# Patient Record
Sex: Female | Born: 1990 | Race: White | Hispanic: No | Marital: Single | Smoking: Never smoker
Health system: Southern US, Community
[De-identification: ages and names within clinical notes are randomized; demographics above are authoritative.]

## PROBLEM LIST (undated history)

## (undated) ENCOUNTER — Inpatient Hospital Stay (HOSPITAL_COMMUNITY): Payer: Self-pay

## (undated) DIAGNOSIS — R011 Cardiac murmur, unspecified: Secondary | ICD-10-CM

## (undated) DIAGNOSIS — F419 Anxiety disorder, unspecified: Secondary | ICD-10-CM

## (undated) DIAGNOSIS — B999 Unspecified infectious disease: Secondary | ICD-10-CM

## (undated) DIAGNOSIS — F32A Depression, unspecified: Secondary | ICD-10-CM

## (undated) HISTORY — PX: NO PAST SURGERIES: SHX2092

## (undated) HISTORY — DX: Depression, unspecified: F32.A

## (undated) HISTORY — PX: WISDOM TOOTH EXTRACTION: SHX21

---
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
## 2009-05-06 ENCOUNTER — Emergency Department (HOSPITAL_COMMUNITY): Admission: EM | Admit: 2009-05-06 | Discharge: 2009-05-06 | Payer: Self-pay | Admitting: Emergency Medicine

## 2009-06-18 ENCOUNTER — Emergency Department (HOSPITAL_BASED_OUTPATIENT_CLINIC_OR_DEPARTMENT_OTHER): Admission: EM | Admit: 2009-06-18 | Discharge: 2009-06-18 | Payer: Self-pay | Admitting: Emergency Medicine

## 2009-10-17 ENCOUNTER — Emergency Department (HOSPITAL_BASED_OUTPATIENT_CLINIC_OR_DEPARTMENT_OTHER): Admission: EM | Admit: 2009-10-17 | Discharge: 2009-10-17 | Payer: Self-pay | Admitting: Emergency Medicine

## 2009-10-17 ENCOUNTER — Ambulatory Visit: Payer: Self-pay | Admitting: Diagnostic Radiology

## 2009-12-18 ENCOUNTER — Emergency Department (HOSPITAL_BASED_OUTPATIENT_CLINIC_OR_DEPARTMENT_OTHER): Admission: EM | Admit: 2009-12-18 | Discharge: 2009-12-18 | Payer: Self-pay | Admitting: Emergency Medicine

## 2010-03-05 ENCOUNTER — Emergency Department (HOSPITAL_BASED_OUTPATIENT_CLINIC_OR_DEPARTMENT_OTHER): Admission: EM | Admit: 2010-03-05 | Discharge: 2010-03-05 | Payer: Self-pay | Admitting: Emergency Medicine

## 2010-07-05 ENCOUNTER — Emergency Department (HOSPITAL_BASED_OUTPATIENT_CLINIC_OR_DEPARTMENT_OTHER): Admission: EM | Admit: 2010-07-05 | Discharge: 2010-07-05 | Payer: Self-pay | Admitting: Emergency Medicine

## 2010-07-10 ENCOUNTER — Emergency Department (HOSPITAL_BASED_OUTPATIENT_CLINIC_OR_DEPARTMENT_OTHER): Admission: EM | Admit: 2010-07-10 | Discharge: 2010-07-10 | Payer: Self-pay | Admitting: Emergency Medicine

## 2010-11-05 LAB — URINALYSIS, ROUTINE W REFLEX MICROSCOPIC
Bilirubin Urine: NEGATIVE
Glucose, UA: NEGATIVE mg/dL
Hgb urine dipstick: NEGATIVE
Ketones, ur: NEGATIVE mg/dL
Nitrite: NEGATIVE
Nitrite: NEGATIVE
Protein, ur: NEGATIVE mg/dL
Specific Gravity, Urine: 1.018 (ref 1.005–1.030)
Specific Gravity, Urine: 1.026 (ref 1.005–1.030)
Urobilinogen, UA: 0.2 mg/dL (ref 0.0–1.0)
Urobilinogen, UA: 0.2 mg/dL (ref 0.0–1.0)
pH: 6.5 (ref 5.0–8.0)

## 2010-11-05 LAB — URINE CULTURE

## 2010-11-05 LAB — PREGNANCY, URINE
Preg Test, Ur: NEGATIVE
Preg Test, Ur: NEGATIVE

## 2010-11-05 LAB — GC/CHLAMYDIA PROBE AMP, GENITAL: Chlamydia, DNA Probe: NEGATIVE

## 2010-11-05 LAB — URINE MICROSCOPIC-ADD ON

## 2010-11-12 LAB — URINALYSIS, ROUTINE W REFLEX MICROSCOPIC
Bilirubin Urine: NEGATIVE
Nitrite: NEGATIVE
Specific Gravity, Urine: 1.024 (ref 1.005–1.030)
Urobilinogen, UA: 0.2 mg/dL (ref 0.0–1.0)

## 2010-11-12 LAB — URINE MICROSCOPIC-ADD ON

## 2010-11-12 LAB — PREGNANCY, URINE: Preg Test, Ur: NEGATIVE

## 2010-11-12 LAB — WET PREP, GENITAL: Trich, Wet Prep: NONE SEEN

## 2010-11-12 LAB — GC/CHLAMYDIA PROBE AMP, GENITAL: Chlamydia, DNA Probe: NEGATIVE

## 2010-11-28 LAB — URINALYSIS, ROUTINE W REFLEX MICROSCOPIC
Bilirubin Urine: NEGATIVE
Glucose, UA: NEGATIVE mg/dL
Hgb urine dipstick: NEGATIVE
Ketones, ur: NEGATIVE mg/dL
Leukocytes, UA: NEGATIVE
Protein, ur: 100 mg/dL — AB

## 2010-11-29 LAB — POCT URINALYSIS DIP (DEVICE)
Bilirubin Urine: NEGATIVE
Glucose, UA: NEGATIVE mg/dL
Ketones, ur: NEGATIVE mg/dL
Specific Gravity, Urine: 1.02 (ref 1.005–1.030)

## 2011-05-16 LAB — DIFFERENTIAL
Lymphocytes Relative: 22 — ABNORMAL LOW
Monocytes Absolute: 0.7
Monocytes Relative: 10
Neutro Abs: 4.5
Neutrophils Relative %: 64

## 2011-05-16 LAB — POCT RAPID STREP A: Streptococcus, Group A Screen (Direct): NEGATIVE

## 2011-05-16 LAB — STREP A DNA PROBE: Group A Strep Probe: NEGATIVE

## 2011-05-16 LAB — CBC
Hemoglobin: 14.3
RBC: 4.8
WBC: 7

## 2011-05-16 LAB — POCT INFECTIOUS MONO SCREEN: Mono Screen: NEGATIVE

## 2011-05-19 LAB — POCT URINALYSIS DIP (DEVICE)
Glucose, UA: NEGATIVE
Hgb urine dipstick: NEGATIVE
Nitrite: NEGATIVE
Operator id: 247071
Protein, ur: 30 — AB
Urobilinogen, UA: 2 — ABNORMAL HIGH

## 2011-05-21 LAB — URINE CULTURE
Colony Count: NO GROWTH
Culture: NO GROWTH

## 2011-05-21 LAB — POCT URINALYSIS DIP (DEVICE)
Glucose, UA: NEGATIVE
Nitrite: NEGATIVE
Operator id: 282151
Protein, ur: 30 — AB
Specific Gravity, Urine: >=1.03
Urobilinogen, UA: 0.2
pH: 5.5

## 2011-05-21 LAB — POCT PREGNANCY, URINE: Operator id: 282151

## 2011-05-22 LAB — POCT URINALYSIS DIP (DEVICE)
Glucose, UA: NEGATIVE
Nitrite: NEGATIVE
Operator id: 247071
Specific Gravity, Urine: 1.02
Urobilinogen, UA: 0.2

## 2011-05-22 LAB — HCG, SERUM, QUALITATIVE: Preg, Serum: NEGATIVE

## 2011-05-22 LAB — POCT PREGNANCY, URINE: Preg Test, Ur: NEGATIVE

## 2011-06-03 LAB — POCT URINALYSIS DIP (DEVICE)
Nitrite: POSITIVE — AB
Urobilinogen, UA: 1
pH: 7

## 2011-06-03 LAB — URINE CULTURE

## 2011-06-03 LAB — POCT PREGNANCY, URINE: Preg Test, Ur: NEGATIVE

## 2011-06-04 LAB — URINE CULTURE: Colony Count: 70000

## 2011-06-04 LAB — POCT URINALYSIS DIP (DEVICE)
Glucose, UA: NEGATIVE
Hgb urine dipstick: NEGATIVE
Nitrite: NEGATIVE
Operator id: 247071
Urobilinogen, UA: 0.2

## 2011-06-04 LAB — URINALYSIS, ROUTINE W REFLEX MICROSCOPIC
Hgb urine dipstick: NEGATIVE
Protein, ur: NEGATIVE
Urobilinogen, UA: 0.2

## 2011-06-04 LAB — POCT PREGNANCY, URINE: Preg Test, Ur: NEGATIVE

## 2011-06-04 LAB — URINE MICROSCOPIC-ADD ON

## 2011-06-05 LAB — POCT URINALYSIS DIP (DEVICE)
Bilirubin Urine: NEGATIVE
Nitrite: NEGATIVE
Protein, ur: NEGATIVE
Urobilinogen, UA: 0.2
pH: 7.5

## 2012-06-16 ENCOUNTER — Ambulatory Visit (INDEPENDENT_AMBULATORY_CARE_PROVIDER_SITE_OTHER): Payer: Self-pay | Admitting: *Deleted

## 2012-06-16 DIAGNOSIS — I781 Nevus, non-neoplastic: Secondary | ICD-10-CM

## 2012-06-16 NOTE — Progress Notes (Signed)
Patient came in wanting sclero. She had 3 little red vessels that were tiny. I suggested she wait until they were bigger and she had more. Follow prn.

## 2012-08-21 ENCOUNTER — Emergency Department (HOSPITAL_BASED_OUTPATIENT_CLINIC_OR_DEPARTMENT_OTHER): Payer: Self-pay

## 2012-08-21 ENCOUNTER — Emergency Department (HOSPITAL_BASED_OUTPATIENT_CLINIC_OR_DEPARTMENT_OTHER)
Admission: EM | Admit: 2012-08-21 | Discharge: 2012-08-21 | Disposition: A | Discharge status: Home / Self Care | Payer: Self-pay | Attending: Emergency Medicine | Admitting: Emergency Medicine

## 2012-08-21 ENCOUNTER — Encounter (HOSPITAL_BASED_OUTPATIENT_CLINIC_OR_DEPARTMENT_OTHER): Payer: Self-pay | Admitting: *Deleted

## 2012-08-21 DIAGNOSIS — J40 Bronchitis, not specified as acute or chronic: Secondary | ICD-10-CM | POA: Insufficient documentation

## 2012-08-21 DIAGNOSIS — B349 Viral infection, unspecified: Secondary | ICD-10-CM

## 2012-08-21 DIAGNOSIS — B9789 Other viral agents as the cause of diseases classified elsewhere: Secondary | ICD-10-CM | POA: Insufficient documentation

## 2012-08-21 DIAGNOSIS — Z3202 Encounter for pregnancy test, result negative: Secondary | ICD-10-CM | POA: Insufficient documentation

## 2012-08-21 LAB — PREGNANCY, URINE: Preg Test, Ur: NEGATIVE

## 2012-08-21 LAB — RAPID STREP SCREEN (MED CTR MEBANE ONLY): Streptococcus, Group A Screen (Direct): NEGATIVE

## 2012-08-21 MED ORDER — ALBUTEROL SULFATE HFA 108 (90 BASE) MCG/ACT IN AERS
2.0000 | INHALATION_SPRAY | RESPIRATORY_TRACT | Status: DC | PRN
Start: 2012-08-21 — End: 2012-08-21
  Administered 2012-08-21: 2 via RESPIRATORY_TRACT
  Filled 2012-08-21: qty 6.7

## 2012-08-21 NOTE — ED Notes (Signed)
Pt states she got sick at Christmas and is losing her voice now. Taking Mucinex, but throat feels dry now. Cough, congestion as well.

## 2012-08-21 NOTE — ED Provider Notes (Signed)
History   This chart was scribed for Carrie Chick, MD scribed by Magnus Sinning. The patient was seen in room MH05/MH05 at 15:20    CSN: [id number]  Arrival date & time 08/21/12  1450     Chief Complaint  Patient presents with  . Sore Throat    (Consider location/radiation/quality/duration/timing/severity/associated sxs/prior treatment) HPI Comments: Carrie [name] is a 21 y.o. female who presents to the Emergency Department complaining of gradually worsening moderate ST with associated unproductive cough, watery eyes, and congestion, all onset 3 days ago. Pt provides that she has been sick for the past three days and that she believes her ST has been worsened with continued Mucinex cough syrup use. She says that she was initially having a productive cough, but states it has since become very dry.  Reports previous associated subjective fever that she says has since resolved after tylenol. Also provides that she took some of mother's amoxil rx with no relief of sxs.  Pt denies hx of asthma or smoking.   Patient is a 21 y.o. female presenting with pharyngitis. The history is provided by the patient. No language interpreter was used.  Sore Throat This is a new problem. The current episode started more than 2 days ago. The problem occurs constantly. The problem has been gradually worsening. The symptoms are relieved by medications. She has tried acetaminophen for the symptoms. The treatment provided moderate relief.    History reviewed. No pertinent past medical history.  History reviewed. No pertinent past surgical history.  History reviewed. No pertinent family history.  History  Substance Use Topics  . Smoking status: Never Smoker   . Smokeless tobacco: Not on file  . Alcohol Use: No    Review of Systems  HENT: Positive for congestion.   Respiratory: Positive for cough.   All other systems reviewed and are negative.    Allergies  Codeine  Home Medications  No  current outpatient prescriptions on file.  BP 97/64  Pulse 94  Temp 98.2 F (36.8 C) (Oral)  Resp 16  Ht 5\' 5"  (1.651 m)  Wt 135 lb (61.236 kg)  BMI 22.47 kg/m2  SpO2 97%  LMP 08/11/2012  Physical Exam  Nursing note and vitals reviewed. Constitutional: She is oriented to person, place, and time. She appears well-developed and well-nourished. No distress.       Well-appearing and well-hydrated  HENT:  Head: Normocephalic and atraumatic.  Mouth/Throat: Oropharynx is clear and moist.       Throat is not red  Eyes: Conjunctivae normal and EOM are normal.  Neck: Neck supple. No tracheal deviation present.  Cardiovascular: Normal rate, regular rhythm and normal heart sounds.   Pulmonary/Chest: Effort normal and breath sounds normal. No respiratory distress. She has no wheezes. She has no rales.  Abdominal: Soft.  Musculoskeletal: Normal range of motion. She exhibits no edema.  Neurological: She is alert and oriented to person, place, and time. No sensory deficit.  Skin: Skin is warm and dry.  Psychiatric: She has a normal mood and affect. Her behavior is normal.    ED Course  Procedures (including critical care time) DIAGNOSTIC STUDIES: Oxygen Saturation is 97% on room air, normal by my interpretation.    COORDINATION OF CARE: 15:21: Physical exam performed  Labs Reviewed  PREGNANCY, URINE  RAPID STREP SCREEN   No results found.   1. Viral infection   2. Bronchitis       MDM  Pt presenting with c/o nasal congestion, dry  throat, hoarse voice, nonproductive cough.  She has been taking mucinex  With some relief.  She has negative rapid strep.  She has declined to have CXR performed- and I have a relatively low suspicion for pneumonia, rather suspect viral infection/bronchitis and have recommended continuing symptomatic care.  Given albuterol inhaler.  Discharged with strict return precautions.  Pt agreeable with plan.   I personally performed the services described in  this documentation, which was scribed in my presence. The recorded information has been reviewed and is accurate.        Carrie Chick, MD 08/21/12 [phone]

## 2012-08-26 ENCOUNTER — Encounter (HOSPITAL_BASED_OUTPATIENT_CLINIC_OR_DEPARTMENT_OTHER): Payer: Self-pay | Admitting: *Deleted

## 2012-08-26 ENCOUNTER — Emergency Department (HOSPITAL_BASED_OUTPATIENT_CLINIC_OR_DEPARTMENT_OTHER): Payer: Self-pay

## 2012-08-26 DIAGNOSIS — R05 Cough: Secondary | ICD-10-CM | POA: Insufficient documentation

## 2012-08-26 DIAGNOSIS — R059 Cough, unspecified: Secondary | ICD-10-CM | POA: Insufficient documentation

## 2012-08-26 NOTE — ED Notes (Signed)
Pt c/o cough x1 week. She was seen here previously and given an inhaler.

## 2012-08-27 ENCOUNTER — Emergency Department (HOSPITAL_BASED_OUTPATIENT_CLINIC_OR_DEPARTMENT_OTHER)
Admission: EM | Admit: 2012-08-27 | Discharge: 2012-08-27 | Discharge status: Against Medical Advice | Payer: Self-pay | Attending: Emergency Medicine | Admitting: Emergency Medicine

## 2012-08-27 NOTE — ED Notes (Signed)
Pt called to be taken to a room but did not answer. Registration staff stated that pt had just left. Pt spoken to in parking lot and advised me that she was leaving. Pt ambulatory with quick and steady gait. NAD noted.

## 2012-08-27 NOTE — ED Notes (Signed)
Patient called to request results of her chest xray that was done last night prior to her leaving against medical advice.  Chart reviewed with Dr. Judd Lien, OK to call patient and let her know that her xray was normal.  Call placed to pt, verbalized understanding.

## 2012-10-11 ENCOUNTER — Encounter (HOSPITAL_COMMUNITY): Payer: Self-pay | Admitting: *Deleted

## 2012-10-11 ENCOUNTER — Emergency Department (INDEPENDENT_AMBULATORY_CARE_PROVIDER_SITE_OTHER)
Admission: EM | Admit: 2012-10-11 | Discharge: 2012-10-11 | Disposition: A | Discharge status: Home / Self Care | Payer: Self-pay | Source: Home / Self Care | Attending: Emergency Medicine | Admitting: Emergency Medicine

## 2012-10-11 ENCOUNTER — Emergency Department (INDEPENDENT_AMBULATORY_CARE_PROVIDER_SITE_OTHER): Payer: Self-pay

## 2012-10-11 DIAGNOSIS — R05 Cough: Secondary | ICD-10-CM

## 2012-10-11 DIAGNOSIS — R059 Cough, unspecified: Secondary | ICD-10-CM

## 2012-10-11 MED ORDER — HYDROCODONE-HOMATROPINE 5-1.5 MG/5ML PO SYRP: 5.0000 mL | ORAL_SOLUTION | Freq: Four times a day (QID) | ORAL | Status: DC | PRN

## 2012-10-11 MED ORDER — PREDNISONE 10 MG PO TABS: ORAL_TABLET | ORAL | Status: DC

## 2012-10-11 NOTE — ED Provider Notes (Signed)
History     CSN: [id number]  Arrival date & time 10/11/12  1105   First MD Initiated Contact with Patient 10/11/12 1148      Chief Complaint  Patient presents with  . Cough    (Consider location/radiation/quality/duration/timing/severity/associated sxs/prior treatment) Patient is a 22 y.o. female presenting with cough. The history is provided by the patient. No language interpreter was used.  Cough Cough characteristics:  Non-productive Severity:  Moderate Onset quality:  Gradual Timing:  Constant Progression:  Worsening Chronicity:  Chronic Smoker: no   Relieved by:  Nothing Worsened by:  Nothing tried Ineffective treatments:  Beta-agonist inhaler (amoxicillian, augmentin, otc medications) Associated symptoms: fever and shortness of breath   Associated symptoms: no chest pain     History reviewed. No pertinent past medical history.  History reviewed. No pertinent past surgical history.  History reviewed. No pertinent family history.  History  Substance Use Topics  . Smoking status: Never Smoker   . Smokeless tobacco: Not on file  . Alcohol Use: No    OB History   Grav Para Term Preterm Abortions TAB SAB Ect Mult Living                  Review of Systems  Constitutional: Positive for fever.  Respiratory: Positive for cough and shortness of breath.   Cardiovascular: Negative for chest pain.  All other systems reviewed and are negative.    Allergies  Augmentin and Codeine  Home Medications  No current outpatient prescriptions on file.  BP 105/69  Pulse 68  Temp(Src) 98.9 F (37.2 C) (Oral)  Resp 16  SpO2 98%  LMP 10/10/2012  Physical Exam  Nursing note and vitals reviewed. Constitutional: She is oriented to person, place, and time. She appears well-developed and well-nourished.  HENT:  Head: Normocephalic.  Right Ear: External ear normal.  Left Ear: External ear normal.  Nose: Nose normal.  Mouth/Throat: Oropharynx is clear and moist.   Eyes: Conjunctivae and EOM are normal. Pupils are equal, round, and reactive to light.  Neck: Normal range of motion. Neck supple.  Cardiovascular: Normal rate and normal heart sounds.   Pulmonary/Chest: Effort normal and breath sounds normal.  Abdominal: Soft. Bowel sounds are normal.  Musculoskeletal: Normal range of motion.  Neurological: She is alert and oriented to person, place, and time. She has normal reflexes.  Skin: Skin is warm.  Psychiatric: She has a normal mood and affect.    ED Course  Procedures (including critical care time)  Labs Reviewed - No data to display Dg Chest 2 View  10/11/2012  *RADIOLOGY REPORT*  Clinical Data: Cough.  CHEST - 2 VIEW  Comparison: Chest x-ray 08/26/2012.  Findings: Lung volumes are normal.  No consolidative airspace disease.  No pleural effusions.  No pneumothorax.  No pulmonary nodule or mass noted.  Pulmonary vasculature and the cardiomediastinal silhouette are within normal limits.  IMPRESSION: 1. No radiographic evidence of acute cardiopulmonary disease.   Original Report Authenticated By: Trudie Reed, M.D.      1. Cough       MDM  Chest xray is normal.  I will try pt on prednisone.   (Pt has been taking amoxicillian from a [place] store and augmentin of her Mothers.  Pt probably has a post viral cough.        Lonia Skinner [place], [place] 10/11/12 1409  Lonia Skinner [name], [place] 10/11/12 [phone]

## 2012-10-11 NOTE — ED Provider Notes (Signed)
Medical screening examination/treatment/procedure(s) were performed by non-physician practitioner and as supervising physician I was immediately available for consultation/collaboration.  Buffy Ehler, M.D.  Kelvin Sennett C Sadia Belfiore, MD 10/11/12 2138 

## 2012-10-11 NOTE — ED Notes (Signed)
Pt reports cough for the past two months with no relief from otc meds - productive with yellow sputum - denies any other complaints

## 2013-04-27 ENCOUNTER — Emergency Department (HOSPITAL_BASED_OUTPATIENT_CLINIC_OR_DEPARTMENT_OTHER)
Admission: EM | Admit: 2013-04-27 | Discharge: 2013-04-27 | Disposition: A | Discharge status: Home / Self Care | Payer: Medicaid Other | Attending: Emergency Medicine | Admitting: Emergency Medicine

## 2013-04-27 ENCOUNTER — Encounter (HOSPITAL_BASED_OUTPATIENT_CLINIC_OR_DEPARTMENT_OTHER): Payer: Self-pay | Admitting: Emergency Medicine

## 2013-04-27 DIAGNOSIS — L309 Dermatitis, unspecified: Secondary | ICD-10-CM

## 2013-04-27 DIAGNOSIS — L259 Unspecified contact dermatitis, unspecified cause: Secondary | ICD-10-CM | POA: Insufficient documentation

## 2013-04-27 MED ORDER — SELENIUM SULFIDE 2.5 % EX LOTN: TOPICAL_LOTION | Freq: Every day | CUTANEOUS | Status: DC | PRN

## 2013-04-27 MED ORDER — PREDNISONE 10 MG PO TABS: 20.0000 mg | ORAL_TABLET | Freq: Two times a day (BID) | ORAL | Status: DC

## 2013-04-27 NOTE — ED Notes (Signed)
Diffuse rash on arms and neck x 1 1/2 wks.  Took a Prednisone dosepack of her mothers but it did not help.  Zyrtec x3 days.  Took Benadryl last night. Denies itching or pain.

## 2013-04-27 NOTE — ED Provider Notes (Signed)
CSN: [id number]     Arrival date & time 04/27/13  1245 History   First MD Initiated Contact with Patient 04/27/13 1356     Chief Complaint  Patient presents with  . Rash   (Consider location/radiation/quality/duration/timing/severity/associated sxs/prior Treatment) HPI Comments: Patient presents with complaints of rash to the anterior neck that has been present for the past week and a half. She works as a Clinical research associate but denies having used any new or different creams or lotions. The rash does not itch. Her mother had a leftover prednisone taper which she took and finished 5 days ago but did not make things better.  Patient is a 22 y.o. female presenting with rash. The history is provided by the patient.  Rash Location:  Head/neck Head/neck rash location: Neck. Quality: dryness   Severity:  Moderate Onset quality:  Gradual Duration:  1 week Timing:  Constant Progression:  Worsening Chronicity:  New   History reviewed. No pertinent past medical history. History reviewed. No pertinent past surgical history. No family history on file. History  Substance Use Topics  . Smoking status: Never Smoker   . Smokeless tobacco: Not on file  . Alcohol Use: No   OB History   Grav Para Term Preterm Abortions TAB SAB Ect Mult Living                 Review of Systems  Skin: Positive for rash.  All other systems reviewed and are negative.    Allergies  Augmentin and Codeine  Home Medications   Current Outpatient Rx  Name  Route  Sig  Dispense  Refill  . HYDROcodone-homatropine (HYCODAN) 5-1.5 MG/5ML syrup   Oral   Take 5 mLs by mouth every 6 (six) hours as needed for cough.   120 mL   0   . predniSONE (DELTASONE) 10 MG tablet      6,5,4,3,2,1 taper   21 tablet   0    BP 110/71  Pulse 70  Temp(Src) 98.5 F (36.9 C) (Oral)  Resp 16  Ht 5\' 5"  (1.651 m)  Wt 145 lb (65.772 kg)  BMI 24.13 kg/m2  LMP 04/03/2013 Physical Exam  Nursing note and vitals  reviewed. Constitutional: She is oriented to person, place, and time. She appears well-developed and well-nourished. No distress.  HENT:  Head: Normocephalic and atraumatic.  Neck: Normal range of motion. Neck supple.  Neurological: She is alert and oriented to person, place, and time.  Skin: She is not diaphoretic.  There is a fine, hypopigmented rash to the anterior neck it is not urticarial.    ED Course  Procedures (including critical care time) Labs Review Labs Reviewed - No data to display Imaging Review No results found.  MDM  No diagnosis found. I am uncertain of the exact etiology of this rash however I believe it is possible that this could be tinea versicolor. I will treat with selenium sulfide shampoo and she is requesting a repeat dose of prednisone which I will provide. I advised her if this treatment does not make this better or if she worsens she would be best served to followup with a dermatologist to discuss this further.    Geoffery Lyons, MD 04/27/13 [phone]

## 2014-03-10 ENCOUNTER — Emergency Department (HOSPITAL_BASED_OUTPATIENT_CLINIC_OR_DEPARTMENT_OTHER)
Admission: EM | Admit: 2014-03-10 | Discharge: 2014-03-10 | Disposition: A | Discharge status: Home / Self Care | Payer: Medicaid Other | Attending: Emergency Medicine | Admitting: Emergency Medicine

## 2014-03-10 ENCOUNTER — Encounter (HOSPITAL_BASED_OUTPATIENT_CLINIC_OR_DEPARTMENT_OTHER): Payer: Self-pay | Admitting: Emergency Medicine

## 2014-03-10 DIAGNOSIS — R5381 Other malaise: Secondary | ICD-10-CM | POA: Insufficient documentation

## 2014-03-10 DIAGNOSIS — IMO0002 Reserved for concepts with insufficient information to code with codable children: Secondary | ICD-10-CM | POA: Insufficient documentation

## 2014-03-10 DIAGNOSIS — Z79899 Other long term (current) drug therapy: Secondary | ICD-10-CM | POA: Insufficient documentation

## 2014-03-10 DIAGNOSIS — R5383 Other fatigue: Secondary | ICD-10-CM

## 2014-03-10 DIAGNOSIS — J0101 Acute recurrent maxillary sinusitis: Secondary | ICD-10-CM

## 2014-03-10 DIAGNOSIS — J0100 Acute maxillary sinusitis, unspecified: Secondary | ICD-10-CM | POA: Insufficient documentation

## 2014-03-10 LAB — RAPID STREP SCREEN (MED CTR MEBANE ONLY): STREPTOCOCCUS, GROUP A SCREEN (DIRECT): NEGATIVE

## 2014-03-10 MED ORDER — FLUCONAZOLE 150 MG PO TABS: 150.0000 mg | ORAL_TABLET | Freq: Once | ORAL | Status: DC

## 2014-03-10 MED ORDER — AZITHROMYCIN 250 MG PO TABS: 250.0000 mg | ORAL_TABLET | Freq: Every day | ORAL | Status: DC

## 2014-03-10 MED ORDER — BENZONATATE 100 MG PO CAPS: 100.0000 mg | ORAL_CAPSULE | Freq: Three times a day (TID) | ORAL | Status: DC

## 2014-03-10 MED ORDER — FLUTICASONE PROPIONATE 50 MCG/ACT NA SUSP: 1.0000 | Freq: Every day | NASAL | Status: DC

## 2014-03-10 NOTE — Discharge Instructions (Signed)
Sinusitis Sinusitis is redness, soreness, and swelling (inflammation) of the paranasal sinuses. Paranasal sinuses are air pockets within the bones of your face (beneath the eyes, the middle of the forehead, or above the eyes). In healthy paranasal sinuses, mucus is able to drain out, and air is able to circulate through them by way of your nose. However, when your paranasal sinuses are inflamed, mucus and air can become trapped. This can allow bacteria and other germs to grow and cause infection. Sinusitis can develop quickly and last only a short time (acute) or continue over a long period (chronic). Sinusitis that lasts for more than 12 weeks is considered chronic.  CAUSES  Causes of sinusitis include:  Allergies.  Structural abnormalities, such as displacement of the cartilage that separates your nostrils (deviated septum), which can decrease the air flow through your nose and sinuses and affect sinus drainage.  Functional abnormalities, such as when the small hairs (cilia) that line your sinuses and help remove mucus do not work properly or are not present. SYMPTOMS  Symptoms of acute and chronic sinusitis are the same. The primary symptoms are pain and pressure around the affected sinuses. Other symptoms include:  Upper toothache.  Earache.  Headache.  Bad breath.  Decreased sense of smell and taste.  A cough, which worsens when you are lying flat.  Fatigue.  Fever.  Thick drainage from your nose, which often is green and may contain pus (purulent).  Swelling and warmth over the affected sinuses. DIAGNOSIS  Your caregiver will perform a physical exam. During the exam, your caregiver may:  Look in your nose for signs of abnormal growths in your nostrils (nasal polyps).  Tap over the affected sinus to check for signs of infection.  View the inside of your sinuses (endoscopy) with a special imaging device with a light attached (endoscope), which is inserted into your  sinuses. If your caregiver suspects that you have chronic sinusitis, one or more of the following tests may be recommended:  Allergy tests.  Nasal culture--A sample of mucus is taken from your nose and sent to a lab and screened for bacteria.  Nasal cytology--A sample of mucus is taken from your nose and examined by your caregiver to determine if your sinusitis is related to an allergy. TREATMENT  Most cases of acute sinusitis are related to a viral infection and will resolve on their own within 10 days. Sometimes medicines are prescribed to help relieve symptoms (pain medicine, decongestants, nasal steroid sprays, or saline sprays).  However, for sinusitis related to a bacterial infection, your caregiver will prescribe antibiotic medicines. These are medicines that will help kill the bacteria causing the infection.  Rarely, sinusitis is caused by a fungal infection. In theses cases, your caregiver will prescribe antifungal medicine. For some cases of chronic sinusitis, surgery is needed. Generally, these are cases in which sinusitis recurs more than 3 times per year, despite other treatments. HOME CARE INSTRUCTIONS   Drink plenty of water. Water helps thin the mucus so your sinuses can drain more easily.  Use a humidifier.  Inhale steam 3 to 4 times a day (for example, sit in the bathroom with the shower running).  Apply a warm, moist washcloth to your face 3 to 4 times a day, or as directed by your caregiver.  Use saline nasal sprays to help moisten and clean your sinuses.  Take over-the-counter or prescription medicines for pain, discomfort, or fever only as directed by your caregiver. SEEK IMMEDIATE MEDICAL CARE IF:    You have increasing pain or severe headaches.  You have nausea, vomiting, or drowsiness.  You have swelling around your face.  You have vision problems.  You have a stiff neck.  You have difficulty breathing. MAKE SURE YOU:   Understand these  instructions.  Will watch your condition.  Will get help right away if you are not doing well or get worse. Document Released: 08/11/2005 Document Revised: 11/03/2011 Document Reviewed: 08/26/2011 Exit[employer] Patient Information 2015 [employer]. This information is not intended to replace advice given to you by your health care provider. Make sure you discuss any questions you have with your health care provider.  

## 2014-03-10 NOTE — ED Provider Notes (Signed)
CSN: [id number]     Arrival date & time 03/10/14  2110 History  This chart was scribed for Carrie [name], * by Phillis Haggis, ED Scribe. This patient was seen in room MH12/MH12 and patient care was started at 9:26 PM.    No chief complaint on file.  The history is provided by the patient. No language interpreter was used.   HPI Comments: Carrie [name] is a 23 y.o. female who presents to the Emergency Department complaining of sore throat onset 20 minutes ago. She states that she noticed bumps on the back of her throat. She reports that she has been on medication for a yeast infection off and on for the past 6 months. She reports that she has a cut on her tonsils. She reports associated fatigue, coughing and drainage in her throat for the past 2 months. She reports the cough started when she started taking the medication. She reports that her house water has changed to well water which she was told was contaminated. She reports that she has been having intermittent vaginal discharge for the past 6 months.   No past medical history on file. No past surgical history on file. No family history on file. History  Substance Use Topics  . Smoking status: Never Smoker   . Smokeless tobacco: Not on file  . Alcohol Use: No   OB History   Grav Para Term Preterm Abortions TAB SAB Ect Mult Living                 Review of Systems  Constitutional: Positive for fatigue.  HENT: Positive for sore throat.   Respiratory: Positive for cough.   All other systems reviewed and are negative.  Allergies  Augmentin and Codeine  Home Medications   Prior to Admission medications   Medication Sig Start Date End Date Taking? Authorizing Provider  predniSONE (DELTASONE) 10 MG tablet 6,5,4,3,2,1 taper 10/11/12   Elson Areas, PA-C  predniSONE (DELTASONE) 10 MG tablet Take 2 tablets (20 mg total) by mouth 2 (two) times daily. 04/27/13   Geoffery Lyons, MD  selenium sulfide (SELSUN) 2.5 % shampoo Apply  topically daily as needed for itching. 04/27/13   Geoffery Lyons, MD   BP 115/76  Pulse 73  Temp(Src) 98.1 F (36.7 C) (Oral)  Resp 18  Ht 5\' 5"  (1.651 m)  Wt 160 lb (72.576 kg)  BMI 26.63 kg/m2  SpO2 100% Physical Exam  Constitutional: She is oriented to person, place, and time. She appears well-developed and well-nourished. No distress.  HENT:  Head: Normocephalic and atraumatic.  Right Ear: Hearing normal.  Left Ear: Hearing normal.  Nose: Nose normal.  Mouth/Throat: Oropharynx is clear and moist and mucous membranes are normal.  Eyes: Conjunctivae and EOM are normal. Pupils are equal, round, and reactive to light.  Neck: Normal range of motion. Neck supple.  Cardiovascular: Regular rhythm, S1 normal and S2 normal.  Exam reveals no gallop and no friction rub.   No murmur heard. Pulmonary/Chest: Effort normal and breath sounds normal. No respiratory distress. She exhibits no tenderness.  Abdominal: Soft. Normal appearance and bowel sounds are normal. There is no hepatosplenomegaly. There is no tenderness. There is no rebound, no guarding, no tenderness at McBurney's point and negative Murphy's sign. No hernia.  Musculoskeletal: Normal range of motion.  Neurological: She is alert and oriented to person, place, and time. She has normal strength. No cranial nerve deficit or sensory deficit. Coordination normal. GCS eye subscore is 4.  GCS verbal subscore is 5. GCS motor subscore is 6.  Skin: Skin is warm, dry and intact. No rash noted. No cyanosis.  Psychiatric: She has a normal mood and affect. Her speech is normal and behavior is normal. Thought content normal.   ED Course  Procedures (including critical care time) DIAGNOSTIC STUDIES: Oxygen Saturation is 100% on room air, normal by my interpretation.    COORDINATION OF CARE: 9:32 PM-Discussed treatment plan which includes labs with pt at bedside and pt agreed to plan.   Labs Review Labs Reviewed  RAPID STREP SCREEN   Imaging  Review No results found.   EKG Interpretation None      MDM   Final diagnoses:  None  Sinusitis  She has multiple complaints. Patient complaining of sinus congestion and yellowish brown drainage for 2 or 3 months. She has developed a sore throat and tonight noticed that she had "bumps" on the back of her tongue. Patient reports that she just took a Diflucan and Flagyl, given to her by her mother, for which she thinks might be a yeast infection. Patient reports that for the last 6 months she has been having intermittent vaginal discharge and irritation. She declines pelvic exam.  Oral pharyngeal examination reveals no significant findings. The bumps she has seen our vallecula. There is no redness. There is no tonsillar enlargement. Strep was negative, culture pending.  Patient will be treated for sinusitis, followup with ENT for her multiple head and throat issues.   I personally performed the services described in this documentation, which was scribed in my presence. The recorded information has been reviewed and is accurate.     Carrie Creasehristopher J. Pollina, MD 03/10/14 2159

## 2014-03-10 NOTE — ED Notes (Signed)
States she has bumps in the back of her throat. No pain. Cough.

## 2014-03-12 LAB — CULTURE, GROUP A STREP

## 2014-04-06 ENCOUNTER — Encounter (HOSPITAL_COMMUNITY): Payer: Self-pay | Admitting: Emergency Medicine

## 2014-04-06 ENCOUNTER — Emergency Department (HOSPITAL_COMMUNITY)
Admission: EM | Admit: 2014-04-06 | Discharge: 2014-04-06 | Disposition: A | Discharge status: Home / Self Care | Payer: Medicaid Other | Attending: Emergency Medicine | Admitting: Emergency Medicine

## 2014-04-06 ENCOUNTER — Emergency Department (HOSPITAL_COMMUNITY): Payer: Medicaid Other

## 2014-04-06 DIAGNOSIS — O200 Threatened abortion: Secondary | ICD-10-CM | POA: Insufficient documentation

## 2014-04-06 DIAGNOSIS — O209 Hemorrhage in early pregnancy, unspecified: Secondary | ICD-10-CM | POA: Diagnosis present

## 2014-04-06 LAB — WET PREP, GENITAL
Trich, Wet Prep: NONE SEEN
YEAST WET PREP: NONE SEEN

## 2014-04-06 LAB — TYPE AND SCREEN
ABO/RH(D): O POS
Antibody Screen: NEGATIVE

## 2014-04-06 LAB — CBC
HCT: 41.2 % (ref 36.0–46.0)
Hemoglobin: 14 g/dL (ref 12.0–15.0)
MCH: 30.4 pg (ref 26.0–34.0)
MCHC: 34 g/dL (ref 30.0–36.0)
MCV: 89.6 fL (ref 78.0–100.0)
Platelets: 217 10*3/uL (ref 150–400)
RBC: 4.6 MIL/uL (ref 3.87–5.11)
RDW: 12.7 % (ref 11.5–15.5)
WBC: 6.1 10*3/uL (ref 4.0–10.5)

## 2014-04-06 LAB — POC URINE PREG, ED: Preg Test, Ur: POSITIVE — AB

## 2014-04-06 LAB — HCG, QUANTITATIVE, PREGNANCY: hCG, Beta Chain, Quant, S: 374 m[IU]/mL — ABNORMAL HIGH (ref <5)

## 2014-04-06 LAB — ABO/RH: ABO/RH(D): O POS

## 2014-04-06 MED ORDER — PRENATAL COMPLETE 14-0.4 MG PO TABS: 1.0000 | ORAL_TABLET | Freq: Every day | ORAL | Status: DC

## 2014-04-06 NOTE — Discharge Instructions (Signed)
Your laboratory testing and ultrasound tests have not shown any signs for a concerning or emergent cause of your symptoms at this time. Please followup with an OB/GYN specialist in the next 2 weeks to have a recheck of your hormone levels and possible repeat ultrasound.   Vaginal Bleeding During Pregnancy, First Trimester A small amount of bleeding (spotting) from the vagina is common in early pregnancy. Sometimes the bleeding is normal and is not a problem, and sometimes it is a sign of something serious. Be sure to tell your doctor about any bleeding from your vagina right away. HOME CARE  Watch your condition for any changes.  Follow your doctor's instructions about how active you can be.  If you are on bed rest:  You may need to stay in bed and only get up to use the bathroom.  You may be allowed to do some activities.  If you need help, make plans for someone to help you.  Write down:  The number of pads you use each day.  How often you change pads.  How soaked (saturated) your pads are.  Do not use tampons.  Do not douche.  Do not have sex or orgasms until your doctor says it is okay.  If you pass any tissue from your vagina, save the tissue so you can show it to your doctor.  Only take medicines as told by your doctor.  Do not take aspirin because it can make you bleed.  Keep all follow-up visits as told by your doctor. GET HELP IF:   You bleed from your vagina.  You have cramps.  You have labor pains.  You have a fever that does not go away after you take medicine. GET HELP RIGHT AWAY IF:   You have very bad cramps in your back or belly (abdomen).  You pass large clots or tissue from your vagina.  You bleed more.  You feel light-headed or weak.  You pass out (faint).  You have chills.  You are leaking fluid or have a gush of fluid from your vagina.  You pass out while pooping (having a bowel movement). MAKE SURE YOU:  Understand these  instructions.  Will watch your condition.  Will get help right away if you are not doing well or get worse. Document Released: 12/26/2013 Document Reviewed: 04/18/2013 [facility] Patient Information 2015 [place], [place]. This information is not intended to replace advice given to you by your health care provider. Make sure you discuss any questions you have with your health care provider.     Threatened Miscarriage A threatened miscarriage is when you have vaginal bleeding during your first 20 weeks of pregnancy but the pregnancy has not ended. Your doctor will do tests to make sure you are still pregnant. The cause of the bleeding may not be known. This condition does not mean your pregnancy will end. It does increase the risk of it ending (complete miscarriage). HOME CARE   Make sure you keep all your doctor visits for prenatal care.  Get plenty of rest.  Do not have sex or use tampons if you have vaginal bleeding.  Do not douche.  Do not smoke or use drugs.  Do not drink alcohol.  Avoid caffeine. GET HELP IF:  You have light bleeding from your vagina.  You have belly pain or cramping.  You have a fever. GET HELP RIGHT AWAY IF:   You have heavy bleeding from your vagina.  You have clots of blood coming  from your vagina.  You have bad pain or cramps in your low back or belly.  You have fever, chills, and bad belly pain. MAKE SURE YOU:   Understand these instructions.  Will watch your condition.  Will get help right away if you are not doing well or get worse. Document Released: 07/24/2008 Document Revised: 08/16/2013 Document Reviewed: 06/07/2013 [facility] Patient Information 2015 [place], [place]. This information is not intended to replace advice given to you by your health care provider. Make sure you discuss any questions you have with your health care provider.

## 2014-04-06 NOTE — ED Provider Notes (Signed)
CSN: [id number]     Arrival date & time 04/06/14  1709 History   First MD Initiated Contact with Patient 04/06/14 2015     Chief Complaint  Patient presents with  . Vaginal Bleeding   HPI  History provided by the patient. Patient is a 23 year old female with no significant PMH presenting with irregular vaginal bleeding and possible early pregnancy. Patient states that she had a normal menstrual cycle began July 25 and ended August 1. This was slightly late than expected but otherwise usual. Since that time she was doing well until she began having some irregular vaginal bleeding several days later. This continued daily. The [facility] and found that she had a positive pregnancy test. She denies any other associated symptoms. No significant abdominal pains. No lightheadedness or near syncope. She does not use birth control and is sexually active. She denies any previous history of STDs.    History reviewed. No pertinent past medical history. History reviewed. No pertinent past surgical history. History reviewed. No pertinent family history. History  Substance Use Topics  . Smoking status: Never Smoker   . Smokeless tobacco: Not on file  . Alcohol Use: No   OB History   Grav Para Term Preterm Abortions TAB SAB Ect Mult Living                 Review of Systems  Constitutional: Negative for fever, chills and diaphoresis.  Gastrointestinal: Negative for nausea, vomiting and abdominal pain.  Genitourinary: Positive for vaginal bleeding. Negative for dysuria, frequency and hematuria.  All other systems reviewed and are negative.     Allergies  Augmentin and Codeine  Home Medications   Prior to Admission medications   Medication Sig Start Date End Date Taking? Authorizing Provider  aspirin-acetaminophen-caffeine (EXCEDRIN MIGRAINE) [id number] MG per tablet Take 2 tablets by mouth daily as needed for headache.   Yes Historical Provider, MD   BP 103/62  Pulse  77  Temp(Src) 98.8 F (37.1 C) (Oral)  Resp 18  SpO2 97%  LMP 03/18/2014 Physical Exam  Nursing note and vitals reviewed. Constitutional: She is oriented to person, place, and time. She appears well-developed and well-nourished. No distress.  HENT:  Head: Normocephalic.  Cardiovascular: Normal rate and regular rhythm.   Pulmonary/Chest: Effort normal and breath sounds normal. No respiratory distress.  Abdominal: Soft. There is no tenderness. There is no rebound and no guarding.  Genitourinary:  Chaperone was present. There is some bleeding present. Cervix closed. No products of conception. No tenderness.  Neurological: She is alert and oriented to person, place, and time.  Skin: Skin is warm and dry. No rash noted.  Psychiatric: She has a normal mood and affect. Her behavior is normal.    ED Course  Procedures    COORDINATION OF CARE:  Nursing notes reviewed. Vital signs reviewed. Initial pt interview and examination performed.   Filed Vitals:   04/06/14 1715 04/06/14 1929 04/06/14 1930 04/06/14 2015  BP: 105/71 117/78 117/78 103/62  Pulse: 87 83 83 77  Temp: 98.8 F (37.1 C)     TempSrc: Oral     Resp: 18 18    SpO2: 97% 100% 100% 97%    9:08 PM-patient seen and evaluated. Patient appears well no acute distress. Discussed workup planned for early pregnancy. Patient agrees.  Laboratory testing and ultrasound tests unremarkable. No evidence of ectopic pregnancy at this time. Pregnancies early and there is also no evidence of IUP. Patient instructed to followup  with OB/GYN in the next 2 weeks. Strict return precautions given. Patient agrees with plan.    Results for orders placed during the hospital encounter of 04/06/14  WET PREP, GENITAL      Result Value Ref Range   Yeast Wet Prep HPF POC NONE SEEN  NONE SEEN   Trich, Wet Prep NONE SEEN  NONE SEEN   Clue Cells Wet Prep HPF POC FEW (*) NONE SEEN   WBC, Wet Prep HPF POC FEW (*) NONE SEEN  CBC      Result Value Ref  Range   WBC 6.1  4.0 - 10.5 K/uL   RBC 4.60  3.87 - 5.11 MIL/uL   Hemoglobin 14.0  12.0 - 15.0 g/dL   HCT 40.9  81.1 - 91.4 %   MCV 89.6  78.0 - 100.0 fL   MCH 30.4  26.0 - 34.0 pg   MCHC 34.0  30.0 - 36.0 g/dL   RDW 78.2  95.6 - 21.3 %   Platelets 217  150 - 400 K/uL  HCG, QUANTITATIVE, PREGNANCY      Result Value Ref Range   hCG, Beta Chain, Quant, S 374 (*) <5 mIU/mL  POC URINE PREG, ED      Result Value Ref Range   Preg Test, Ur POSITIVE (*) NEGATIVE  ABO/RH      Result Value Ref Range   ABO/RH(D) O POS    TYPE AND SCREEN      Result Value Ref Range   ABO/RH(D) O POS     Antibody Screen NEG     Sample Expiration 04/09/2014      Imaging Review US Ob Comp Less 14 Wks  04/06/2014   CLINICAL DATA:  Vaginal bleeding for 2-3 weeks.  EXAM: OBSTETRIC <14 WK [place] AND TRANSVAGINAL OB US  TECHNIQUE: Both transabdominal and transvaginal ultrasound examinations were performed for complete evaluation of the gestation as well as the maternal uterus, adnexal regions, and pelvic cul-de-sac. Transvaginal technique was performed to assess early pregnancy.  COMPARISON:  None.  FINDINGS: Intrauterine gestational sac:  None seen.  Yolk sac:  N/A  Embryo:  N/A  Maternal uterus/adnexae: The uterus is unremarkable in appearance.  The ovaries are within normal limits. The right ovary measures 4.3 x 2.9 x 2.7 cm, while the left ovary measures 3.7 x 2.3 x 3.5 cm. No suspicious adnexal masses are seen  Trace free fluid is seen within the pelvic cul-de-sac.  IMPRESSION: No intrauterine gestational sac seen. No evidence for ectopic pregnancy at this time. Given the quantitative beta HCG level of 374, it remains too early to visualize a gestational sac. If the quantitative beta HCG levels continue to trend upward, follow-up pelvic ultrasound could be considered in 2 weeks for further evaluation.   Electronically Signed   By: Roanna Raider M.D.   On: 04/06/2014 22:25   US Ob Transvaginal  04/06/2014   CLINICAL  DATA:  Vaginal bleeding for 2-3 weeks.  EXAM: OBSTETRIC <14 WK [place] AND TRANSVAGINAL OB US  TECHNIQUE: Both transabdominal and transvaginal ultrasound examinations were performed for complete evaluation of the gestation as well as the maternal uterus, adnexal regions, and pelvic cul-de-sac. Transvaginal technique was performed to assess early pregnancy.  COMPARISON:  None.  FINDINGS: Intrauterine gestational sac:  None seen.  Yolk sac:  N/A  Embryo:  N/A  Maternal uterus/adnexae: The uterus is unremarkable in appearance.  The ovaries are within normal limits. The right ovary measures 4.3 x 2.9 x 2.7 cm, while  the left ovary measures 3.7 x 2.3 x 3.5 cm. No suspicious adnexal masses are seen  Trace free fluid is seen within the pelvic cul-de-sac.  IMPRESSION: No intrauterine gestational sac seen. No evidence for ectopic pregnancy at this time. Given the quantitative beta HCG level of 374, it remains too early to visualize a gestational sac. If the quantitative beta HCG levels continue to trend upward, follow-up pelvic ultrasound could be considered in 2 weeks for further evaluation.   Electronically Signed   By: Roanna Raider M.D.   On: 04/06/2014 22:25     EKG Interpretation None      MDM   Final diagnoses:  Bleeding in early pregnancy  Threatened abortion in early pregnancy       Angus Seller, PA-C 04/06/14 2236

## 2014-04-06 NOTE — ED Notes (Signed)
PResents with vaginal bleeding began two-three weeks ago has not stopped-took a pregnancy test with a positive result.  Denies pain. Using toliet paper for the bleeding and changing every 30 minutes.

## 2014-04-07 LAB — HIV ANTIBODY (ROUTINE TESTING W REFLEX): HIV 1&2 Ab, 4th Generation: NONREACTIVE

## 2014-04-07 LAB — GC/CHLAMYDIA PROBE AMP
CT PROBE, AMP APTIMA: NEGATIVE
GC PROBE AMP APTIMA: NEGATIVE

## 2014-04-07 NOTE — ED Provider Notes (Signed)
Medical screening examination/treatment/procedure(s) were performed by non-physician practitioner and as supervising physician I was immediately available for consultation/collaboration.   EKG Interpretation None       Todrick Siedschlag R. Thomos Domine, MD 04/07/14 1423 

## 2014-04-10 ENCOUNTER — Inpatient Hospital Stay (HOSPITAL_COMMUNITY): Payer: Medicaid Other

## 2014-04-10 ENCOUNTER — Inpatient Hospital Stay (HOSPITAL_COMMUNITY)
Admission: AD | Admit: 2014-04-10 | Discharge: 2014-04-10 | Disposition: A | Discharge status: Home / Self Care | Payer: Medicaid Other | Source: Ambulatory Visit | Attending: Obstetrics & Gynecology | Admitting: Obstetrics & Gynecology

## 2014-04-10 ENCOUNTER — Encounter (HOSPITAL_COMMUNITY): Payer: Self-pay

## 2014-04-10 DIAGNOSIS — O0281 Inappropriate change in quantitative human chorionic gonadotropin (hCG) in early pregnancy: Secondary | ICD-10-CM | POA: Insufficient documentation

## 2014-04-10 DIAGNOSIS — R109 Unspecified abdominal pain: Secondary | ICD-10-CM | POA: Diagnosis not present

## 2014-04-10 DIAGNOSIS — O209 Hemorrhage in early pregnancy, unspecified: Secondary | ICD-10-CM

## 2014-04-10 DIAGNOSIS — O26899 Other specified pregnancy related conditions, unspecified trimester: Secondary | ICD-10-CM

## 2014-04-10 DIAGNOSIS — O4690 Antepartum hemorrhage, unspecified, unspecified trimester: Secondary | ICD-10-CM

## 2014-04-10 HISTORY — DX: Unspecified infectious disease: B99.9

## 2014-04-10 HISTORY — DX: Cardiac murmur, unspecified: R01.1

## 2014-04-10 HISTORY — DX: Anxiety disorder, unspecified: F41.9

## 2014-04-10 LAB — URINALYSIS, ROUTINE W REFLEX MICROSCOPIC
Bilirubin Urine: NEGATIVE
Glucose, UA: NEGATIVE mg/dL
KETONES UR: NEGATIVE mg/dL
LEUKOCYTES UA: NEGATIVE
NITRITE: NEGATIVE
PH: 6 (ref 5.0–8.0)
PROTEIN: NEGATIVE mg/dL
Specific Gravity, Urine: <1.005 — ABNORMAL LOW (ref 1.005–1.030)
Urobilinogen, UA: 0.2 mg/dL (ref 0.0–1.0)

## 2014-04-10 LAB — URINE MICROSCOPIC-ADD ON

## 2014-04-10 LAB — HCG, QUANTITATIVE, PREGNANCY: HCG, BETA CHAIN, QUANT, S: 638 m[IU]/mL — AB (ref <5)

## 2014-04-10 NOTE — MAU Provider Note (Signed)
History     CSN: [id number]  Arrival date and time: 04/10/14 1237   None     Chief Complaint  Patient presents with  . Vaginal Bleeding  . Abdominal Pain   HPI  [name] is a 23 y.o. female G1P0 at [redacted]w[redacted]d who presents to MAU with complaints of heavy vaginal bleeding and increased abdominal pain. She was seen at [facility] 4 days ago and evaluated for vaginal bleeding in pregnancy; US showed no IUP, no yolk sac. The patient was instructed to follow up with her OB dr. In 2 weeks. Her significant other brought her to MAU due to increased abdominal pain that comes and goes.  When the pain in her abdomen occurs it is sharp and she rates it 8/10.   OB History   Grav Para Term Preterm Abortions TAB SAB Ect Mult Living   1               No past medical history on file.  No past surgical history on file.  No family history on file.  History  Substance Use Topics  . Smoking status: Never Smoker   . Smokeless tobacco: Not on file  . Alcohol Use: No    Allergies:  Allergies  Allergen Reactions  . Augmentin [Amoxicillin-Pot Clavulanate]     Hot flashes   . Codeine Nausea Only    Prescriptions prior to admission  Medication Sig Dispense Refill  . aspirin-acetaminophen-caffeine (EXCEDRIN MIGRAINE) 250-250-65 MG per tablet Take 2 tablets by mouth daily as needed for headache.       Results for orders placed during the hospital encounter of 04/10/14 (from the past 48 hour(s))  HCG, QUANTITATIVE, PREGNANCY     Status: Abnormal   Collection Time    04/10/14  1:29 PM      Result Value Ref Range   hCG, Beta Chain, Quant, S 638 (*) <5 mIU/mL   Comment:              GEST. AGE      CONC.  (mIU/mL)       <=1 WEEK        5 - 50         2 WEEKS       50 - 500         3 WEEKS       100 - 10,000         4 WEEKS     1,000 - 30,000         5 WEEKS     3,500 - 115,000       6-8 WEEKS     12,000 - 270,000        12 WEEKS     15,000 - 220,000                FEMALE AND  NON-PREGNANT FEMALE:         LESS THAN 5 mIU/mL   US Ob Transvaginal  04/10/2014   CLINICAL DATA:  Pregnancy with inconclusive fetal viability. Severe abdominal pain.  EXAM: TRANSVAGINAL OB ULTRASOUND  TECHNIQUE: Transvaginal ultrasound was performed for complete evaluation of the gestation as well as the maternal uterus, adnexal regions, and pelvic cul-de-sac.  COMPARISON:  04/06/2014  FINDINGS: No intrauterine gestational sac or other fluid collection visualized in endometrial cavity.  Both ovaries are normal in appearance. No adnexal mass identified. Small amount of simple free fluid is noted which is increased since previous study.  IMPRESSION: Small amount of simple free fluid noted, however no adnexal mass or IUP visualized. Differential diagnosis still includes IUP too early to visualize and occult ectopic pregnancy. Recommend close follow up of quantitative B-HCG levels, and follow up US as clinically warranted.   Electronically Signed   By: Myles RosenthalJohn  Stahl M.D.   On: 04/10/2014 16:56    Review of Systems  Constitutional: Negative for fever and chills.  Gastrointestinal: Positive for abdominal pain.  Genitourinary: Negative for dysuria, urgency, frequency and hematuria.       No vaginal discharge. + vaginal bleeding. No dysuria.    Physical Exam   Blood pressure 113/79, pulse 80, temperature 99.5 F (37.5 C), temperature source Oral, resp. rate 16, height 5' 5.5" (1.664 m), weight 69.219 kg (152 lb 9.6 oz), last menstrual period 03/18/2014, SpO2 100.00%.  Physical Exam  Constitutional: She is oriented to person, place, and time. She appears well-developed and well-nourished. No distress.  HENT:  Head: Normocephalic.  Eyes: Pupils are equal, round, and reactive to light.  Neck: Neck supple.  GI: Soft. There is tenderness (Right lower- mid abdominal tenderness ).  Musculoskeletal: Normal range of motion.  Neurological: She is alert and oriented to person, place, and time.  Skin: Skin  is warm. She is not diaphoretic.  Psychiatric: Her behavior is normal.    MAU Course  Procedures None  MDM Beta hcg level on 8/13: 374 8/17: 638 Discussed patient with Dr.Eure; pt to return to MAU in 1 week for follow up beta bcg and possible US if rise is appropriate.  O positive blood type  Assessment and Plan   A:  1. Vaginal bleeding in pregnancy, first trimester; cannot rule out ectopic pregnancy   2. Abdominal pain in pregnancy   3.  Inappropriate rise in beta hcg levels.   P:  Discharge home in stable condition (pt rates her pain 4/10 upon discharge) Ectopic precautions discussed at length Return to MAU with any increase in pain or bleeding Pelvic rest Return to MAU in one week for repeat beta hcg    Carrie HansenJennifer Irene Bishop Vanderwerf, NP  04/12/2014, 8:43 AM

## 2014-04-10 NOTE — Discharge Instructions (Signed)

## 2014-04-10 NOTE — MAU Note (Signed)
Patient states she has been having vaginal bleeding with cramping since [facility]. Had been bleeding a week before she was seen there.

## 2014-04-12 ENCOUNTER — Encounter (HOSPITAL_COMMUNITY): Payer: Self-pay

## 2014-04-12 ENCOUNTER — Inpatient Hospital Stay (HOSPITAL_COMMUNITY)
Admission: AD | Admit: 2014-04-12 | Discharge: 2014-04-12 | Disposition: A | Discharge status: Home / Self Care | Payer: Medicaid Other | Source: Ambulatory Visit | Attending: Obstetrics and Gynecology | Admitting: Obstetrics and Gynecology

## 2014-04-12 DIAGNOSIS — O200 Threatened abortion: Secondary | ICD-10-CM | POA: Diagnosis not present

## 2014-04-12 DIAGNOSIS — R109 Unspecified abdominal pain: Secondary | ICD-10-CM | POA: Insufficient documentation

## 2014-04-12 DIAGNOSIS — O209 Hemorrhage in early pregnancy, unspecified: Secondary | ICD-10-CM | POA: Diagnosis present

## 2014-04-12 DIAGNOSIS — O034 Incomplete spontaneous abortion without complication: Secondary | ICD-10-CM

## 2014-04-12 LAB — HCG, QUANTITATIVE, PREGNANCY: hCG, Beta Chain, Quant, S: 536 m[IU]/mL — ABNORMAL HIGH (ref <5)

## 2014-04-12 MED ORDER — PROMETHAZINE HCL 25 MG PO TABS: 25.0000 mg | ORAL_TABLET | Freq: Four times a day (QID) | ORAL | Status: DC | PRN

## 2014-04-12 MED ORDER — MISOPROSTOL 200 MCG PO TABS: ORAL_TABLET | ORAL | Status: DC

## 2014-04-12 MED ORDER — OXYCODONE-ACETAMINOPHEN 5-325 MG PO TABS
1.0000 | ORAL_TABLET | Freq: Four times a day (QID) | ORAL | Status: DC | PRN
Start: 2014-04-12 — End: 2014-05-16

## 2014-04-12 NOTE — Discharge Instructions (Signed)
Incomplete Miscarriage A miscarriage is the sudden loss of an unborn baby (fetus) before the 20th week of pregnancy. In an incomplete miscarriage, parts of the fetus or placenta (afterbirth) remain in the body.  Having a miscarriage can be an emotional experience. Talk with your health care provider about any questions you may have about miscarrying, the grieving process, and your future pregnancy plans. CAUSES   Problems with the fetal chromosomes that make it impossible for the baby to develop normally. Problems with the baby's genes or chromosomes are most often the result of errors that occur by chance as the embryo divides and grows. The problems are not inherited from the parents.  Infection of the cervix or uterus.  Hormone problems.  Problems with the cervix, such as having an incompetent cervix. This is when the tissue in the cervix is not strong enough to hold the pregnancy.  Problems with the uterus, such as an abnormally shaped uterus, uterine fibroids, or congenital abnormalities.  Certain medical conditions.  Smoking, drinking alcohol, or taking illegal drugs.  Trauma. SYMPTOMS   Vaginal bleeding or spotting, with or without cramps or pain.  Pain or cramping in the abdomen or lower back.  Passing fluid, tissue, or blood clots from the vagina. DIAGNOSIS  Your health care provider will perform a physical exam. You may also have an ultrasound to confirm the miscarriage. Blood or urine tests may also be ordered. TREATMENT   Usually, a dilation and curettage (D&C) procedure is performed. During a D&C procedure, the cervix is widened (dilated) and any remaining fetal or placental tissue is gently removed from the uterus.  Antibiotic medicines are prescribed if there is an infection. Other medicines may be given to reduce the size of the uterus (contract) if there is a lot of bleeding.  If you have Rh negative blood and your baby was Rh positive, you will need a Rho (D)  immune globulin shot. This shot will protect any future baby from having Rh blood problems in future pregnancies.  You may be confined to bed rest. This means you should stay in bed and only get up to use the bathroom. HOME CARE INSTRUCTIONS   Rest as directed by your health care provider.  Restrict activity as directed by your health care provider. You may be allowed to continue light activity if curettage was not done but you require further treatment.  Keep track of the number of pads you use each day. Keep track of how soaked (saturated) they are. Record this information.  Do not  use tampons.  Do not douche or have sexual intercourse until approved by your health care provider.  Keep all follow-up appointments for reevaluation and continuing management.  Only take over-the-counter or prescription medicines for pain, fever, or discomfort as directed by your health care provider.  Take antibiotic medicine as directed by your health care provider. Make sure you finish it even if you start to feel better. SEEK IMMEDIATE MEDICAL CARE IF:   You experience severe cramps in your stomach, back, or abdomen.  You have an unexplained temperature (make sure to record these temperatures).  You pass large clots or tissue (save these for your health care provider to inspect).  Your bleeding increases.  You become light-headed, weak, or have fainting episodes. MAKE SURE YOU:   Understand these instructions.  Will watch your condition.  Will get help right away if you are not doing well or get worse. Document Released: 08/11/2005 Document Revised: 12/26/2013 Document Reviewed:   03/10/2013 [employer] Patient Information 2015 [employer]. This information is not intended to replace advice given to you by your health care provider. Make sure you discuss any questions you have with your health care provider.  

## 2014-04-12 NOTE — MAU Note (Signed)
Pt presents to MAU with complaints of having vaginal bleeding that is bright red, she was evaluated in MAU on the 17th and was told to follow up in a week for repeat lab work. Reports lower abdominal cramping

## 2014-04-12 NOTE — MAU Provider Note (Signed)
History     CSN: [id number]  Arrival date and time: 04/12/14 1748   None     Chief Complaint  Patient presents with  . Vaginal Bleeding   HPI This is a 23 y.o. female at [redacted]w[redacted]d who presents with c/o heavy vaginal bleeding today. Has been evaluated 2 days ago for the same. Also has had some cramping. Not sure if she wants to be pregnant. Thinks this is a miscarriage.    RN Note: Pt presents to MAU with complaints of having vaginal bleeding that is bright red, she was evaluated in MAU on the 17th and was told to follow up in a week for repeat lab work. Reports lower abdominal cramping       OB History   Grav Para Term Preterm Abortions TAB SAB Ect Mult Living   2 0 0 0 1 1 0         Past Medical History  Diagnosis Date  . Heart murmur     at birth  . Infection     UTI  . Anxiety     hx of meds    Past Surgical History  Procedure Laterality Date  . No past surgeries      Family History  Problem Relation Age of Onset  . Diabetes Mother   . Hypertension Father   . Hyperlipidemia Father   . Diabetes Maternal Grandfather   . Cancer Paternal Grandmother   . Cancer Paternal Grandfather   . Stroke Paternal Grandfather   . Heart disease Paternal Grandfather   . Hearing loss Neg Hx     History  Substance Use Topics  . Smoking status: Never Smoker   . Smokeless tobacco: Former Neurosurgeon  . Alcohol Use: No    Allergies:  Allergies  Allergen Reactions  . Augmentin [Amoxicillin-Pot Clavulanate]     Hot flashes   . Codeine Nausea Only    No prescriptions prior to admission    Review of Systems  Constitutional: Negative for fever and chills.  Gastrointestinal: Positive for abdominal pain. Negative for nausea, vomiting, diarrhea and constipation.  Genitourinary:       Vaginal bleeding   Neurological: Negative for headaches.   Physical Exam   Blood pressure 111/63, pulse 74, temperature 99.3 F (37.4 C), temperature source Oral, resp. rate 16, last  menstrual period 03/18/2014.  Physical Exam  Constitutional: She is oriented to person, place, and time. She appears well-developed and well-nourished. No distress.  HENT:  Head: Normocephalic.  Cardiovascular: Normal rate.   Respiratory: Effort normal.  GI: Soft. She exhibits no distension and no mass. There is tenderness (slightly tender over uterus). There is no rebound and no guarding.  Genitourinary: Vagina normal and uterus normal. Vaginal discharge: moderate blood.  Musculoskeletal: Normal range of motion.  Neurological: She is alert and oriented to person, place, and time.  Skin: Skin is warm and dry.  Psychiatric: She has a normal mood and affect.    MAU Course  Procedures  MDM  Results for orders placed during the hospital encounter of 04/12/14 (from the past 24 hour(s))  HCG, QUANTITATIVE, PREGNANCY     Status: Abnormal   Collection Time    04/12/14  8:05 PM      Result Value Ref Range   hCG, Beta Chain, Quant, S 536 (*) <5 mIU/mL   Results for AMOREENA, NEUBERT (MRN [id number]) as of 04/12/2014 20:40  Ref. Range 04/10/2014 13:29  hCG, Beta Chain, Quant, S Latest Range: <5 mIU/mL 638 (  H)    Assessment and Plan  A:  Pregnancy at 2658w6d       Bleeding and cramping      Threatened Abortion, probably incomplete SAB  P:  Discussed results and options       Plans expectant management for now but wants Rx for Cytotec       RTC one week for quant in clinic  [facility] LLCWILLIAMS,Oanh Devivo 04/12/2014, 8:29 PM

## 2014-04-14 NOTE — MAU Provider Note (Signed)
Attestation of Attending Supervision of Advanced Practitioner: Evaluation and management procedures were performed by the PA/NP/CNM/OB Fellow under my supervision/collaboration. Chart reviewed and agree with management and plan.  Kristyana Notte V 04/14/2014 8:11 AM   

## 2014-04-19 ENCOUNTER — Other Ambulatory Visit: Payer: Medicaid Other

## 2014-04-19 ENCOUNTER — Telehealth: Payer: Self-pay | Admitting: General Practice

## 2014-04-19 DIAGNOSIS — O200 Threatened abortion: Secondary | ICD-10-CM

## 2014-04-19 LAB — HCG, QUANTITATIVE, PREGNANCY: hCG, Beta Chain, Quant, S: 1080 m[IU]/mL

## 2014-04-19 NOTE — Telephone Encounter (Signed)
Called patient asking about her lab appt this morning. Patient states that she is planning to come this afternoon and that she has someone at her house right now fixing her bathroom. Patient asked if her ultrasound was otherwise normal because she wants to make sure nothing else is wrong like having an ovarian cyst or something. Told patient that her ultrasound came back otherwise normal. Patient verbalized understanding and asked if she should still be bleeding. Told patient women can bleed for a while after a miscarriage but that the important thing was following up on her pregnancy hormone levels. Patient verbalized understanding and had no other questions

## 2014-04-20 ENCOUNTER — Inpatient Hospital Stay (HOSPITAL_COMMUNITY): Payer: Medicaid Other

## 2014-04-20 ENCOUNTER — Inpatient Hospital Stay (HOSPITAL_COMMUNITY)
Admission: AD | Admit: 2014-04-20 | Discharge: 2014-04-20 | Disposition: A | Discharge status: Home / Self Care | Payer: Medicaid Other | Source: Ambulatory Visit | Attending: Obstetrics & Gynecology | Admitting: Obstetrics & Gynecology

## 2014-04-20 ENCOUNTER — Encounter (HOSPITAL_COMMUNITY): Payer: Self-pay | Admitting: *Deleted

## 2014-04-20 ENCOUNTER — Telehealth: Payer: Self-pay | Admitting: *Deleted

## 2014-04-20 DIAGNOSIS — O00109 Unspecified tubal pregnancy without intrauterine pregnancy: Secondary | ICD-10-CM | POA: Diagnosis not present

## 2014-04-20 DIAGNOSIS — R109 Unspecified abdominal pain: Secondary | ICD-10-CM | POA: Insufficient documentation

## 2014-04-20 LAB — CBC WITH DIFFERENTIAL/PLATELET
Basophils Absolute: 0 10*3/uL (ref 0.0–0.1)
Basophils Relative: 0 % (ref 0–1)
EOS ABS: 0 10*3/uL (ref 0.0–0.7)
Eosinophils Relative: 1 % (ref 0–5)
HCT: 40.2 % (ref 36.0–46.0)
Hemoglobin: 13.8 g/dL (ref 12.0–15.0)
LYMPHS ABS: 1.6 10*3/uL (ref 0.7–4.0)
Lymphocytes Relative: 24 % (ref 12–46)
MCH: 31.1 pg (ref 26.0–34.0)
MCHC: 34.3 g/dL (ref 30.0–36.0)
MCV: 90.5 fL (ref 78.0–100.0)
MONOS PCT: 7 % (ref 3–12)
Monocytes Absolute: 0.5 10*3/uL (ref 0.1–1.0)
NEUTROS PCT: 68 % (ref 43–77)
Neutro Abs: 4.5 10*3/uL (ref 1.7–7.7)
Platelets: 228 10*3/uL (ref 150–400)
RBC: 4.44 MIL/uL (ref 3.87–5.11)
RDW: 13.1 % (ref 11.5–15.5)
WBC: 6.6 10*3/uL (ref 4.0–10.5)

## 2014-04-20 LAB — TYPE AND SCREEN
ABO/RH(D): O POS
Antibody Screen: NEGATIVE

## 2014-04-20 LAB — CREATININE, SERUM
CREATININE: 0.81 mg/dL (ref 0.50–1.10)
GFR calc Af Amer: >90 mL/min (ref >90)

## 2014-04-20 LAB — ABO/RH: ABO/RH(D): O POS

## 2014-04-20 LAB — BUN: BUN: 11 mg/dL (ref 6–23)

## 2014-04-20 LAB — AST: AST: 24 U/L (ref 0–37)

## 2014-04-20 MED ORDER — METHOTREXATE INJECTION FOR WOMEN'S HOSPITAL
50.0000 mg/m2 | Freq: Once | INTRAMUSCULAR | Status: AC
  Administered 2014-04-20: 90 mg via INTRAMUSCULAR
  Filled 2014-04-20: qty 1.8

## 2014-04-20 NOTE — Telephone Encounter (Signed)
Dr Debroah Loop reviewed patient's labs, instructed for patient to go to MAU for further evaluation to rule out ectopic pregnancy. Called patient and informed her of lab results and need to go to MAU for further evaluation. Patient verbalized understanding and states she is on her way there now. Patient had no other questions

## 2014-04-20 NOTE — MAU Provider Note (Signed)
Attestation of Attending Supervision of Advanced Practitioner (PA/CNM/NP): Evaluation and management procedures were performed by the Advanced Practitioner under my supervision and collaboration.  I have reviewed the Advanced Practitioner's note and chart, and I agree with the management and plan.  Neev Mcmains, MD, FACOG Attending Obstetrician & Gynecologist Faculty Practice, [facility]   

## 2014-04-20 NOTE — MAU Note (Signed)
Patient states she was seen in the clinic today and her BHCG went up. Had gone down on the last draw one week ago. States she was told to come to MAU for an ultrasound. States she has bleeding and pain on and off.

## 2014-04-20 NOTE — MAU Provider Note (Signed)
History   None      Chief Complaint:  Follow-up   Carrie [name] is  23 y.o. G2P0010 Patient's last menstrual period was 03/18/2014.Marland Kitchen Patient is here for follow up of quantitative HCG and ongoing surveillance of pregnancy status.   She is [redacted]w[redacted]d weeks gestation  by LMP.    Since her last visit, the patient is with new complaint.   The patient reports bleeding as  bright red, lighter than period and and generalized lower abdominal pain  General ROS:  negative for nausea/vomiting, HA, vision changes, dizziness  Her previous Quantitative HCG values are:  Lab Results  Component Value Date   HCGBETAQNT 1080 04/19/2014   HCGBETAQNT 536* 04/12/2014   HCGBETAQNT 638* 04/10/2014    Physical Exam   Blood pressure 106/73, pulse 81, temperature 99.2 F (37.3 C), temperature source Oral, resp. rate 18, height 5' 5.5" (1.664 m), weight 70.398 kg (155 lb 3.2 oz), last menstrual period 03/18/2014, SpO2 100.00%.  Focused Gynecological Exam: examination not indicated  Labs: Results for orders placed during the hospital encounter of 04/20/14 (from the past 24 hour(s))  CBC WITH DIFFERENTIAL   Collection Time    04/20/14  6:10 PM      Result Value Ref Range   WBC 6.6  4.0 - 10.5 K/uL   RBC 4.44  3.87 - 5.11 MIL/uL   Hemoglobin 13.8  12.0 - 15.0 g/dL   HCT 16.1  09.6 - 04.5 %   MCV 90.5  78.0 - 100.0 fL   MCH 31.1  26.0 - 34.0 pg   MCHC 34.3  30.0 - 36.0 g/dL   RDW 40.9  81.1 - 91.4 %   Platelets 228  150 - 400 K/uL   Neutrophils Relative % 68  43 - 77 %   Neutro Abs 4.5  1.7 - 7.7 K/uL   Lymphocytes Relative 24  12 - 46 %   Lymphs Abs 1.6  0.7 - 4.0 K/uL   Monocytes Relative 7  3 - 12 %   Monocytes Absolute 0.5  0.1 - 1.0 K/uL   Eosinophils Relative 1  0 - 5 %   Eosinophils Absolute 0.0  0.0 - 0.7 K/uL   Basophils Relative 0  0 - 1 %   Basophils Absolute 0.0  0.0 - 0.1 K/uL  AST   Collection Time    04/20/14  6:10 PM      Result Value Ref Range   AST 24  0 - 37 U/L  BUN   Collection Time    04/20/14  6:10 PM      Result Value Ref Range   BUN 11  6 - 23 mg/dL  CREATININE, SERUM   Collection Time    04/20/14  6:10 PM      Result Value Ref Range   Creatinine, Ser 0.81  0.50 - 1.10 mg/dL   GFR calc non Af Amer >90  >90 mL/min   GFR calc Af Amer >90  >90 mL/min  TYPE AND SCREEN   Collection Time    04/20/14  6:10 PM      Result Value Ref Range   ABO/RH(D) O POS     Antibody Screen PENDING     Sample Expiration 04/23/2014      Ultrasound Studies:   US Ob Comp Less 14 Wks  04/20/2014   CLINICAL DATA:  Concern for possible ectopic pregnancy.  EXAM: OBSTETRIC <14 WK [place] AND TRANSVAGINAL OB US  TECHNIQUE: Both transabdominal and transvaginal ultrasound  examinations were performed for complete evaluation of the gestation as well as the maternal uterus, adnexal regions, and pelvic cul-de-sac. Transvaginal technique was performed to assess early pregnancy.  COMPARISON:  Pelvic ultrasound 04/10/2014; 04/06/2014.  FINDINGS: Intrauterine gestational sac: Not visualized  Yolk sac:  Not present  Embryo:  Not present  Maternal uterus/adnexae: Within the right ovary there is a 4.3 x 4.7 x 4.5 cm cyst with suggestion of internal retracting clot, suggestive of a large hemorrhagic cyst. Complicated fluid within the right adnexa.  Adjacent to the left ovary is a 2.9 x 1.8 x 2.5 cm rounded mass. No definite flow is demonstrated within this mass with color Doppler. There is adjacent free fluid.  IMPRESSION: No intrauterine pregnancy identified.  There is a rounded mass within the left adnexa adjacent to the left ovary suggestive of an ectopic pregnancy. Moderate amount of free fluid in the pelvis.  Probable large hemorrhagic cyst within the right ovary with adjacent complicated fluid.  Critical Value/emergent results were called by telephone at the time of interpretation on 04/20/2014 at 5:52 pm to Dr. Wynelle Bourgeois , who verbally acknowledged these results.   Electronically Signed   By:  Annia Belt M.D.   On: 04/20/2014 17:55    Assessment: Left ectopic pregnancy  [redacted]w[redacted]d weeks gestation   Plan: Methotrexate today.  F/U 4 days for HCG levels  CRESENZO-DISHMAN,Rajean Desantiago 04/20/2014, 7:49 PM

## 2014-04-20 NOTE — Discharge Instructions (Signed)
Methotrexate Treatment for an Ectopic Pregnancy Methotrexate is a medicine that treats ectopic pregnancy by stopping the growth of the fertilized egg. It also helps your body absorb tissue from the egg. This takes between 2 weeks and 6 weeks. Most ectopic pregnancies can be successfully treated with methotrexate if they are detected early enough. LET [facility] CARE PROVIDER KNOW ABOUT:  Any allergies you have.  All medicines you are taking, including vitamins, herbs, eye drops, creams, and over-the-counter medicines.  Medical conditions you have. RISKS AND COMPLICATIONS Generally, this is a safe treatment. However, as with any treatment, problems can occur. Possible problems or side effects include:  Nausea.  Vomiting. Diarrhea.Ectopic Pregnancy An ectopic pregnancy is when the fertilized egg attaches (implants) outside the uterus. Most ectopic pregnancies occur in the fallopian tube. Rarely do ectopic pregnancies occur on the ovary, intestine, pelvis, or cervix. In an ectopic pregnancy, the fertilized egg does not have the ability to develop into a normal, healthy baby.  A ruptured ectopic pregnancy is one in which the fallopian tube gets torn or bursts and results in internal bleeding. Often there is intense abdominal pain, and sometimes, vaginal bleeding. Having an ectopic pregnancy can be life threatening. If left untreated, this dangerous condition can lead to a blood transfusion, abdominal surgery, or even death. CAUSES  Damage to the fallopian tubes is the suspected cause in most ectopic pregnancies.  RISK FACTORS Depending on your circumstances, the risk of having an ectopic pregnancy will vary. The level of risk can be divided into three categories. High Risk  You have gone through infertility treatment.  You have had a previous ectopic pregnancy.  You have had previous tubal surgery.  You have had previous surgery to have the fallopian tubes tied (tubal ligation).  You  have tubal problems or diseases.  You have been exposed to DES. DES is a medicine that was used until 1971 and had effects on babies whose mothers took the medicine.  You become pregnant while using an intrauterine device (IUD) for birth control. Moderate Risk  You have a history of infertility.  You have a history of a sexually transmitted infection (STI).  You have a history of pelvic inflammatory disease (PID).  You have scarring from endometriosis.  You have multiple sexual partners.  You smoke. Low Risk  You have had previous pelvic surgery.  You use vaginal douching.  You became sexually active before 24 years of age. SIGNS AND SYMPTOMS  An ectopic pregnancy should be suspected in anyone who has missed a period and has abdominal pain or bleeding.  You may experience normal pregnancy symptoms, such as:  Nausea.  Tiredness.  Breast tenderness.  Other symptoms may include:  Pain with intercourse.  Irregular vaginal bleeding or spotting.  Cramping or pain on one side or in the lower abdomen.  Fast heartbeat.  Passing out while having a bowel movement.  Symptoms of a ruptured ectopic pregnancy and internal bleeding may include:  Sudden, severe pain in the abdomen and pelvis.  Dizziness or fainting.  Pain in the shoulder area. DIAGNOSIS  Tests that may be performed include:  A pregnancy test.  An ultrasound test.  Testing the specific level of pregnancy hormone in the bloodstream.  Taking a sample of uterus tissue (dilation and curettage, D&C).  Surgery to perform a visual exam of the inside of the abdomen using a thin, lighted tube with a tiny camera on the end (laparoscope). TREATMENT  An injection of a medicine called methotrexate  may be given. This medicine causes the pregnancy tissue to be absorbed. It is given if:  The diagnosis is made early.  The fallopian tube has not ruptured.  You are considered to be a good candidate for the  medicine. Usually, pregnancy hormone blood levels are checked after methotrexate treatment. This is to be sure the medicine is effective. It may take 4-6 weeks for the pregnancy to be absorbed (though most pregnancies will be absorbed by 3 weeks). Surgical treatment may be needed. A laparoscope may be used to remove the pregnancy tissue. If severe internal bleeding occurs, a cut (incision) may be made in the lower abdomen (laparotomy), and the ectopic pregnancy is removed. This stops the bleeding. Part of the fallopian tube, or the whole tube, may be removed as well (salpingectomy). After surgery, pregnancy hormone tests may be done to be sure there is no pregnancy tissue left. You may receive a Rho (D) immune globulin shot if you are Rh negative and the father is Rh positive, or if you do not know the Rh type of the father. This is to prevent problems with any future pregnancy. SEEK IMMEDIATE MEDICAL CARE IF:  You have any symptoms of an ectopic pregnancy. This is a medical emergency. MAKE SURE YOU:  Understand these instructions.  Will watch your condition.  Will get help right away if you are not doing well or get worse. Document Released: 09/18/2004 Document Revised: 12/26/2013 Document Reviewed: 03/10/2013 [facility] Patient Information 2015 [place], [place]. This information is not intended to replace advice given to you by your health care provider. Make sure you discuss any questions you have with your health care provider.    Abdominal cramping.  Mouth sores.  Increased vaginal bleeding or spotting.   Swelling or irritation of the lining of your lungs (pneumonitis).  Failed treatment and continuation of the pregnancy.   Liver damage.  Hair loss. There is still a risk of the ectopic pregnancy rupturing while using the methotrexate. BEFORE THE PROCEDURE Before you take the medicine:   Liver tests, kidney tests, and a complete blood test are performed.  Blood tests are  performed to measure the pregnancy hormone levels and to determine your blood type.  If you are Rh-negative and the father is Rh-positive or his Rh type is not known, you will be given a Rho (D) immune globulin shot. PROCEDURE  There are two methods that your health care provider may use to prescribe methotrexate. One method involves a single dose or injection of the medicine. Another method involves a series of doses given through several injections.  AFTER THE PROCEDURE  You may have some abdominal cramping, vaginal bleeding, and fatigue in the first few days after taking methotrexate.  Blood tests will be taken for several weeks to check the pregnancy hormone levels. The blood tests are performed until there is no more pregnancy hormone detected in the blood. Document Released: 08/05/2001 Document Revised: 12/26/2013 Document Reviewed: 05/30/2013 [facility] Patient Information 2015 [place], [place]. This information is not intended to replace advice given to you by your health care provider. Make sure you discuss any questions you have with your health care provider.

## 2014-04-20 NOTE — Progress Notes (Signed)
Pt instructed to f/u on Sunday April 23, 2014 in MAU, pt verbalizes understanding.

## 2014-04-20 NOTE — Telephone Encounter (Signed)
Pt left message requesting blood test results from yesterday and also has some questions.

## 2014-04-23 ENCOUNTER — Inpatient Hospital Stay (HOSPITAL_COMMUNITY)
Admission: AD | Admit: 2014-04-23 | Discharge: 2014-04-23 | Disposition: A | Discharge status: Home / Self Care | Payer: Medicaid Other | Source: Ambulatory Visit | Attending: Obstetrics & Gynecology | Admitting: Obstetrics & Gynecology

## 2014-04-23 DIAGNOSIS — O00109 Unspecified tubal pregnancy without intrauterine pregnancy: Secondary | ICD-10-CM | POA: Diagnosis present

## 2014-04-23 DIAGNOSIS — O009 Unspecified ectopic pregnancy without intrauterine pregnancy: Secondary | ICD-10-CM

## 2014-04-23 LAB — HCG, QUANTITATIVE, PREGNANCY: hCG, Beta Chain, Quant, S: 621 m[IU]/mL — ABNORMAL HIGH (ref <5)

## 2014-04-23 NOTE — Discharge Instructions (Signed)
Methotrexate Treatment for an Ectopic Pregnancy, Care After °Refer to this sheet in the next few weeks. These instructions provide you with information on caring for yourself after your procedure. Your health care provider may also give you more specific instructions. Your treatment has been planned according to current medical practices, but problems sometimes occur. Call your health care provider if you have any problems or questions after your procedure. °WHAT TO EXPECT AFTER THE PROCEDURE °You may have some abdominal cramping, vaginal bleeding, and fatigue in the first few days after taking methotrexate. Some other possible side effects of methotrexate include: °· Nausea. °· Vomiting. °· Diarrhea. °· Mouth sores. °· Swelling or irritation of the lining of your lungs (pneumonitis). °· Liver damage. °· Hair loss. °HOME CARE INSTRUCTIONS  °After you have received the methotrexate medicine, you need to be careful of your activities and watch your condition for several weeks. It may take 1 week before your hormone levels return to normal. °· Keep all follow-up appointments as directed by your health care provider. °· Avoid traveling too far away from your health care provider. °· Do not have sexual intercourse until your health care provider says it is safe to do so. °· You may resume your usual diet. °· Limit strenuous activity. °· Do not take folic acid, prenatal vitamins, or other vitamins that contain folic acid. °· Do not take aspirin, ibuprofen, or naproxen (nonsteroidal anti-inflammatory drugs [NSAIDs]). °· Do not drink alcohol. °SEEK MEDICAL CARE IF:  °· You cannot control your nausea and vomiting. °· You cannot control your diarrhea. °· You have sores in your mouth and want treatment. °· You need pain medicine for your abdominal pain. °· You have a rash. °· You are having a reaction to the medicine. °SEEK IMMEDIATE MEDICAL CARE IF:  °· You have increasing abdominal or pelvic pain. °· You notice increased  bleeding. °· You feel light-headed, or you faint. °· You have shortness of breath. °· Your heart rate increases. °· You have a cough. °· You have chills. °· You have a fever. °Document Released: 07/31/2011 Document Revised: 08/16/2013 Document Reviewed: 05/30/2013 °ExitCare® Patient Information ©2015 [employer]. This information is not intended to replace advice given to you by your health care provider. Make sure you discuss any questions you have with your health care provider. ° °

## 2014-04-23 NOTE — MAU Note (Signed)
Pt presents to MAU for repeat BHCG for ectopic pregnancy. Pt states pain in her lower abdomen but much better since methatrexate.

## 2014-04-23 NOTE — MAU Provider Note (Signed)
  History     CSN: [id number]  Arrival date and time: 04/23/14 1104   None     Chief Complaint  Patient presents with  . Labs Only   HPI Comments: Carrie [name] 23 y.o. G2P0010 [redacted]w[redacted]d presents to MAU on day 4 of MTX. She is doing very well and having much less pain. She has been in bed for the last 2 days and feeling better today.      Past Medical History  Diagnosis Date  . Heart murmur     at birth  . Infection     UTI  . Anxiety     hx of meds    Past Surgical History  Procedure Laterality Date  . No past surgeries      Family History  Problem Relation Age of Onset  . Diabetes Mother   . Hypertension Father   . Hyperlipidemia Father   . Diabetes Maternal Grandfather   . Cancer Paternal Grandmother   . Cancer Paternal Grandfather   . Stroke Paternal Grandfather   . Heart disease Paternal Grandfather   . Hearing loss Neg Hx     History  Substance Use Topics  . Smoking status: Never Smoker   . Smokeless tobacco: Former Neurosurgeon  . Alcohol Use: No    Allergies:  Allergies  Allergen Reactions  . Augmentin [Amoxicillin-Pot Clavulanate] Nausea And Vomiting    Hot flashes   . Codeine Nausea Only    No prescriptions prior to admission    Review of Systems  Constitutional: Negative.   HENT: Negative.   Eyes: Negative.   Respiratory: Negative.   Cardiovascular: Negative.   Gastrointestinal: Positive for abdominal pain.       Very mild  Genitourinary: Negative.   Musculoskeletal: Negative.   Skin: Negative.   Neurological: Negative.   Endo/Heme/Allergies: Negative.   Psychiatric/Behavioral: Negative.    Physical Exam   Blood pressure 120/88, pulse 112, temperature 98.4 F (36.9 C), temperature source Oral, resp. rate 16, last menstrual period 03/18/2014.  Physical Exam  Constitutional: She is oriented to person, place, and time. She appears well-developed and well-nourished. No distress.  HENT:  Head: Normocephalic and atraumatic.  Eyes:  Conjunctivae are normal. Pupils are equal, round, and reactive to light.  Musculoskeletal: Normal range of motion.  Neurological: She is alert and oriented to person, place, and time.  Skin: Skin is warm and dry.  Psychiatric: She has a normal mood and affect. Her behavior is normal. Judgment and thought content normal.  Very anxious   Results for orders placed during the hospital encounter of 04/23/14 (from the past 24 hour(s))  HCG, QUANTITATIVE, PREGNANCY     Status: Abnormal   Collection Time    04/23/14 11:10 AM      Result Value Ref Range   hCG, Beta Chain, Quant, S 621 (*) <5 mIU/mL   Lab Results  Component Value Date   HCGBETAQNT 621* 04/23/2014   HCGBETAQNT 1080 04/19/2014   HCGBETAQNT 536* 04/12/2014      MAU Course  Procedures  MDM Reviewed with Dr Debroah Loop  Assessment and Plan   A: Ectopic Pregnancy with Methotrexate  P: Follow up for day 7 will be September 2nd Return for any pain/ bleeding as needed Advised to revisit [facility] for contraception management Advised no avoid pregnancy for 3-6 cycles    [name] 04/23/2014, 2:26 PM

## 2014-04-26 ENCOUNTER — Inpatient Hospital Stay (HOSPITAL_COMMUNITY)
Admission: AD | Admit: 2014-04-26 | Discharge: 2014-04-26 | Disposition: A | Discharge status: Home / Self Care | Payer: Medicaid Other | Source: Ambulatory Visit | Attending: Family Medicine | Admitting: Family Medicine

## 2014-04-26 DIAGNOSIS — O00109 Unspecified tubal pregnancy without intrauterine pregnancy: Secondary | ICD-10-CM | POA: Insufficient documentation

## 2014-04-26 DIAGNOSIS — O009 Unspecified ectopic pregnancy without intrauterine pregnancy: Secondary | ICD-10-CM

## 2014-04-26 LAB — AST: AST: 21 U/L (ref 0–37)

## 2014-04-26 LAB — CBC WITH DIFFERENTIAL/PLATELET
Basophils Absolute: 0 10*3/uL (ref 0.0–0.1)
Basophils Relative: 0 % (ref 0–1)
EOS ABS: 0.1 10*3/uL (ref 0.0–0.7)
EOS PCT: 1 % (ref 0–5)
HCT: 37.4 % (ref 36.0–46.0)
HEMOGLOBIN: 12.5 g/dL (ref 12.0–15.0)
LYMPHS ABS: 1.4 10*3/uL (ref 0.7–4.0)
Lymphocytes Relative: 25 % (ref 12–46)
MCH: 30.6 pg (ref 26.0–34.0)
MCHC: 33.4 g/dL (ref 30.0–36.0)
MCV: 91.7 fL (ref 78.0–100.0)
MONOS PCT: 7 % (ref 3–12)
Monocytes Absolute: 0.4 10*3/uL (ref 0.1–1.0)
Neutro Abs: 3.7 10*3/uL (ref 1.7–7.7)
Neutrophils Relative %: 67 % (ref 43–77)
Platelets: 246 10*3/uL (ref 150–400)
RBC: 4.08 MIL/uL (ref 3.87–5.11)
RDW: 13.3 % (ref 11.5–15.5)
WBC: 5.5 10*3/uL (ref 4.0–10.5)

## 2014-04-26 LAB — BUN: BUN: 15 mg/dL (ref 6–23)

## 2014-04-26 LAB — HCG, QUANTITATIVE, PREGNANCY: hCG, Beta Chain, Quant, S: 611 m[IU]/mL — ABNORMAL HIGH (ref <5)

## 2014-04-26 LAB — CREATININE, SERUM
Creatinine, Ser: 0.81 mg/dL (ref 0.50–1.10)
GFR calc non Af Amer: >90 mL/min (ref >90)

## 2014-04-26 MED ORDER — METHOTREXATE INJECTION FOR WOMEN'S HOSPITAL
50.0000 mg/m2 | Freq: Once | INTRAMUSCULAR | Status: AC
  Administered 2014-04-26: 90 mg via INTRAMUSCULAR
  Filled 2014-04-26: qty 1.8

## 2014-04-26 NOTE — MAU Provider Note (Signed)
Attestation of Attending Supervision of Advanced Practitioner (PA/CNM/NP): Evaluation and management procedures were performed by the Advanced Practitioner under my supervision and collaboration.  I have reviewed the Advanced Practitioner's note and chart, and I agree with the management and plan.  Jacob Stinson, DO Attending Physician Faculty Practice, [facility]  

## 2014-04-26 NOTE — MAU Note (Signed)
Pt reports she has been having some pelvic pressure that has been getting less and less. And some spotting not any worse that the other day more light pink in nature

## 2014-04-26 NOTE — Discharge Instructions (Signed)
Ectopic Pregnancy °An ectopic pregnancy happens when a fertilized egg grows outside the uterus. A pregnancy cannot live outside of the uterus. This problem often happens in the fallopian tube. It is often caused by damage to the fallopian tube. °If this problem is found early, you may be treated with medicine. If your tube tears or bursts open (ruptures), you will bleed inside. This is an emergency. You will need surgery. Get help right away.  °SYMPTOMS °You may have normal pregnancy symptoms at first. These include: °· Missing your period. °· Feeling sick to your stomach (nauseous). °· Being tired. °· Having tender breasts. °Then, you may start to have symptoms that are not normal. These include: °· Pain with sex (intercourse). °· Bleeding from the vagina. This includes light bleeding (spotting). °· Belly (abdomen) or lower belly cramping or pain. This may be felt on one side. °· A fast heartbeat (pulse). °· Passing out (fainting) after going poop (bowel movement). °If your tube tears, you may have symptoms such as: °· Really bad pain in the belly or lower belly. This happens suddenly. °· Dizziness. °· Passing out. °· Shoulder pain. °GET HELP RIGHT AWAY IF:  °You have any of these symptoms. This is an emergency. °MAKE SURE YOU: °· Understand these instructions. °· Will watch your condition. °· Will get help right away if you are not doing well or get worse. °Document Released: 11/07/2008 Document Revised: 08/16/2013 Document Reviewed: 03/23/2013 °Exit[employer]® Patient Information ©2015 [employer]. This information is not intended to replace advice given to you by your health care provider. Make sure you discuss any questions you have with your health care provider. ° °

## 2014-04-26 NOTE — MAU Provider Note (Signed)
Subjective:  [name] is a 23 y.o. female G2P0010 at [redacted]w[redacted]d who presents for day #7 beta hcg. Pt received methatrexate for left ectopic pregnancy. Currently the patient has scant pink discharge and mild vaginal pressure. Denies abdominal pain.   RN note:  Pt reports she has been having some pelvic pressure that has been getting less and less. And some spotting not any worse that the other day more light pink in nature   Objective:  GENERAL: Well-developed, well-nourished female in no acute distress.  HEENT: Normocephalic, atraumatic.   LUNGS: Effort normal HEART: Regular rate  SKIN: Warm, dry and without erythema PSYCH: Normal mood and affect  Filed Vitals:   04/26/14 0952 04/26/14 1154  BP: 117/74   Pulse: 102   Resp: 18   Height:  5' 5.5" (1.664 m)  Weight:  70.444 kg (155 lb 4.8 oz)    CLINICAL DATA: Concern for possible ectopic pregnancy.  EXAM:  OBSTETRIC <14 WK [place] AND TRANSVAGINAL OB US  TECHNIQUE:  Both transabdominal and transvaginal ultrasound examinations were  performed for complete evaluation of the gestation as well as the  maternal uterus, adnexal regions, and pelvic cul-de-sac.  Transvaginal technique was performed to assess early pregnancy.  COMPARISON: Pelvic ultrasound 04/10/2014; 04/06/2014.  FINDINGS:  Intrauterine gestational sac: Not visualized  Yolk sac: Not present  Embryo: Not present  Maternal uterus/adnexae: Within the right ovary there is a 4.3 x 4.7  x 4.5 cm cyst with suggestion of internal retracting clot,  suggestive of a large hemorrhagic cyst. Complicated fluid within the  right adnexa.  Adjacent to the left ovary is a 2.9 x 1.8 x 2.5 cm rounded mass. No  definite flow is demonstrated within this mass with color Doppler.  There is adjacent free fluid.  IMPRESSION:  No intrauterine pregnancy identified.  There is a rounded mass within the left adnexa adjacent to the left  ovary suggestive of an ectopic pregnancy. Moderate  amount of free  fluid in the pelvis.  Probable large hemorrhagic cyst within the right ovary with adjacent  complicated fluid.  Critical Value/emergent results were called by telephone at the time  of interpretation on 04/20/2014 at 5:52 pm to Dr. Wynelle Bourgeois ,  who verbally acknowledged these results.  Electronically Signed  By: Annia Belt M.D.  On: 04/20/2014 17:55  MDM:  Beta hcg day #1: 1080     Beta hcg day # 4: 621 Beta hcg day # 7: 611 Discussed lab findings with Dr. Adrian Blackwater; pt to receive second dose of methotrexate today.  2nd dose of betamethasone given on 04/26/2014  A:  Left ectopic pregnancy; 2nd dose of methotrexate given  P:  Discharge home in stable condition Pelvic rest Return to MAU with any increased pain/bleeding Follow up in MAU on Saturday Sept 5 for day #4 Return to MAU sooner if needed  Iona Hansen Kayman Snuffer, NP 04/26/2014 1:28 PM

## 2014-04-29 ENCOUNTER — Inpatient Hospital Stay (HOSPITAL_COMMUNITY)
Admission: AD | Admit: 2014-04-29 | Discharge: 2014-04-29 | Disposition: A | Discharge status: Home / Self Care | Payer: Medicaid Other | Source: Ambulatory Visit | Attending: Family Medicine | Admitting: Family Medicine

## 2014-04-29 DIAGNOSIS — O00109 Unspecified tubal pregnancy without intrauterine pregnancy: Secondary | ICD-10-CM | POA: Insufficient documentation

## 2014-04-29 DIAGNOSIS — O009 Unspecified ectopic pregnancy without intrauterine pregnancy: Secondary | ICD-10-CM

## 2014-04-29 LAB — HCG, QUANTITATIVE, PREGNANCY: HCG, BETA CHAIN, QUANT, S: 347 m[IU]/mL — AB (ref <5)

## 2014-04-29 NOTE — MAU Provider Note (Signed)
[name] is a 23 y.o. G2P0010 at [redacted]w[redacted]d who presents to MAU today for day #4 follow-up quant hCG after second dose of MTX. Patient states light bleeding today that is heavier than previously. She states cramping last night, but none now.   BP 117/75  Pulse 112  Temp(Src) 99 F (37.2 C) (Oral)  Resp 18  Ht  (1.651 m)  Wt 153 lb 4 oz (69.514 kg)  BMI 25.50 kg/m2  LMP 03/18/2014 GENERAL: Well-developed, well-nourished female in no acute distress.  HEENT: Normocephalic, atraumatic.   LUNGS: Effort normal HEART: Regular rate  SKIN: Warm, dry and without erythema PSYCH: Normal mood and affect  Results for ZOELLE, MARKUS (MRN [id number]) as of 04/29/2014 09:36  Ref. Range 04/26/2014 10:00 04/29/2014 08:52  hCG, Beta Chain, Quant, S Latest Range: <5 mIU/mL 611 (H) 347 (H)    A: Ectopic pregnancy s/p MTX x 2  P: Discharge home Ectopic precautions discussed Patient to follow-up in MAU on Tuesday 05/02/14 for day #7 labs or sooner if symptoms worsen  Marny Lowenstein, PA-C 04/29/2014 9:37 AM

## 2014-04-29 NOTE — Discharge Instructions (Signed)
Methotrexate Treatment for an Ectopic Pregnancy, Care After °Refer to this sheet in the next few weeks. These instructions provide you with information on caring for yourself after your procedure. Your health care provider may also give you more specific instructions. Your treatment has been planned according to current medical practices, but problems sometimes occur. Call your health care provider if you have any problems or questions after your procedure. °WHAT TO EXPECT AFTER THE PROCEDURE °You may have some abdominal cramping, vaginal bleeding, and fatigue in the first few days after taking methotrexate. Some other possible side effects of methotrexate include: °· Nausea. °· Vomiting. °· Diarrhea. °· Mouth sores. °· Swelling or irritation of the lining of your lungs (pneumonitis). °· Liver damage. °· Hair loss. °HOME CARE INSTRUCTIONS  °After you have received the methotrexate medicine, you need to be careful of your activities and watch your condition for several weeks. It may take 1 week before your hormone levels return to normal. °· Keep all follow-up appointments as directed by your health care provider. °· Avoid traveling too far away from your health care provider. °· Do not have sexual intercourse until your health care provider says it is safe to do so. °· You may resume your usual diet. °· Limit strenuous activity. °· Do not take folic acid, prenatal vitamins, or other vitamins that contain folic acid. °· Do not take aspirin, ibuprofen, or naproxen (nonsteroidal anti-inflammatory drugs [NSAIDs]). °· Do not drink alcohol. °SEEK MEDICAL CARE IF:  °· You cannot control your nausea and vomiting. °· You cannot control your diarrhea. °· You have sores in your mouth and want treatment. °· You need pain medicine for your abdominal pain. °· You have a rash. °· You are having a reaction to the medicine. °SEEK IMMEDIATE MEDICAL CARE IF:  °· You have increasing abdominal or pelvic pain. °· You notice increased  bleeding. °· You feel light-headed, or you faint. °· You have shortness of breath. °· Your heart rate increases. °· You have a cough. °· You have chills. °· You have a fever. °Document Released: 07/31/2011 Document Revised: 08/16/2013 Document Reviewed: 05/30/2013 °ExitCare® Patient Information ©2015 [employer]. This information is not intended to replace advice given to you by your health care provider. Make sure you discuss any questions you have with your health care provider. ° °

## 2014-04-29 NOTE — MAU Provider Note (Signed)
Attestation of Attending Supervision of Advanced Practitioner (PA/CNM/NP): Evaluation and management procedures were performed by the Advanced Practitioner under my supervision and collaboration.  I have reviewed the Advanced Practitioner's note and chart, and I agree with the management and plan.  Reva Bores, MD [facility] Attending 04/29/2014 10:13 AM

## 2014-04-29 NOTE — MAU Note (Addendum)
Pt states still having pain and bleeding. Bleeding slightly heavier now. Changing pad every time she goes to restroom for sanitary reasons.

## 2014-05-02 ENCOUNTER — Inpatient Hospital Stay (HOSPITAL_COMMUNITY)
Admission: AD | Admit: 2014-05-02 | Discharge: 2014-05-02 | Disposition: A | Discharge status: Home / Self Care | Payer: Medicaid Other | Source: Ambulatory Visit | Attending: Family Medicine | Admitting: Family Medicine

## 2014-05-02 DIAGNOSIS — O00109 Unspecified tubal pregnancy without intrauterine pregnancy: Secondary | ICD-10-CM | POA: Insufficient documentation

## 2014-05-02 DIAGNOSIS — O009 Unspecified ectopic pregnancy without intrauterine pregnancy: Secondary | ICD-10-CM

## 2014-05-02 LAB — HCG, QUANTITATIVE, PREGNANCY: hCG, Beta Chain, Quant, S: 110 m[IU]/mL — ABNORMAL HIGH (ref <5)

## 2014-05-02 NOTE — MAU Provider Note (Signed)
Attestation of Attending Supervision of Obstetric Fellow: Evaluation and management procedures were performed by the Obstetric Fellow under my supervision and collaboration.  I have reviewed the Obstetric Fellow's note and chart, and I agree with the management and plan.  Jacob Stinson, DO Attending Physician Faculty Practice, [facility]  

## 2014-05-02 NOTE — MAU Note (Signed)
Feeling better each day.  Cramping less, bleeding is getting less

## 2014-05-02 NOTE — MAU Note (Signed)
Day 7, post 2nd MTX

## 2014-05-02 NOTE — MAU Note (Signed)
Some pressure with urination.  

## 2014-05-02 NOTE — MAU Provider Note (Signed)
  History     CSN: [id number]  Arrival date and time: 05/02/14 4098   First Provider Initiated Contact with Patient 05/02/14 1004      Chief Complaint  Patient presents with  . Follow-up   HPI  Here for follow up of labs, s/p MTX x 2.  Bleeding improving, cramping also improving.  Past Medical History  Diagnosis Date  . Heart murmur     at birth  . Infection     UTI  . Anxiety     hx of meds    Past Surgical History  Procedure Laterality Date  . No past surgeries      Family History  Problem Relation Age of Onset  . Diabetes Mother   . Hypertension Father   . Hyperlipidemia Father   . Diabetes Maternal Grandfather   . Cancer Paternal Grandmother   . Cancer Paternal Grandfather   . Stroke Paternal Grandfather   . Heart disease Paternal Grandfather   . Hearing loss Neg Hx     History  Substance Use Topics  . Smoking status: Never Smoker   . Smokeless tobacco: Former Neurosurgeon  . Alcohol Use: No    Allergies:  Allergies  Allergen Reactions  . Augmentin [Amoxicillin-Pot Clavulanate] Nausea And Vomiting    Hot flashes   . Codeine Nausea Only    Prescriptions prior to admission  Medication Sig Dispense Refill  . acetaminophen (TYLENOL) 325 MG tablet Take 650 mg by mouth every 6 (six) hours as needed for headache.      Marland Kitchen aspirin-acetaminophen-caffeine (EXCEDRIN MIGRAINE) 250-250-65 MG per tablet Take 2 tablets by mouth every 6 (six) hours as needed for headache or migraine.      Marland Kitchen oxyCODONE-acetaminophen (PERCOCET/ROXICET) 5-325 MG per tablet Take 1-2 tablets by mouth every 6 (six) hours as needed.  15 tablet  0  . promethazine (PHENERGAN) 25 MG tablet Take 1 tablet (25 mg total) by mouth every 6 (six) hours as needed for nausea or vomiting.  30 tablet  0    Review of Systems  Constitutional: Negative for chills and weight loss.  Gastrointestinal: Negative for nausea, vomiting and abdominal pain.   Physical Exam   Blood pressure 128/65, pulse 76,  temperature 98.9 F (37.2 C), temperature source Oral, resp. rate 18, last menstrual period 03/18/2014.  Physical Exam  Constitutional: She is oriented to person, place, and time. She appears well-developed and well-nourished.  HENT:  Head: Normocephalic and atraumatic.  Eyes: Conjunctivae and EOM are normal.  Neck: Normal range of motion.  Cardiovascular: Normal rate.   Respiratory: Effort normal. No respiratory distress.  GI: Soft. She exhibits no distension. There is no tenderness.  Neurological: She is alert and oriented to person, place, and time.    MAU Course  Procedures  MDM Quant 347 ==> 110  Assessment and Plan  Declines to follow up in clinic and would prefer to follow up here in MAU in 1 week   Marye Eagen ROCIO 05/02/2014, 10:14 AM

## 2014-05-02 NOTE — Discharge Instructions (Signed)
Ectopic Pregnancy °An ectopic pregnancy happens when a fertilized egg grows outside the uterus. A pregnancy cannot live outside of the uterus. This problem often happens in the fallopian tube. It is often caused by damage to the fallopian tube. °If this problem is found early, you may be treated with medicine. If your tube tears or bursts open (ruptures), you will bleed inside. This is an emergency. You will need surgery. Get help right away.  °SYMPTOMS °You may have normal pregnancy symptoms at first. These include: °· Missing your period. °· Feeling sick to your stomach (nauseous). °· Being tired. °· Having tender breasts. °Then, you may start to have symptoms that are not normal. These include: °· Pain with sex (intercourse). °· Bleeding from the vagina. This includes light bleeding (spotting). °· Belly (abdomen) or lower belly cramping or pain. This may be felt on one side. °· A fast heartbeat (pulse). °· Passing out (fainting) after going poop (bowel movement). °If your tube tears, you may have symptoms such as: °· Really bad pain in the belly or lower belly. This happens suddenly. °· Dizziness. °· Passing out. °· Shoulder pain. °GET HELP RIGHT AWAY IF:  °You have any of these symptoms. This is an emergency. °MAKE SURE YOU: °· Understand these instructions. °· Will watch your condition. °· Will get help right away if you are not doing well or get worse. °Document Released: 11/07/2008 Document Revised: 08/16/2013 Document Reviewed: 03/23/2013 °Exit[employer]® Patient Information ©2015 [employer]. This information is not intended to replace advice given to you by your health care provider. Make sure you discuss any questions you have with your health care provider. ° °

## 2014-05-09 ENCOUNTER — Inpatient Hospital Stay (HOSPITAL_COMMUNITY)
Admission: AD | Admit: 2014-05-09 | Discharge: 2014-05-09 | Disposition: A | Discharge status: Home / Self Care | Payer: Medicaid Other | Source: Ambulatory Visit | Attending: Family Medicine | Admitting: Family Medicine

## 2014-05-09 ENCOUNTER — Encounter (HOSPITAL_COMMUNITY): Payer: Self-pay

## 2014-05-09 DIAGNOSIS — O00109 Unspecified tubal pregnancy without intrauterine pregnancy: Secondary | ICD-10-CM | POA: Diagnosis not present

## 2014-05-09 DIAGNOSIS — Z8742 Personal history of other diseases of the female genital tract: Secondary | ICD-10-CM

## 2014-05-09 DIAGNOSIS — Z8759 Personal history of other complications of pregnancy, childbirth and the puerperium: Secondary | ICD-10-CM

## 2014-05-09 LAB — HCG, QUANTITATIVE, PREGNANCY: HCG, BETA CHAIN, QUANT, S: 5 m[IU]/mL — AB (ref <5)

## 2014-05-09 NOTE — MAU Provider Note (Signed)
  History     CSN: [id number]  Arrival date and time: 05/09/14 9147   None     Chief Complaint  Patient presents with  . Follow-up   HPI  Pt is 23 yo G2P0010 here for follow-up weekly BHCG after 2 methotrexate injections.  Last BHCG was 110 on 05/02/14. Pt denies abdominal pain or cramping.     Past Medical History  Diagnosis Date  . Heart murmur     at birth  . Infection     UTI  . Anxiety     hx of meds    Past Surgical History  Procedure Laterality Date  . No past surgeries      Family History  Problem Relation Age of Onset  . Diabetes Mother   . Hypertension Father   . Hyperlipidemia Father   . Diabetes Maternal Grandfather   . Cancer Paternal Grandmother   . Cancer Paternal Grandfather   . Stroke Paternal Grandfather   . Heart disease Paternal Grandfather   . Hearing loss Neg Hx     History  Substance Use Topics  . Smoking status: Never Smoker   . Smokeless tobacco: Former Neurosurgeon  . Alcohol Use: No    Allergies:  Allergies  Allergen Reactions  . Augmentin [Amoxicillin-Pot Clavulanate] Nausea And Vomiting    Hot flashes   . Codeine Nausea Only    Prescriptions prior to admission  Medication Sig Dispense Refill  . acetaminophen (TYLENOL) 325 MG tablet Take 650 mg by mouth every 6 (six) hours as needed for headache.      Marland Kitchen aspirin-acetaminophen-caffeine (EXCEDRIN MIGRAINE) 250-250-65 MG per tablet Take 2 tablets by mouth every 6 (six) hours as needed for headache or migraine.      Marland Kitchen oxyCODONE-acetaminophen (PERCOCET/ROXICET) 5-325 MG per tablet Take 1-2 tablets by mouth every 6 (six) hours as needed.  15 tablet  0  . promethazine (PHENERGAN) 25 MG tablet Take 1 tablet (25 mg total) by mouth every 6 (six) hours as needed for nausea or vomiting.  30 tablet  0    ROS Pertinent information in HPI  Physical Exam   Blood pressure 113/71, pulse 68, temperature 98.7 F (37.1 C), temperature source Oral, resp. rate 16, last menstrual period 03/18/2014,  SpO2 100.00%, unknown if currently breastfeeding.  Physical Exam  Constitutional: She is oriented to person, place, and time. She appears well-developed and well-nourished. No distress.  HENT:  Head: Normocephalic.  Neck: Normal range of motion. Neck supple.  Neurological: She is alert and oriented to person, place, and time. She has normal reflexes.  Skin: Skin is warm and dry.    MAU Course  Procedures  Results for orders placed during the hospital encounter of 05/09/14 (from the past 24 hour(s))  HCG, QUANTITATIVE, PREGNANCY     Status: Abnormal   Collection Time    05/09/14  9:09 AM      Result Value Ref Range   hCG, Beta Chain, Quant, S 5 (*) <5 mIU/mL   Consulted with Dr. Adrian Blackwater > have patient return for one more weekly BHCG  Assessment and Plan  Suspected Left Ectopic Pregnancy - S/P MTX x 2  Plan: Discharge to home Repeat lab in one week (pt prefers to come to MAU versus clinic) Ectopic precautions  KARIM, Sheela Mcculley N 05/09/2014, 10:00 AM

## 2014-05-09 NOTE — MAU Note (Signed)
Patient to MAU for weekly BHCG s/p MTX. Denies pain or bleeding.  

## 2014-05-10 NOTE — MAU Provider Note (Signed)
Attestation of Attending Supervision of Advanced Practitioner (PA/CNM/NP): Evaluation and management procedures were performed by the Advanced Practitioner under my supervision and collaboration.  I have reviewed the Advanced Practitioner's note and chart, and I agree with the management and plan.  Quamere Mussell, DO Attending Physician Faculty Practice, [facility]  

## 2014-05-15 ENCOUNTER — Telehealth: Payer: Self-pay | Admitting: Advanced Practice Midwife

## 2014-05-15 NOTE — Telephone Encounter (Signed)
Pt called to report symptoms of BV returned, questions about safety of Flagyl while following quant hcg for ectopic.  She has Rx that she did not take from [facility]. Discussed safety of Flagyl with pt, plan to take full course x7 days.  Call clinic or come to MAU if symptoms persist.

## 2014-05-16 ENCOUNTER — Inpatient Hospital Stay (HOSPITAL_COMMUNITY)
Admission: AD | Admit: 2014-05-16 | Discharge: 2014-05-16 | Disposition: A | Discharge status: Home / Self Care | Payer: Medicaid Other | Source: Ambulatory Visit | Attending: Obstetrics & Gynecology | Admitting: Obstetrics & Gynecology

## 2014-05-16 DIAGNOSIS — O009 Unspecified ectopic pregnancy without intrauterine pregnancy: Secondary | ICD-10-CM

## 2014-05-16 DIAGNOSIS — O00109 Unspecified tubal pregnancy without intrauterine pregnancy: Secondary | ICD-10-CM | POA: Insufficient documentation

## 2014-05-16 LAB — HCG, QUANTITATIVE, PREGNANCY: hCG, Beta Chain, Quant, S: 1 m[IU]/mL (ref <5)

## 2014-05-16 NOTE — MAU Provider Note (Signed)
S: Methotraxate was used end of August x 2.  She is here for follow up blood work.    O: HCG level of 1  A: HCG level returned to normal.  No further concern  P: Discharge to home Follow up in clinic for contraception/vaginal health concerns Return to MAU for emergency

## 2014-05-16 NOTE — MAU Note (Signed)
Patient to MAU for weekly BHCG. Denies pain or bleeding. Does have a vaginal discharge and thinks it is BV.

## 2014-05-16 NOTE — Discharge Instructions (Signed)
Ectopic Pregnancy °An ectopic pregnancy happens when a fertilized egg grows outside the uterus. A pregnancy cannot live outside of the uterus. This problem often happens in the fallopian tube. It is often caused by damage to the fallopian tube. °If this problem is found early, you may be treated with medicine. If your tube tears or bursts open (ruptures), you will bleed inside. This is an emergency. You will need surgery. Get help right away.  °SYMPTOMS °You may have normal pregnancy symptoms at first. These include: °· Missing your period. °· Feeling sick to your stomach (nauseous). °· Being tired. °· Having tender breasts. °Then, you may start to have symptoms that are not normal. These include: °· Pain with sex (intercourse). °· Bleeding from the vagina. This includes light bleeding (spotting). °· Belly (abdomen) or lower belly cramping or pain. This may be felt on one side. °· A fast heartbeat (pulse). °· Passing out (fainting) after going poop (bowel movement). °If your tube tears, you may have symptoms such as: °· Really bad pain in the belly or lower belly. This happens suddenly. °· Dizziness. °· Passing out. °· Shoulder pain. °GET HELP RIGHT AWAY IF:  °You have any of these symptoms. This is an emergency. °MAKE SURE YOU: °· Understand these instructions. °· Will watch your condition. °· Will get help right away if you are not doing well or get worse. °Document Released: 11/07/2008 Document Revised: 08/16/2013 Document Reviewed: 03/23/2013 °Exit[employer]® Patient Information ©2015 [employer]. This information is not intended to replace advice given to you by your health care provider. Make sure you discuss any questions you have with your health care provider. ° °

## 2014-06-15 ENCOUNTER — Telehealth (HOSPITAL_COMMUNITY): Payer: Self-pay

## 2014-06-18 ENCOUNTER — Telehealth (HOSPITAL_BASED_OUTPATIENT_CLINIC_OR_DEPARTMENT_OTHER): Payer: Self-pay | Admitting: Emergency Medicine

## 2014-06-26 ENCOUNTER — Encounter (HOSPITAL_COMMUNITY): Payer: Self-pay

## 2014-07-07 ENCOUNTER — Inpatient Hospital Stay (HOSPITAL_COMMUNITY)
Admission: AD | Admit: 2014-07-07 | Discharge: 2014-07-07 | Disposition: A | Discharge status: Home / Self Care | Payer: Medicaid Other | Source: Ambulatory Visit | Attending: Obstetrics and Gynecology | Admitting: Obstetrics and Gynecology

## 2014-07-07 ENCOUNTER — Encounter (HOSPITAL_COMMUNITY): Payer: Self-pay | Admitting: *Deleted

## 2014-07-07 DIAGNOSIS — R1032 Left lower quadrant pain: Secondary | ICD-10-CM | POA: Insufficient documentation

## 2014-07-07 LAB — URINALYSIS, ROUTINE W REFLEX MICROSCOPIC
BILIRUBIN URINE: NEGATIVE
Glucose, UA: NEGATIVE mg/dL
HGB URINE DIPSTICK: NEGATIVE
KETONES UR: NEGATIVE mg/dL
Leukocytes, UA: NEGATIVE
NITRITE: NEGATIVE
PROTEIN: NEGATIVE mg/dL
SPECIFIC GRAVITY, URINE: 1.015 (ref 1.005–1.030)
Urobilinogen, UA: 0.2 mg/dL (ref 0.0–1.0)
pH: 6.5 (ref 5.0–8.0)

## 2014-07-07 LAB — POCT PREGNANCY, URINE: Preg Test, Ur: NEGATIVE

## 2014-07-07 LAB — WET PREP, GENITAL
Clue Cells Wet Prep HPF POC: NONE SEEN
Trich, Wet Prep: NONE SEEN
WBC WET PREP: NONE SEEN
Yeast Wet Prep HPF POC: NONE SEEN

## 2014-07-07 NOTE — MAU Provider Note (Signed)
History     CSN: [id number]  Arrival date and time: 07/07/14 1600   First Provider Initiated Contact with Patient 07/07/14 1731      Chief Complaint  Patient presents with  . Dysuria  . Vaginal Discharge   HPI Carrie [name] 23 y.o.G2P0020 nonpregnant female presents to MAU complaining of BV and UTI.  She started Cipro 3 days ago (1 QD) but only had 5 pills of Cipro.    She also started Flagyl twice daily but has no more pills left.   She reports having yellowish mucus inside her vagina but reports it does not come out on its own.  She has mild lower abdominal pain in the LLQ, increased urinary frequency, tiredness and a chilled feeling.  LMP was 06/25/14.  She denies vaginal bleeding, dysuria, headache, weakness, nausea, vomiting. OB History    Gravida Para Term Preterm AB TAB SAB Ectopic Multiple Living   2 0 0 0 1 1 0         Past Medical History  Diagnosis Date  . Heart murmur     at birth  . Infection     UTI  . Anxiety     hx of meds    Past Surgical History  Procedure Laterality Date  . No past surgeries      Family History  Problem Relation Age of Onset  . Diabetes Mother   . Hypertension Father   . Hyperlipidemia Father   . Diabetes Maternal Grandfather   . Cancer Paternal Grandmother   . Cancer Paternal Grandfather   . Stroke Paternal Grandfather   . Heart disease Paternal Grandfather   . Hearing loss Neg Hx     History  Substance Use Topics  . Smoking status: Never Smoker   . Smokeless tobacco: Former NeurosurgeonUser  . Alcohol Use: No    Allergies:  Allergies  Allergen Reactions  . Augmentin [Amoxicillin-Pot Clavulanate] Nausea And Vomiting    Hot flashes   . Codeine Nausea Only    Prescriptions prior to admission  Medication Sig Dispense Refill Last Dose  . acetaminophen (TYLENOL) 325 MG tablet Take 650 mg by mouth every 6 (six) hours as needed for headache.   Past Week at Unknown time  . aspirin-acetaminophen-caffeine (EXCEDRIN MIGRAINE)  250-250-65 MG per tablet Take 2 tablets by mouth every 6 (six) hours as needed for headache or migraine.   Past Month at Unknown time    ROS Pertinent ROS in HPI Physical Exam   Blood pressure 123/78, pulse 73, temperature 98.3 F (36.8 C), temperature source Oral, resp. rate 16, last menstrual period 06/25/2014, unknown if currently breastfeeding.  Physical Exam  Constitutional: She is oriented to person, place, and time. She appears well-developed and well-nourished. No distress.  HENT:  Head: Normocephalic and atraumatic.  Eyes: EOM are normal.  Neck: Normal range of motion.  Cardiovascular: Normal rate, regular rhythm and normal heart sounds.   Respiratory: Effort normal and breath sounds normal. No respiratory distress.  GI: Soft. Bowel sounds are normal. She exhibits no distension. There is tenderness. There is no rebound and no guarding.  Mild TTP in LLQ  Genitourinary:  Scant white discharge in vagina.   Small amt of clear mucus visible in cervical os.   No CMT/adnexal pain or tenderness.  Musculoskeletal: Normal range of motion.  Neurological: She is alert and oriented to person, place, and time.  Skin: Skin is warm and dry.  Psychiatric: She has a normal mood and affect.  MAU Course  Procedures  MDM No evidence of UTI on U/A at this time.   No evidence of vaginal infection on exam or with laboratory findings Based on timing relative to LMP, LLQ pain is likely mittelschmerz  Assessment and Plan  A:  1. Abdominal pain, left lower quadrant     P: Discharge to home Increase po fluids OTC Ibuprofen prn Patient advised if planning to seek medical counsel for discomforts, do so prior to initiating antibiotics.   Patient may return to MAU as needed or if her condition were to change or worsen   Bertram Denvereague Clark, Karen E 07/07/2014, 5:35 PM

## 2014-07-07 NOTE — Discharge Instructions (Signed)
Abdominal Pain, Women °Abdominal (stomach, pelvic, or belly) pain can be caused by many things. It is important to tell your doctor: °· The location of the pain. °· Does it come and go or is it present all the time? °· Are there things that start the pain (eating certain foods, exercise)? °· Are there other symptoms associated with the pain (fever, nausea, vomiting, diarrhea)? °All of this is helpful to know when trying to find the cause of the pain. °CAUSES  °· Stomach: virus or bacteria infection, or ulcer. °· Intestine: appendicitis (inflamed appendix), regional ileitis (Crohn's disease), ulcerative colitis (inflamed colon), irritable bowel syndrome, diverticulitis (inflamed diverticulum of the colon), or cancer of the stomach or intestine. °· Gallbladder disease or stones in the gallbladder. °· Kidney disease, kidney stones, or infection. °· Pancreas infection or cancer. °· Fibromyalgia (pain disorder). °· Diseases of the female organs: °¨ Uterus: fibroid (non-cancerous) tumors or infection. °¨ Fallopian tubes: infection or tubal pregnancy. °¨ Ovary: cysts or tumors. °¨ Pelvic adhesions (scar tissue). °¨ Endometriosis (uterus lining tissue growing in the pelvis and on the pelvic organs). °¨ Pelvic congestion syndrome (female organs filling up with blood just before the menstrual period). °¨ Pain with the menstrual period. °¨ Pain with ovulation (producing an egg). °¨ Pain with an IUD (intrauterine device, birth control) in the uterus. °¨ Cancer of the female organs. °· Functional pain (pain not caused by a disease, may improve without treatment). °· Psychological pain. °· Depression. °DIAGNOSIS  °Your doctor will decide the seriousness of your pain by doing an examination. °· Blood tests. °· X-rays. °· Ultrasound. °· CT scan (computed tomography, special type of X-ray). °· MRI (magnetic resonance imaging). °· Cultures, for infection. °· Barium enema (dye inserted in the large intestine, to better view it with  X-rays). °· Colonoscopy (looking in intestine with a lighted tube). °· Laparoscopy (minor surgery, looking in abdomen with a lighted tube). °· Major abdominal exploratory surgery (looking in abdomen with a large incision). °TREATMENT  °The treatment will depend on the cause of the pain.  °· Many cases can be observed and treated at home. °· Over-the-counter medicines recommended by your caregiver. °· Prescription medicine. °· Antibiotics, for infection. °· Birth control pills, for painful periods or for ovulation pain. °· Hormone treatment, for endometriosis. °· Nerve blocking injections. °· Physical therapy. °· Antidepressants. °· Counseling with a psychologist or psychiatrist. °· Minor or major surgery. °HOME CARE INSTRUCTIONS  °· Do not take laxatives, unless directed by your caregiver. °· Take over-the-counter pain medicine only if ordered by your caregiver. Do not take aspirin because it can cause an upset stomach or bleeding. °· Try a clear liquid diet (broth or water) as ordered by your caregiver. Slowly move to a bland diet, as tolerated, if the pain is related to the stomach or intestine. °· Have a thermometer and take your temperature several times a day, and record it. °· Bed rest and sleep, if it helps the pain. °· Avoid sexual intercourse, if it causes pain. °· Avoid stressful situations. °· Keep your follow-up appointments and tests, as your caregiver orders. °· If the pain does not go away with medicine or surgery, you may try: °¨ Acupuncture. °¨ Relaxation exercises (yoga, meditation). °¨ Group therapy. °¨ Counseling. °SEEK MEDICAL CARE IF:  °· You notice certain foods cause stomach pain. °· Your home care treatment is not helping your pain. °· You need stronger pain medicine. °· You want your IUD removed. °· You feel faint or   lightheaded. °· You develop nausea and vomiting. °· You develop a rash. °· You are having side effects or an allergy to your medicine. °SEEK IMMEDIATE MEDICAL CARE IF:  °· Your  pain does not go away or gets worse. °· You have a fever. °· Your pain is felt only in portions of the abdomen. The right side could possibly be appendicitis. The left lower portion of the abdomen could be colitis or diverticulitis. °· You are passing blood in your stools (bright red or black tarry stools, with or without vomiting). °· You have blood in your urine. °· You develop chills, with or without a fever. °· You pass out. °MAKE SURE YOU:  °· Understand these instructions. °· Will watch your condition. °· Will get help right away if you are not doing well or get worse. °Document Released: 06/08/2007 Document Revised: 12/26/2013 Document Reviewed: 06/28/2009 °[employer]® Patient Information ©2015 [employer]. This information is not intended to replace advice given to you by your health care provider. Make sure you discuss any questions you have with your health care provider. ° °

## 2014-07-07 NOTE — MAU Note (Signed)
Recurrent UTI and BV.  Pressure in back.  Has been taking AZO.  Trying to find a GYN

## 2014-07-08 LAB — GC/CHLAMYDIA PROBE AMP
CT PROBE, AMP APTIMA: NEGATIVE
GC Probe RNA: NEGATIVE

## 2015-01-24 ENCOUNTER — Inpatient Hospital Stay (HOSPITAL_COMMUNITY)
Admission: AD | Admit: 2015-01-24 | Discharge: 2015-01-24 | Disposition: A | Discharge status: Home / Self Care | Payer: Medicaid Other | Source: Ambulatory Visit | Attending: Family Medicine | Admitting: Family Medicine

## 2015-01-24 ENCOUNTER — Encounter (HOSPITAL_COMMUNITY): Payer: Self-pay | Admitting: *Deleted

## 2015-01-24 DIAGNOSIS — R102 Pelvic and perineal pain: Secondary | ICD-10-CM | POA: Diagnosis not present

## 2015-01-24 DIAGNOSIS — S39011A Strain of muscle, fascia and tendon of abdomen, initial encounter: Secondary | ICD-10-CM

## 2015-01-24 DIAGNOSIS — N940 Mittelschmerz: Secondary | ICD-10-CM | POA: Diagnosis not present

## 2015-01-24 DIAGNOSIS — Z886 Allergy status to analgesic agent status: Secondary | ICD-10-CM | POA: Insufficient documentation

## 2015-01-24 DIAGNOSIS — R109 Unspecified abdominal pain: Secondary | ICD-10-CM | POA: Diagnosis present

## 2015-01-24 LAB — URINALYSIS, ROUTINE W REFLEX MICROSCOPIC
BILIRUBIN URINE: NEGATIVE
GLUCOSE, UA: NEGATIVE mg/dL
HGB URINE DIPSTICK: NEGATIVE
Ketones, ur: 15 mg/dL — AB
LEUKOCYTES UA: NEGATIVE
Nitrite: NEGATIVE
PH: 6 (ref 5.0–8.0)
PROTEIN: NEGATIVE mg/dL
SPECIFIC GRAVITY, URINE: 1.02 (ref 1.005–1.030)
Urobilinogen, UA: 0.2 mg/dL (ref 0.0–1.0)

## 2015-01-24 LAB — WET PREP, GENITAL
CLUE CELLS WET PREP: NONE SEEN
Trich, Wet Prep: NONE SEEN
WBC, Wet Prep HPF POC: NONE SEEN
Yeast Wet Prep HPF POC: NONE SEEN

## 2015-01-24 LAB — POCT PREGNANCY, URINE: PREG TEST UR: NEGATIVE

## 2015-01-24 NOTE — MAU Note (Signed)
Pt presents complaining of pain in her lower abdomen that is mostly on the right side. States it is worse when she walks. Pt also reports nausea. Denies vaginal bleeding or abnormal discharge.

## 2015-01-24 NOTE — MAU Provider Note (Signed)
History     CSN: [id number]  Arrival date and time: 01/24/15 1502   None     Chief Complaint  Patient presents with  . Abdominal Pain   HPI   [name] is a 24 y.o. female G2P0020 who presents with abdominal pain that is located in her right lower to right upper quadrant. The pain radiates to the other side of her abdomen. The pain started 4 days ago and worsens when she walks. She had this pain the last time she had an ectopic pregnancy and is very nervous about this happening again.  She has taken tylenol for the pain and it did not help. A bath helped the pain.  She currently rates her pain 8/10 when she walks.   LMP was 5/14; she has regular 28-30 days cycles   She has been doing ab workouts everyday recently and wonders if she pulled a muscle.   OB History    Gravida Para Term Preterm AB TAB SAB Ectopic Multiple Living   2 0 0 0 2 1 0 1        Past Medical History  Diagnosis Date  . Heart murmur     at birth  . Infection     UTI  . Anxiety     hx of meds    Past Surgical History  Procedure Laterality Date  . No past surgeries      Family History  Problem Relation Age of Onset  . Diabetes Mother   . Hypertension Father   . Hyperlipidemia Father   . Diabetes Maternal Grandfather   . Cancer Paternal Grandmother   . Cancer Paternal Grandfather   . Stroke Paternal Grandfather   . Heart disease Paternal Grandfather   . Hearing loss Neg Hx     History  Substance Use Topics  . Smoking status: Never Smoker   . Smokeless tobacco: Former NeurosurgeonUser  . Alcohol Use: No    Allergies:  Allergies  Allergen Reactions  . Augmentin [Amoxicillin-Pot Clavulanate] Nausea And Vomiting    Hot flashes   . Codeine Nausea Only    Prescriptions prior to admission  Medication Sig Dispense Refill Last Dose  . acetaminophen (TYLENOL) 325 MG tablet Take 650 mg by mouth every 6 (six) hours as needed for headache.   Past Week at Unknown time  .  aspirin-acetaminophen-caffeine (EXCEDRIN MIGRAINE) 250-250-65 MG per tablet Take 2 tablets by mouth every 6 (six) hours as needed for headache or migraine.   Past Month at Unknown time   Results for orders placed or performed during the hospital encounter of 01/24/15 (from the past 48 hour(s))  Urinalysis, Routine w reflex microscopic ([facility])     Status: Abnormal   Collection Time: 01/24/15  3:10 PM  Result Value Ref Range   Color, Urine YELLOW YELLOW   APPearance CLEAR CLEAR   Specific Gravity, Urine 1.020 1.005 - 1.030   pH 6.0 5.0 - 8.0   Glucose, UA NEGATIVE NEGATIVE mg/dL   Hgb urine dipstick NEGATIVE NEGATIVE   Bilirubin Urine NEGATIVE NEGATIVE   Ketones, ur 15 (A) NEGATIVE mg/dL   Protein, ur NEGATIVE NEGATIVE mg/dL   Urobilinogen, UA 0.2 0.0 - 1.0 mg/dL   Nitrite NEGATIVE NEGATIVE   Leukocytes, UA NEGATIVE NEGATIVE    Comment: MICROSCOPIC NOT DONE ON URINES WITH NEGATIVE PROTEIN, BLOOD, LEUKOCYTES, NITRITE, OR GLUCOSE <1000 mg/dL.  Pregnancy, urine POC     Status: None   Collection Time: 01/24/15  3:35 PM  Result Value Ref Range   Preg Test, Ur NEGATIVE NEGATIVE    Comment:        THE SENSITIVITY OF THIS METHODOLOGY IS >24 mIU/mL   Wet prep, genital     Status: None   Collection Time: 01/24/15  4:10 PM  Result Value Ref Range   Yeast Wet Prep HPF POC NONE SEEN NONE SEEN   Trich, Wet Prep NONE SEEN NONE SEEN   Clue Cells Wet Prep HPF POC NONE SEEN NONE SEEN   WBC, Wet Prep HPF POC NONE SEEN NONE SEEN    Comment: MODERATE BACTERIA SEEN    Review of Systems  Constitutional: Negative for fever.  Gastrointestinal: Positive for nausea and abdominal pain. Negative for vomiting.  Genitourinary: Negative for dysuria, urgency and frequency.       + Vaginal discharge  Denies vaginal bleeding    Physical Exam   Blood pressure 121/81, pulse 85, temperature 98.8 F (37.1 C), temperature source Oral, resp. rate 18, height  (1.651 m), weight 71.215 kg (157 lb), last  menstrual period 01/06/2015, unknown if currently breastfeeding.  Physical Exam  Constitutional: She is oriented to person, place, and time. She appears well-developed and well-nourished. No distress.  HENT:  Head: Normocephalic.  Eyes: Pupils are equal, round, and reactive to light.  Neck: Neck supple.  GI: Soft. She exhibits no distension and no mass. There is no tenderness. There is no rebound and no guarding.  Genitourinary: Vagina normal. No vaginal discharge found.  Speculum exam: Vagina - Small amount of thick, white discharge, no odor Cervix - No contact bleeding Bimanual exam: Cervix closed, no CMT  Uterus non tender, normal size Adnexa non tender, no masses bilaterally GC/Chlam, wet prep done Chaperone present for exam.  Musculoskeletal: Normal range of motion.  Neurological: She is alert and oriented to person, place, and time.  Skin: Skin is warm. She is not diaphoretic.  Psychiatric: Her behavior is normal.    MAU Course  Procedures  MDM I offered the patient ibuprofen or flexeril and the patient declined.   Assessment and Plan    A:  1. Pelvic pain in female   2. Abdominal muscle strain, initial encounter vs. [place]     P:  Discharge home in stable condition.  Ok to take ibuprofen as directed on the bottle Discussed the importance of GYN care.  Go to [facility] or [facility] if symptoms worsen   Duane Lope, NP 01/24/2015 7:19 PM

## 2015-01-24 NOTE — Discharge Instructions (Signed)
Abdominal Pain, Women °Abdominal (stomach, pelvic, or belly) pain can be caused by many things. It is important to tell your doctor: °· The location of the pain. °· Does it come and go or is it present all the time? °· Are there things that start the pain (eating certain foods, exercise)? °· Are there other symptoms associated with the pain (fever, nausea, vomiting, diarrhea)? °All of this is helpful to know when trying to find the cause of the pain. °CAUSES  °· Stomach: virus or bacteria infection, or ulcer. °· Intestine: appendicitis (inflamed appendix), regional ileitis (Crohn's disease), ulcerative colitis (inflamed colon), irritable bowel syndrome, diverticulitis (inflamed diverticulum of the colon), or cancer of the stomach or intestine. °· Gallbladder disease or stones in the gallbladder. °· Kidney disease, kidney stones, or infection. °· Pancreas infection or cancer. °· Fibromyalgia (pain disorder). °· Diseases of the female organs: °¨ Uterus: fibroid (non-cancerous) tumors or infection. °¨ Fallopian tubes: infection or tubal pregnancy. °¨ Ovary: cysts or tumors. °¨ Pelvic adhesions (scar tissue). °¨ Endometriosis (uterus lining tissue growing in the pelvis and on the pelvic organs). °¨ Pelvic congestion syndrome (female organs filling up with blood just before the menstrual period). °¨ Pain with the menstrual period. °¨ Pain with ovulation (producing an egg). °¨ Pain with an IUD (intrauterine device, birth control) in the uterus. °¨ Cancer of the female organs. °· Functional pain (pain not caused by a disease, may improve without treatment). °· Psychological pain. °· Depression. °DIAGNOSIS  °Your doctor will decide the seriousness of your pain by doing an examination. °· Blood tests. °· X-rays. °· Ultrasound. °· CT scan (computed tomography, special type of X-ray). °· MRI (magnetic resonance imaging). °· Cultures, for infection. °· Barium enema (dye inserted in the large intestine, to better view it with  X-rays). °· Colonoscopy (looking in intestine with a lighted tube). °· Laparoscopy (minor surgery, looking in abdomen with a lighted tube). °· Major abdominal exploratory surgery (looking in abdomen with a large incision). °TREATMENT  °The treatment will depend on the cause of the pain.  °· Many cases can be observed and treated at home. °· Over-the-counter medicines recommended by your caregiver. °· Prescription medicine. °· Antibiotics, for infection. °· Birth control pills, for painful periods or for ovulation pain. °· Hormone treatment, for endometriosis. °· Nerve blocking injections. °· Physical therapy. °· Antidepressants. °· Counseling with a psychologist or psychiatrist. °· Minor or major surgery. °HOME CARE INSTRUCTIONS  °· Do not take laxatives, unless directed by your caregiver. °· Take over-the-counter pain medicine only if ordered by your caregiver. Do not take aspirin because it can cause an upset stomach or bleeding. °· Try a clear liquid diet (broth or water) as ordered by your caregiver. Slowly move to a bland diet, as tolerated, if the pain is related to the stomach or intestine. °· Have a thermometer and take your temperature several times a day, and record it. °· Bed rest and sleep, if it helps the pain. °· Avoid sexual intercourse, if it causes pain. °· Avoid stressful situations. °· Keep your follow-up appointments and tests, as your caregiver orders. °· If the pain does not go away with medicine or surgery, you may try: °¨ Acupuncture. °¨ Relaxation exercises (yoga, meditation). °¨ Group therapy. °¨ Counseling. °SEEK MEDICAL CARE IF:  °· You notice certain foods cause stomach pain. °· Your home care treatment is not helping your pain. °· You need stronger pain medicine. °· You want your IUD removed. °· You feel faint or   lightheaded. °· You develop nausea and vomiting. °· You develop a rash. °· You are having side effects or an allergy to your medicine. °SEEK IMMEDIATE MEDICAL CARE IF:  °· Your  pain does not go away or gets worse. °· You have a fever. °· Your pain is felt only in portions of the abdomen. The right side could possibly be appendicitis. The left lower portion of the abdomen could be colitis or diverticulitis. °· You are passing blood in your stools (bright red or black tarry stools, with or without vomiting). °· You have blood in your urine. °· You develop chills, with or without a fever. °· You pass out. °MAKE SURE YOU:  °· Understand these instructions. °· Will watch your condition. °· Will get help right away if you are not doing well or get worse. °Document Released: 06/08/2007 Document Revised: 12/26/2013 Document Reviewed: 06/28/2009 °[employer]® Patient Information ©2015 [employer]. This information is not intended to replace advice given to you by your health care provider. Make sure you discuss any questions you have with your health care provider. ° °

## 2015-01-25 LAB — GC/CHLAMYDIA PROBE AMP (~~LOC~~) NOT AT ARMC
Chlamydia: NEGATIVE
Neisseria Gonorrhea: NEGATIVE

## 2015-07-25 ENCOUNTER — Inpatient Hospital Stay (HOSPITAL_COMMUNITY)
Admission: AD | Admit: 2015-07-25 | Discharge: 2015-07-25 | Disposition: A | Discharge status: Home / Self Care | Payer: Self-pay | Source: Ambulatory Visit | Attending: Obstetrics & Gynecology | Admitting: Obstetrics & Gynecology

## 2015-07-25 ENCOUNTER — Inpatient Hospital Stay (HOSPITAL_COMMUNITY): Payer: Medicaid Other

## 2015-07-25 ENCOUNTER — Encounter (HOSPITAL_COMMUNITY): Payer: Self-pay | Admitting: *Deleted

## 2015-07-25 DIAGNOSIS — O26891 Other specified pregnancy related conditions, first trimester: Secondary | ICD-10-CM | POA: Insufficient documentation

## 2015-07-25 DIAGNOSIS — Z880 Allergy status to penicillin: Secondary | ICD-10-CM | POA: Insufficient documentation

## 2015-07-25 DIAGNOSIS — Z3A01 Less than 8 weeks gestation of pregnancy: Secondary | ICD-10-CM | POA: Insufficient documentation

## 2015-07-25 DIAGNOSIS — O3680X0 Pregnancy with inconclusive fetal viability, not applicable or unspecified: Secondary | ICD-10-CM

## 2015-07-25 DIAGNOSIS — R102 Pelvic and perineal pain: Secondary | ICD-10-CM | POA: Insufficient documentation

## 2015-07-25 DIAGNOSIS — O0911 Supervision of pregnancy with history of ectopic or molar pregnancy, first trimester: Secondary | ICD-10-CM | POA: Insufficient documentation

## 2015-07-25 LAB — WET PREP, GENITAL
CLUE CELLS WET PREP: NONE SEEN
Sperm: NONE SEEN
TRICH WET PREP: NONE SEEN
Yeast Wet Prep HPF POC: NONE SEEN

## 2015-07-25 LAB — URINE MICROSCOPIC-ADD ON

## 2015-07-25 LAB — URINALYSIS, ROUTINE W REFLEX MICROSCOPIC
BILIRUBIN URINE: NEGATIVE
Glucose, UA: NEGATIVE mg/dL
KETONES UR: NEGATIVE mg/dL
LEUKOCYTES UA: NEGATIVE
NITRITE: NEGATIVE
PH: 6 (ref 5.0–8.0)
Protein, ur: NEGATIVE mg/dL
SPECIFIC GRAVITY, URINE: 1.01 (ref 1.005–1.030)

## 2015-07-25 LAB — CBC
HEMATOCRIT: 39.3 % (ref 36.0–46.0)
Hemoglobin: 13.4 g/dL (ref 12.0–15.0)
MCH: 30.7 pg (ref 26.0–34.0)
MCHC: 34.1 g/dL (ref 30.0–36.0)
MCV: 89.9 fL (ref 78.0–100.0)
PLATELETS: 201 10*3/uL (ref 150–400)
RBC: 4.37 MIL/uL (ref 3.87–5.11)
RDW: 13.2 % (ref 11.5–15.5)
WBC: 4.4 10*3/uL (ref 4.0–10.5)

## 2015-07-25 LAB — HCG, QUANTITATIVE, PREGNANCY: hCG, Beta Chain, Quant, S: 268 m[IU]/mL — ABNORMAL HIGH (ref <5)

## 2015-07-25 LAB — POCT PREGNANCY, URINE: PREG TEST UR: POSITIVE — AB

## 2015-07-25 MED ORDER — PRENATAL VITAMINS 28-0.8 MG PO TABS: 1.0000 | ORAL_TABLET | Freq: Every day | ORAL | Status: DC

## 2015-07-25 NOTE — MAU Note (Signed)
Hx of ectopic last year, MTX x2.  Recently found out she is pregnant, has had some cramping, wants to make sure everything is ok.

## 2015-07-25 NOTE — MAU Provider Note (Signed)
Chief Complaint: Possible Pregnancy and Abdominal Cramping   None     SUBJECTIVE HPI: Carrie [name] is a 24 y.o. G3P0020 at [redacted]w[redacted]d by LMP who presents to maternity admissions reporting cramping in early pregnancy, with hx of ectopic last year.  She was treated for her ectopic with MTX x 2 doses and she knows reports she knows we have to diagnose an ectopic early to treat it with medicine instead of surgery.  She reports the cramping is in the middle of her lower abdomen, and it is mild and intermittent. She has not tried anything for her pain.   She denies vaginal bleeding, vaginal itching/burning, urinary symptoms, h/a, dizziness, n/v, or fever/chills.     HPI  Past Medical History  Diagnosis Date  . Heart murmur     at birth  . Infection     UTI  . Anxiety     hx of meds   Past Surgical History  Procedure Laterality Date  . No past surgeries     Social History   Social History  . Marital Status: Single    Spouse Name: N/A  . Number of Children: N/A  . Years of Education: N/A   Occupational History  . Not on file.   Social History Main Topics  . Smoking status: Never Smoker   . Smokeless tobacco: Former Neurosurgeon  . Alcohol Use: No  . Drug Use: No  . Sexual Activity: Yes    Birth Control/ Protection: None   Other Topics Concern  . Not on file   Social History Narrative   No current facility-administered medications on file prior to encounter.   No current outpatient prescriptions on file prior to encounter.   Allergies  Allergen Reactions  . Augmentin [Amoxicillin-Pot Clavulanate] Nausea And Vomiting    Hot flashes   . Codeine Nausea Only    ROS:  Review of Systems  Constitutional: Negative for fever, chills and fatigue.  HENT: Negative for sinus pressure.   Eyes: Negative for photophobia.  Respiratory: Negative for shortness of breath.   Cardiovascular: Negative for chest pain.  Gastrointestinal: Negative for nausea, vomiting, diarrhea and constipation.   Genitourinary: Negative for dysuria, frequency, flank pain, vaginal bleeding, vaginal discharge, difficulty urinating, vaginal pain and pelvic pain.  Musculoskeletal: Negative for neck pain.  Neurological: Negative for dizziness, weakness and headaches.  Psychiatric/Behavioral: Negative.      I have reviewed patient's Past Medical Hx, Surgical Hx, Family Hx, Social Hx, medications and allergies.   Physical Exam   Patient Vitals for the past 24 hrs:  BP Temp Temp src Pulse Resp Height Weight  07/25/15 1005 106/69 mmHg 98.6 F (37 C) Oral 63 18 5' 4.5" (1.638 m) 147 lb 9.6 oz (66.951 kg)   Constitutional: Well-developed, well-nourished female in no acute distress.  Cardiovascular: normal rate Respiratory: normal effort GI: Abd soft, non-tender. Pos BS x 4 MS: Extremities nontender, no edema, normal ROM Neurologic: Alert and oriented x 4.  GU: Neg CVAT.  PELVIC EXAM: Cervix pink, visually closed, without lesion, scant white creamy discharge, vaginal walls and external genitalia normal Bimanual exam: Cervix 0/long/high, firm, anterior, neg CMT, uterus nontender, nonenlarged, adnexa without tenderness, enlargement, or mass   LAB RESULTS Results for orders placed or performed during the hospital encounter of 07/25/15 (from the past 24 hour(s))  Urinalysis, Routine w reflex microscopic (not at [facility])     Status: Abnormal   Collection Time: 07/25/15 10:10 AM  Result Value Ref Range   Color,  Urine YELLOW YELLOW   APPearance CLEAR CLEAR   Specific Gravity, Urine 1.010 1.005 - 1.030   pH 6.0 5.0 - 8.0   Glucose, UA NEGATIVE NEGATIVE mg/dL   Hgb urine dipstick TRACE (A) NEGATIVE   Bilirubin Urine NEGATIVE NEGATIVE   Ketones, ur NEGATIVE NEGATIVE mg/dL   Protein, ur NEGATIVE NEGATIVE mg/dL   Nitrite NEGATIVE NEGATIVE   Leukocytes, UA NEGATIVE NEGATIVE  Urine microscopic-add on     Status: Abnormal   Collection Time: 07/25/15 10:10 AM  Result Value Ref Range   Squamous Epithelial /  LPF 0-5 (A) NONE SEEN   WBC, UA 0-5 0 - 5 WBC/hpf   RBC / HPF 0-5 0 - 5 RBC/hpf   Bacteria, UA FEW (A) NONE SEEN  Pregnancy, urine POC     Status: Abnormal   Collection Time: 07/25/15 10:14 AM  Result Value Ref Range   Preg Test, Ur POSITIVE (A) NEGATIVE  hCG, quantitative, pregnancy     Status: Abnormal   Collection Time: 07/25/15 10:26 AM  Result Value Ref Range   hCG, Beta Chain, Quant, S 268 (H) <5 mIU/mL  CBC     Status: None   Collection Time: 07/25/15 10:27 AM  Result Value Ref Range   WBC 4.4 4.0 - 10.5 K/uL   RBC 4.37 3.87 - 5.11 MIL/uL   Hemoglobin 13.4 12.0 - 15.0 g/dL   HCT 01.0 93.2 - 35.5 %   MCV 89.9 78.0 - 100.0 fL   MCH 30.7 26.0 - 34.0 pg   MCHC 34.1 30.0 - 36.0 g/dL   RDW 73.2 20.2 - 54.2 %   Platelets 201 150 - 400 K/uL  Wet prep, genital     Status: Abnormal   Collection Time: 07/25/15 10:41 AM  Result Value Ref Range   Yeast Wet Prep HPF POC NONE SEEN NONE SEEN   Trich, Wet Prep NONE SEEN NONE SEEN   Clue Cells Wet Prep HPF POC NONE SEEN NONE SEEN   WBC, Wet Prep HPF POC FEW (A) NONE SEEN   Sperm NONE SEEN        IMAGING US Ob Comp Less 14 Wks  07/25/2015  CLINICAL DATA:  24 year old pregnant female presenting with pelvic cramping. Reported history of ectopic pregnancy. Quantitative beta HCG is pending. LMP 06/27/2015, EDC by LMP: 04/02/2016, projecting to an expected gestational age of [redacted] weeks 0 days. EXAM: OBSTETRIC <14 WK [place] AND TRANSVAGINAL OB US TECHNIQUE: Both transabdominal and transvaginal ultrasound examinations were performed for complete evaluation of the gestation as well as the maternal uterus, adnexal regions, and pelvic cul-de-sac. Transvaginal technique was performed to assess early pregnancy. COMPARISON:  No prior scans from this gestation. FINDINGS: The anteverted anteflexed uterus measures 7.7 x 3.9 x 4.4 cm in size. No intrauterine gestational sac is detected. The bilayer endometrial thickness is 14 mm. No focal endometrial lesion or  endometrial cavity fluid. No uterine fibroids or other myometrial abnormality. Cervix appears grossly normal. The left ovary measures 5.4 x 4.3 x 4.9 cm. There is a hemorrhagic cyst within the left ovary measuring 3.0 x 1.9 x 2.1 cm (demonstrating heterogeneous internal echoes and no internal vascularity). There are additional subjacent simple follicular cysts in the left ovary measuring 3.4 x 2.1 x 3.1 cm and 3.3 x 2.8 x 2.4 cm. The right ovary measures 3.8 x 2.6 x 2.2 cm. There are no suspicious ovarian or adnexal masses. No abnormal free fluid is seen in the pelvis. IMPRESSION: 1. Non localization of the pregnancy  at sonography. The sonographic differential diagnosis remains an intrauterine gestation too small to visualize, a spontaneous abortion or an occult ectopic gestation. Recommend correlation with serial serum beta HCG levels. Recommend a follow-up obstetric scan in 10-14 days or earlier as clinically warranted. 2. No suspicious ovarian or adnexal masses. Left ovary is enlarged by a 3.0 cm hemorrhagic cyst and separate subjacent 3.3 cm and 3.4 cm simple follicular cysts. 3. No abnormal free fluid in the pelvis. Electronically Signed   By: Delbert PhenixJason A Poff M.D.   On: 07/25/2015 11:18   [place] Ob Transvaginal  07/25/2015  CLINICAL DATA:  24 year old pregnant female presenting with pelvic cramping. Reported history of ectopic pregnancy. Quantitative beta HCG is pending. LMP 06/27/2015, EDC by LMP: 04/02/2016, projecting to an expected gestational age of [redacted] weeks 0 days. EXAM: OBSTETRIC <14 WK US AND TRANSVAGINAL OB US TECHNIQUE: Both transabdominal and transvaginal ultrasound examinations were performed for complete evaluation of the gestation as well as the maternal uterus, adnexal regions, and pelvic cul-de-sac. Transvaginal technique was performed to assess early pregnancy. COMPARISON:  No prior scans from this gestation. FINDINGS: The anteverted anteflexed uterus measures 7.7 x 3.9 x 4.4 cm in size. No  intrauterine gestational sac is detected. The bilayer endometrial thickness is 14 mm. No focal endometrial lesion or endometrial cavity fluid. No uterine fibroids or other myometrial abnormality. Cervix appears grossly normal. The left ovary measures 5.4 x 4.3 x 4.9 cm. There is a hemorrhagic cyst within the left ovary measuring 3.0 x 1.9 x 2.1 cm (demonstrating heterogeneous internal echoes and no internal vascularity). There are additional subjacent simple follicular cysts in the left ovary measuring 3.4 x 2.1 x 3.1 cm and 3.3 x 2.8 x 2.4 cm. The right ovary measures 3.8 x 2.6 x 2.2 cm. There are no suspicious ovarian or adnexal masses. No abnormal free fluid is seen in the pelvis. IMPRESSION: 1. Non localization of the pregnancy at sonography. The sonographic differential diagnosis remains an intrauterine gestation too small to visualize, a spontaneous abortion or an occult ectopic gestation. Recommend correlation with serial serum beta HCG levels. Recommend a follow-up obstetric scan in 10-14 days or earlier as clinically warranted. 2. No suspicious ovarian or adnexal masses. Left ovary is enlarged by a 3.0 cm hemorrhagic cyst and separate subjacent 3.3 cm and 3.4 cm simple follicular cysts. 3. No abnormal free fluid in the pelvis. Electronically Signed   By: Delbert PhenixJason A Poff M.D.   On: 07/25/2015 11:18    MAU Management/MDM: Ordered labs and reviewed results.  Findings today could represent a normal early pregnancy, spontaneous abortion or ectopic pregnancy which can be life-threatening.  Ectopic precautions were given to the patient with plan to return in 48 hours for repeat quant hcg to evaluate pregnancy development.  Pt stable at time of discharge.  ASSESSMENT 1. Pregnancy of unknown anatomic location   2. Pelvic pain affecting pregnancy in first trimester, antepartum     PLAN Discharge home with ectopic precautions   Follow-up Information    [facility].   Why:  In 48 hours for repeat labs or sooner as needed   Contact information:   [address] [id number] [place] [id number] [phone]      Sharen CounterLisa Leftwich-Kirby Certified Nurse-Midwife 07/25/2015  11:34 AM

## 2015-07-25 NOTE — Discharge Instructions (Signed)

## 2015-07-26 LAB — GC/CHLAMYDIA PROBE AMP (~~LOC~~) NOT AT ARMC
CHLAMYDIA, DNA PROBE: NEGATIVE
NEISSERIA GONORRHEA: NEGATIVE

## 2015-07-26 LAB — HIV ANTIBODY (ROUTINE TESTING W REFLEX): HIV SCREEN 4TH GENERATION: NONREACTIVE

## 2015-07-27 ENCOUNTER — Inpatient Hospital Stay (HOSPITAL_COMMUNITY)
Admission: AD | Admit: 2015-07-27 | Discharge: 2015-07-27 | Disposition: A | Discharge status: Home / Self Care | Payer: Managed Care, Other (non HMO) | Source: Ambulatory Visit | Attending: Obstetrics & Gynecology | Admitting: Obstetrics & Gynecology

## 2015-07-27 DIAGNOSIS — Z3A01 Less than 8 weeks gestation of pregnancy: Secondary | ICD-10-CM | POA: Diagnosis not present

## 2015-07-27 DIAGNOSIS — O9989 Other specified diseases and conditions complicating pregnancy, childbirth and the puerperium: Secondary | ICD-10-CM

## 2015-07-27 DIAGNOSIS — R109 Unspecified abdominal pain: Secondary | ICD-10-CM

## 2015-07-27 DIAGNOSIS — O26891 Other specified pregnancy related conditions, first trimester: Secondary | ICD-10-CM | POA: Insufficient documentation

## 2015-07-27 DIAGNOSIS — O26899 Other specified pregnancy related conditions, unspecified trimester: Secondary | ICD-10-CM

## 2015-07-27 LAB — HCG, QUANTITATIVE, PREGNANCY: hCG, Beta Chain, Quant, S: 601 m[IU]/mL — ABNORMAL HIGH (ref <5)

## 2015-07-27 NOTE — MAU Note (Signed)
Doing ok.  No pain or bleeding

## 2015-07-27 NOTE — Discharge Instructions (Signed)
First Trimester of Pregnancy  The first trimester of pregnancy is from week 1 until the end of week 12 (months 1 through 3). A week after a sperm fertilizes an egg, the egg will implant on the wall of the uterus. This embryo will begin to develop into a baby. Genes from you and your partner are forming the baby. The female genes determine whether the baby is a boy or a girl. At 6-8 weeks, the eyes and face are formed, and the heartbeat can be seen on ultrasound. At the end of 12 weeks, all the baby's organs are formed.   Now that you are pregnant, you will want to do everything you can to have a healthy baby. Two of the most important things are to get good prenatal care and to follow your health care provider's instructions. Prenatal care is all the medical care you receive before the baby's birth. This care will help prevent, find, and treat any problems during the pregnancy and childbirth.  BODY CHANGES  Your body goes through many changes during pregnancy. The changes vary from woman to woman.   · You may gain or lose a couple of pounds at first.  · You may feel sick to your stomach (nauseous) and throw up (vomit). If the vomiting is uncontrollable, call your health care provider.  · You may tire easily.  · You may develop headaches that can be relieved by medicines approved by your health care provider.  · You may urinate more often. Painful urination may mean you have a bladder infection.  · You may develop heartburn as a result of your pregnancy.  · You may develop constipation because certain hormones are causing the muscles that push waste through your intestines to slow down.  · You may develop hemorrhoids or swollen, bulging veins (varicose veins).  · Your breasts may begin to grow larger and become tender. Your nipples may stick out more, and the tissue that surrounds them (areola) may become darker.  · Your gums may bleed and may be sensitive to brushing and flossing.   · Dark spots or blotches (chloasma, mask of pregnancy) may develop on your face. This will likely fade after the baby is born.  · Your menstrual periods will stop.  · You may have a loss of appetite.  · You may develop cravings for certain kinds of food.  · You may have changes in your emotions from day to day, such as being excited to be pregnant or being concerned that something may go wrong with the pregnancy and baby.  · You may have more vivid and strange dreams.  · You may have changes in your hair. These can include thickening of your hair, rapid growth, and changes in texture. Some women also have hair loss during or after pregnancy, or hair that feels dry or thin. Your hair will most likely return to normal after your baby is born.  WHAT TO EXPECT AT YOUR PRENATAL VISITS  During a routine prenatal visit:  · You will be weighed to make sure you and the baby are growing normally.  · Your blood pressure will be taken.  · Your abdomen will be measured to track your baby's growth.  · The fetal heartbeat will be listened to starting around week 10 or 12 of your pregnancy.  · Test results from any previous visits will be discussed.  Your health care provider may ask you:  · How you are feeling.  · If you   including cigarettes, chewing tobacco, and electronic cigarettes. °· If you have any questions. °Other tests that may be performed during your first trimester include: °· Blood tests to find your blood type and to check for the presence of any previous infections. They will also be used to check for low iron levels (anemia) and Rh antibodies. Later in the pregnancy, blood tests for diabetes will be done along with other tests if problems develop. °· Urine tests to check for infections, diabetes, or protein in the urine. °· An ultrasound to confirm the proper growth  and development of the baby. °· An amniocentesis to check for possible genetic problems. °· Fetal screens for spina bifida and Down syndrome. °· You may need other tests to make sure you and the baby are doing well. °· HIV (human immunodeficiency virus) testing. Routine prenatal testing includes screening for HIV, unless you choose not to have this test. °HOME CARE INSTRUCTIONS  °Medicines °· Follow your health care provider's instructions regarding medicine use. Specific medicines may be either safe or unsafe to take during pregnancy. °· Take your prenatal vitamins as directed. °· If you develop constipation, try taking a stool softener if your health care provider approves. °Diet °· Eat regular, well-balanced meals. Choose a variety of foods, such as meat or vegetable-based protein, fish, milk and low-fat dairy products, vegetables, fruits, and whole grain breads and cereals. Your health care provider will help you determine the amount of weight gain that is right for you. °· Avoid raw meat and uncooked cheese. These carry germs that can cause birth defects in the baby. °· Eating four or five small meals rather than three large meals a day may help relieve nausea and vomiting. If you start to feel nauseous, eating a few soda crackers can be helpful. Drinking liquids between meals instead of during meals also seems to help nausea and vomiting. °· If you develop constipation, eat more high-fiber foods, such as fresh vegetables or fruit and whole grains. Drink enough fluids to keep your urine clear or pale yellow. °Activity and Exercise °· Exercise only as directed by your health care provider. Exercising will help you: °¨ Control your weight. °¨ Stay in shape. °¨ Be prepared for labor and delivery. °· Experiencing pain or cramping in the lower abdomen or low back is a good sign that you should stop exercising. Check with your health care provider before continuing normal exercises. °· Try to avoid standing for long  periods of time. Move your legs often if you must stand in one place for a long time. °· Avoid heavy lifting. °· Wear low-heeled shoes, and practice good posture. °· You may continue to have sex unless your health care provider directs you otherwise. °Relief of Pain or Discomfort °· Wear a good support bra for breast tenderness.   °· Take warm sitz baths to soothe any pain or discomfort caused by hemorrhoids. Use hemorrhoid cream if your health care provider approves.   °· Rest with your legs elevated if you have leg cramps or low back pain. °· If you develop varicose veins in your legs, wear support hose. Elevate your feet for 15 minutes, 3-4 times a day. Limit salt in your diet. °Prenatal Care °· Schedule your prenatal visits by the twelfth week of pregnancy. They are usually scheduled monthly at first, then more often in the last 2 months before delivery. °· Write down your questions. Take them to your prenatal visits. °· Keep all your prenatal visits as directed by your   health care provider. °Safety °· Wear your seat belt at all times when driving. °· Make a list of emergency phone numbers, including numbers for family, friends, the hospital, and police and fire departments. °General Tips °· Ask your health care provider for a referral to a local prenatal education class. Begin classes no later than at the beginning of month 6 of your pregnancy. °· Ask for help if you have counseling or nutritional needs during pregnancy. Your health care provider can offer advice or refer you to specialists for help with various needs. °· Do not use hot tubs, steam rooms, or saunas. °· Do not douche or use tampons or scented sanitary pads. °· Do not cross your legs for long periods of time. °· Avoid cat litter boxes and soil used by cats. These carry germs that can cause birth defects in the baby and possibly loss of the fetus by miscarriage or stillbirth. °· Avoid all smoking, herbs, alcohol, and medicines not prescribed by  your health care provider. Chemicals in these affect the formation and growth of the baby. °· Do not use any tobacco products, including cigarettes, chewing tobacco, and electronic cigarettes. If you need help quitting, ask your health care provider. You may receive counseling support and other resources to help you quit. °· Schedule a dentist appointment. At home, brush your teeth with a soft toothbrush and be gentle when you floss. °SEEK MEDICAL CARE IF:  °· You have dizziness. °· You have mild pelvic cramps, pelvic pressure, or nagging pain in the abdominal area. °· You have persistent nausea, vomiting, or diarrhea. °· You have a bad smelling vaginal discharge. °· You have pain with urination. °· You notice increased swelling in your face, hands, legs, or ankles. °SEEK IMMEDIATE MEDICAL CARE IF:  °· You have a fever. °· You are leaking fluid from your vagina. °· You have spotting or bleeding from your vagina. °· You have severe abdominal cramping or pain. °· You have rapid weight gain or loss. °· You vomit blood or material that looks like coffee grounds. °· You are exposed to German measles and have never had them. °· You are exposed to fifth disease or chickenpox. °· You develop a severe headache. °· You have shortness of breath. °· You have any kind of trauma, such as from a fall or a car accident. °  °This information is not intended to replace advice given to you by your health care provider. Make sure you discuss any questions you have with your health care provider. °  °Document Released: 08/05/2001 Document Revised: 09/01/2014 Document Reviewed: 06/21/2013 °Elsevier Interactive Patient Education ©2016 [employer] ° ° ° °Safe Medications in Pregnancy  ° °Acne: °Benzoyl Peroxide °Salicylic Acid ° °Backache/Headache: °Tylenol: 2 regular strength every 4 hours OR °             2 Extra strength every 6 hours ° °Colds/Coughs/Allergies: °Benadryl (alcohol free) 25 mg every 6 hours as needed °Breath right  strips °Claritin °Cepacol throat lozenges °Chloraseptic throat spray °Cold-Eeze- up to three times per day °Cough drops, alcohol free °Flonase (by prescription only) °Guaifenesin °Mucinex °Robitussin DM (plain only, alcohol free) °Saline nasal spray/drops °Sudafed (pseudoephedrine) & Actifed ** use only after [redacted] weeks gestation and if you do not have high blood pressure °Tylenol °Vicks Vaporub °Zinc lozenges °Zyrtec  ° °Constipation: °Colace °Ducolax suppositories °Fleet enema °Glycerin suppositories °Metamucil °Milk of magnesia °Miralax °Senokot °Smooth move tea ° °Diarrhea: °Kaopectate °Imodium A-D ° °*NO pepto Bismol ° °Hemorrhoids: °  Anusol °Anusol HC °Preparation H °Tucks ° °Indigestion: °Tums °Maalox °Mylanta °Zantac  °Pepcid ° °Insomnia: °Benadryl (alcohol free) 25mg every 6 hours as needed °Tylenol PM °Unisom, no Gelcaps ° °Leg Cramps: °Tums °MagGel ° °Nausea/Vomiting:  °Bonine °Dramamine °Emetrol °Ginger extract °Sea bands °Meclizine  °Nausea medication to take during pregnancy:  °Unisom (doxylamine succinate 25 mg tablets) Take one tablet daily at bedtime. If symptoms are not adequately controlled, the dose can be increased to a maximum recommended dose of two tablets daily (1/2 tablet in the morning, 1/2 tablet mid-afternoon and one at bedtime). °Vitamin B6 100mg tablets. Take one tablet twice a day (up to 200 mg per day). ° °Skin Rashes: °Aveeno products °Benadryl cream or 25mg every 6 hours as needed °Calamine Lotion °1% cortisone cream ° °Yeast infection: °Gyne-lotrimin 7 °Monistat 7 ° ° °**If taking multiple medications, please check labels to avoid duplicating the same active ingredients °**take medication as directed on the label °** Do not exceed 4000 mg of tylenol in 24 hours °**Do not take medications that contain aspirin or ibuprofen ° ° ° ° ° °[facility]  ° °Bernard Marshall      Phone: [phone] ° °[facility]     Phone: [phone] ° °[facility]  Phone: [phone] ° °[facility] [place]  Phone: [phone] ° °[facility]    Phone: [phone]  ° °[facility] ([place])    Phone: [phone] ° °[facility]    Phone: [phone] ° °[facility]    Phone: [phone] ° °[facility]    Phone: [phone] ° °[facility] Phone: [phone]  ° °[facility]  Phone: [phone] ° °[facility]    Phone: [phone] ° °[facility]   Phone: [phone] ° °[facility]      Phone: [phone] ° °[facility]    Phone: [phone] ° °[facility]     Phone: [phone] ° °

## 2015-07-27 NOTE — [facility] Provider Note (Signed)
History   [id number]   Chief Complaint  Patient presents with  . Follow-up    HPI Carrie [name] is a 24 y.o. female G3P0020 here for follow-up BHCG.  Upon review of the records patient was first seen on 11/30 for abdominal pain. BHCG on that day was 268.  Ultrasound showed no IUP & 3 cm left hemorrhagic cyst. GC/CT and wet prep were collected.  Results were negative. Pt discharged home. Pt here today with no report of abdominal pain or vaginal bleeding.   All other systems negative.     Patient's last menstrual period was 06/27/2015.  OB History  Gravida Para Term Preterm AB SAB TAB Ectopic Multiple Living  3 0 0 0 2 0 1 1      # Outcome Date GA Lbr Len/2nd Weight Sex Delivery Anes PTL Lv  3 Current           2 Ectopic              Comments: System Generated. Please review and update pregnancy details.  1 TAB              Comments: "underage, took abortion pill, no complications"      Past Medical History  Diagnosis Date  . Heart murmur     at birth  . Infection     UTI  . Anxiety     hx of meds    Family History  Problem Relation Age of Onset  . Diabetes Mother   . Hypertension Father   . Hyperlipidemia Father   . Diabetes Maternal Grandfather   . Cancer Paternal Grandmother   . Cancer Paternal Grandfather   . Stroke Paternal Grandfather   . Heart disease Paternal Grandfather   . Hearing loss Neg Hx     Social History   Social History  . Marital Status: Single    Spouse Name: N/A  . Number of Children: N/A  . Years of Education: N/A   Social History Main Topics  . Smoking status: Never Smoker   . Smokeless tobacco: Former NeurosurgeonUser  . Alcohol Use: No  . Drug Use: No  . Sexual Activity: Yes    Birth Control/ Protection: None   Other Topics Concern  . Not on file   Social History Narrative    Allergies  Allergen Reactions  . Augmentin [Amoxicillin-Pot Clavulanate] Nausea And Vomiting    Hot flashes   . Codeine Nausea Only    No current  facility-administered medications on file prior to encounter.   Current Outpatient Prescriptions on File Prior to Encounter  Medication Sig Dispense Refill  . Prenatal Vit-Fe Fumarate-FA (PRENATAL VITAMINS) 28-0.8 MG TABS Take 1 tablet by mouth daily. 30 tablet 5     Physical Exam   Filed Vitals:   07/27/15 1053  BP: 108/64  Pulse: 80  Temp: 98.9 F (37.2 C)  TempSrc: Oral  Resp: 16    Physical Exam  Constitutional: She appears well-developed and well-nourished. No distress.  HENT:  Head: Normocephalic and atraumatic.  Cardiovascular: Normal rate.   Respiratory: Effort normal. No respiratory distress.  Skin: She is not diaphoretic.  Psychiatric: She has a normal mood and affect. Her behavior is normal. Judgment and thought content normal.    MAU Course  Procedures Component     Latest Ref Rng 07/25/2015 07/27/2015  HCG, Beta Chain, Quant, S     <5 mIU/mL 268 (H) 601 (H)   MDM Appropriate rise in Bhcg Patient denies  abdominal pain or vaginal bleeding.  Will schedule f/u outpatient ultrasound  Assessment and Plan  24 y.o. G3P0020 at [redacted]w[redacted]d wks Pregnancy Follow-up BHCG 1. Abdominal pain affecting pregnancy     P: Discharge home Outpatient ultrasound ordered - pt to f/u for results in cliniic Discussed reasons to return to [facility] Pregnancy verification letter & list of providers given   Carrie Horn, NP 07/27/2015 10:06 AM

## 2015-08-06 ENCOUNTER — Ambulatory Visit (INDEPENDENT_AMBULATORY_CARE_PROVIDER_SITE_OTHER): Payer: Managed Care, Other (non HMO) | Admitting: Family Medicine

## 2015-08-06 ENCOUNTER — Ambulatory Visit (HOSPITAL_COMMUNITY)
Admission: RE | Admit: 2015-08-06 | Discharge: 2015-08-06 | Disposition: A | Discharge status: Home / Self Care | Payer: Managed Care, Other (non HMO) | Source: Ambulatory Visit | Attending: Student | Admitting: Student

## 2015-08-06 ENCOUNTER — Encounter: Payer: Self-pay | Admitting: Family Medicine

## 2015-08-06 ENCOUNTER — Ambulatory Visit (HOSPITAL_COMMUNITY): Payer: Managed Care, Other (non HMO)

## 2015-08-06 DIAGNOSIS — Z36 Encounter for antenatal screening of mother: Secondary | ICD-10-CM | POA: Insufficient documentation

## 2015-08-06 DIAGNOSIS — Z3A01 Less than 8 weeks gestation of pregnancy: Secondary | ICD-10-CM | POA: Diagnosis not present

## 2015-08-06 DIAGNOSIS — R109 Unspecified abdominal pain: Secondary | ICD-10-CM

## 2015-08-06 DIAGNOSIS — O26899 Other specified pregnancy related conditions, unspecified trimester: Secondary | ICD-10-CM

## 2015-08-06 DIAGNOSIS — Z3481 Encounter for supervision of other normal pregnancy, first trimester: Secondary | ICD-10-CM

## 2015-08-06 DIAGNOSIS — Z3490 Encounter for supervision of normal pregnancy, unspecified, unspecified trimester: Secondary | ICD-10-CM | POA: Insufficient documentation

## 2015-08-06 NOTE — Patient Instructions (Addendum)
Return to care   If you have heavier bleeding that soaks through more that 2 pads per hour for an hour of more  If you bleed so much that you feel like your might pass out  If you have significant abdominal pain that is not improved with Tylenol   If you develop a fever > 100.5  First Trimester of Pregnancy The first trimester of pregnancy is from week 1 until the end of week 12 (months 1 through 3). A week after a sperm fertilizes an egg, the egg will implant on the wall of the uterus. This embryo will begin to develop into a baby. Genes from you and your partner are forming the baby. The female genes determine whether the baby is a boy or a girl. At 6-8 weeks, the eyes and face are formed, and the heartbeat can be seen on ultrasound. At the end of 12 weeks, all the baby's organs are formed.  Now that you are pregnant, you will want to do everything you can to have a healthy baby. Two of the most important things are to get good prenatal care and to follow your health care provider's instructions. Prenatal care is all the medical care you receive before the baby's birth. This care will help prevent, find, and treat any problems during the pregnancy and childbirth. BODY CHANGES Your body goes through many changes during pregnancy. The changes vary from woman to woman.   You may gain or lose a couple of pounds at first.  You may feel sick to your stomach (nauseous) and throw up (vomit). If the vomiting is uncontrollable, call your health care provider.  You may tire easily.  You may develop headaches that can be relieved by medicines approved by your health care provider.  You may urinate more often. Painful urination may mean you have a bladder infection.  You may develop heartburn as a result of your pregnancy.  You may develop constipation because certain hormones are causing the muscles that push waste through your intestines to slow down.  You may develop hemorrhoids or swollen,  bulging veins (varicose veins).  Your breasts may begin to grow larger and become tender. Your nipples may stick out more, and the tissue that surrounds them (areola) may become darker.  Your gums may bleed and may be sensitive to brushing and flossing.  Dark spots or blotches (chloasma, mask of pregnancy) may develop on your face. This will likely fade after the baby is born.  Your menstrual periods will stop.  You may have a loss of appetite.  You may develop cravings for certain kinds of food.  You may have changes in your emotions from day to day, such as being excited to be pregnant or being concerned that something may go wrong with the pregnancy and baby.  You may have more vivid and strange dreams.  You may have changes in your hair. These can include thickening of your hair, rapid growth, and changes in texture. Some women also have hair loss during or after pregnancy, or hair that feels dry or thin. Your hair will most likely return to normal after your baby is born. WHAT TO EXPECT AT YOUR PRENATAL VISITS During a routine prenatal visit:  You will be weighed to make sure you and the baby are growing normally.  Your blood pressure will be taken.  Your abdomen will be measured to track your baby's growth.  The fetal heartbeat will be listened to starting around week 10  or 12 of your pregnancy.  Test results from any previous visits will be discussed. Your health care provider may ask you:  How you are feeling.  If you are feeling the baby move.  If you have had any abnormal symptoms, such as leaking fluid, bleeding, severe headaches, or abdominal cramping.  If you are using any tobacco products, including cigarettes, chewing tobacco, and electronic cigarettes.  If you have any questions. Other tests that may be performed during your first trimester include:  Blood tests to find your blood type and to check for the presence of any previous infections. They will also  be used to check for low iron levels (anemia) and Rh antibodies. Later in the pregnancy, blood tests for diabetes will be done along with other tests if problems develop.  Urine tests to check for infections, diabetes, or protein in the urine.  An ultrasound to confirm the proper growth and development of the baby.  An amniocentesis to check for possible genetic problems.  Fetal screens for spina bifida and Down syndrome.  You may need other tests to make sure you and the baby are doing well.  HIV (human immunodeficiency virus) testing. Routine prenatal testing includes screening for HIV, unless you choose not to have this test. HOME CARE INSTRUCTIONS  Medicines  Follow your health care provider's instructions regarding medicine use. Specific medicines may be either safe or unsafe to take during pregnancy.  Take your prenatal vitamins as directed.  If you develop constipation, try taking a stool softener if your health care provider approves. Diet  Eat regular, well-balanced meals. Choose a variety of foods, such as meat or vegetable-based protein, fish, milk and low-fat dairy products, vegetables, fruits, and whole grain breads and cereals. Your health care provider will help you determine the amount of weight gain that is right for you.  Avoid raw meat and uncooked cheese. These carry germs that can cause birth defects in the baby.  Eating four or five small meals rather than three large meals a day may help relieve nausea and vomiting. If you start to feel nauseous, eating a few soda crackers can be helpful. Drinking liquids between meals instead of during meals also seems to help nausea and vomiting.  If you develop constipation, eat more high-fiber foods, such as fresh vegetables or fruit and whole grains. Drink enough fluids to keep your urine clear or pale yellow. Activity and Exercise  Exercise only as directed by your health care provider. Exercising will help you:  Control  your weight.  Stay in shape.  Be prepared for labor and delivery.  Experiencing pain or cramping in the lower abdomen or low back is a good sign that you should stop exercising. Check with your health care provider before continuing normal exercises.  Try to avoid standing for long periods of time. Move your legs often if you must stand in one place for a long time.  Avoid heavy lifting.  Wear low-heeled shoes, and practice good posture.  You may continue to have sex unless your health care provider directs you otherwise. Relief of Pain or Discomfort  Wear a good support bra for breast tenderness.   Take warm sitz baths to soothe any pain or discomfort caused by hemorrhoids. Use hemorrhoid cream if your health care provider approves.   Rest with your legs elevated if you have leg cramps or low back pain.  If you develop varicose veins in your legs, wear support hose. Elevate your feet for 15  minutes, 3-4 times a day. Limit salt in your diet. Prenatal Care  Schedule your prenatal visits by the twelfth week of pregnancy. They are usually scheduled monthly at first, then more often in the last 2 months before delivery.  Write down your questions. Take them to your prenatal visits.  Keep all your prenatal visits as directed by your health care provider. Safety  Wear your seat belt at all times when driving.  Make a list of emergency phone numbers, including numbers for family, friends, the hospital, and police and fire departments. General Tips  Ask your health care provider for a referral to a local prenatal education class. Begin classes no later than at the beginning of month 6 of your pregnancy.  Ask for help if you have counseling or nutritional needs during pregnancy. Your health care provider can offer advice or refer you to specialists for help with various needs.  Do not use hot tubs, steam rooms, or saunas.  Do not douche or use tampons or scented sanitary  pads.  Do not cross your legs for long periods of time.  Avoid cat litter boxes and soil used by cats. These carry germs that can cause birth defects in the baby and possibly loss of the fetus by miscarriage or stillbirth.  Avoid all smoking, herbs, alcohol, and medicines not prescribed by your health care provider. Chemicals in these affect the formation and growth of the baby.  Do not use any tobacco products, including cigarettes, chewing tobacco, and electronic cigarettes. If you need help quitting, ask your health care provider. You may receive counseling support and other resources to help you quit.  Schedule a dentist appointment. At home, brush your teeth with a soft toothbrush and be gentle when you floss. SEEK MEDICAL CARE IF:   You have dizziness.  You have mild pelvic cramps, pelvic pressure, or nagging pain in the abdominal area.  You have persistent nausea, vomiting, or diarrhea.  You have a bad smelling vaginal discharge.  You have pain with urination.  You notice increased swelling in your face, hands, legs, or ankles. SEEK IMMEDIATE MEDICAL CARE IF:   You have a fever.  You are leaking fluid from your vagina.  You have spotting or bleeding from your vagina.  You have severe abdominal cramping or pain.  You have rapid weight gain or loss.  You vomit blood or material that looks like coffee grounds.  You are exposed to [place] measles and have never had them.  You are exposed to fifth disease or chickenpox.  You develop a severe headache.  You have shortness of breath.  You have any kind of trauma, such as from a fall or a car accident.   This information is not intended to replace advice given to you by your health care provider. Make sure you discuss any questions you have with your health care provider.   Document Released: 08/05/2001 Document Revised: 09/01/2014 Document Reviewed: 06/21/2013 Elsevier Interactive Patient Education 2016 [employer]  Subchorionic Hematoma A subchorionic hematoma is a gathering of blood between the outer wall of the placenta and the inner wall of the womb (uterus). The placenta is the organ that connects the fetus to the wall of the uterus. The placenta performs the feeding, breathing (oxygen to the fetus), and waste removal (excretory work) of the fetus.  Subchorionic hematoma is the most common abnormality found on a result from ultrasonography done during the first trimester or early second trimester of pregnancy. If there  has been little or no vaginal bleeding, early small hematomas usually shrink on their own and do not affect your baby or pregnancy. The blood is gradually absorbed over 1-2 weeks. When bleeding starts later in pregnancy or the hematoma is larger or occurs in an older pregnant woman, the outcome may not be as good. Larger hematomas may get bigger, which increases the chances for miscarriage. Subchorionic hematoma also increases the risk of premature detachment of the placenta from the uterus, preterm (premature) labor, and stillbirth. HOME CARE INSTRUCTIONS  Stay on bed rest if your health care provider recommends this. Although bed rest will not prevent more bleeding or prevent a miscarriage, your health care provider may recommend bed rest until you are advised otherwise.  Avoid heavy lifting (more than 10 lb [4.5 kg]), exercise, sexual intercourse, or douching as directed by your health care provider.  Keep track of the number of pads you use each day and how soaked (saturated) they are. Write down this information.  Do not use tampons.  Keep all follow-up appointments as directed by your health care provider. Your health care provider may ask you to have follow-up blood tests or ultrasound tests or both. SEEK IMMEDIATE MEDICAL CARE IF:  You have severe cramps in your stomach, back, abdomen, or pelvis.  You have a fever.  You pass large clots or tissue. Save any tissue for your  health care provider to look at.  Your bleeding increases or you become lightheaded, feel weak, or have fainting episodes.   This information is not intended to replace advice given to you by your health care provider. Make sure you discuss any questions you have with your health care provider.   Document Released: 11/26/2006 Document Revised: 09/01/2014 Document Reviewed: 03/10/2013 Elsevier Interactive Patient Education [employer]2016 Else[employer] Inc.

## 2015-08-06 NOTE — Progress Notes (Signed)
Patient ID: Carrie ShortsKayla M Cart, female   DOB: [birth year], 24 y.o.   MRN: [id number] Subjective:  Carrie [name] is a 24 y.o. G3P0020 at 2349w5d being seen today for ED follow up.  She is currently monitored for the following issues for this low-risk pregnancy and has Early stage of pregnancy on her problem list.  Since been seen reports no abdominal pain.   The following portions of the patient's history were reviewed and updated as appropriate: allergies, current medications, past family history, past medical history, past social history, past surgical history and problem list. Problem list updated.  Objective:   General:  Alert, oriented and cooperative. Patient is in no acute distress.  Skin: Skin is warm and dry. No rash noted.   Cardiovascular: Normal heart rate noted  Respiratory: Normal respiratory effort, no problems with respiration noted  Abdomen: Soft, gravid, appropriate for gestational age.       Extremities: Normal range of motion.     Mental Status: Normal mood and affect. Normal behavior. Normal judgment and thought content.   TVUS: 08/06/2015 IMPRESSION: Early intrauterine pregnancy, 6 weeks 2 days by mean sac diameter. Fetal heart rate 89 beats per minute. Small subchorionic hemorrhage.  Assessment and Plan:  Pregnancy: G3P0020 at 3849w5d  1. Early stage of pregnancy - Recommended PNV - Patient will need to establish pregnancy care - Discussed miscarriage precautions   2. Subchorionic hematoma  Miscarriage precautions and  general obstetric precautions including but not limited to vaginal bleeding, contractions, leaking of fluid and fetal movement were reviewed in detail with the patient. Please refer to After Visit Summary for other counseling recommendations.  Return in about 4 weeks (around 09/03/2015) for Establish prenatal care.   Trivia Heffelfinger S

## 2015-08-26 NOTE — L&D Delivery Note (Signed)
Delivery Note At 12:18 AM a viable female was delivered via Vaginal, Spontaneous Delivery (Presentation: ROP;).  APGAR: 9, 9; weight Pending   Placenta status: intact, shultz, .3V cord:     Anesthesia:  Epidural Episiotomy: None Lacerations: 2nd degree;Vaginal Suture Repair: 3.0 vicryl Est. Blood Loss (mL):  150  Mom to postpartum.  Baby to Couplet care / Skin to Skin.  [name] 04/10/2016, 12:46 AM  Patient is a G3P0020 at 1554w1d who was admitted for post term IOL, uncomplicated prenatal course.  She progressed with augmentation via foley, cytotec, and AROM.  I was gloved and present for delivery in its entirety.  Second stage of labor progressed to SVD.  Mild decels during second stage noted.  Complications: none  Lacerations: 2nd deg vag  EBL: 150cc  Cam HaiSHAW, KIMBERLY, CNM 3:53 AM  04/10/2016

## 2015-09-04 ENCOUNTER — Encounter: Payer: Self-pay | Admitting: Advanced Practice Midwife

## 2015-09-04 ENCOUNTER — Encounter: Payer: Self-pay | Admitting: Student

## 2015-09-04 ENCOUNTER — Ambulatory Visit (INDEPENDENT_AMBULATORY_CARE_PROVIDER_SITE_OTHER): Payer: Managed Care, Other (non HMO) | Admitting: Student

## 2015-09-04 VITALS — BP 118/70 | HR 77 | Temp 98.3°F | Wt 146.3 lb

## 2015-09-04 DIAGNOSIS — Z3490 Encounter for supervision of normal pregnancy, unspecified, unspecified trimester: Secondary | ICD-10-CM

## 2015-09-04 DIAGNOSIS — Z3400 Encounter for supervision of normal first pregnancy, unspecified trimester: Secondary | ICD-10-CM | POA: Insufficient documentation

## 2015-09-04 DIAGNOSIS — Z3481 Encounter for supervision of other normal pregnancy, first trimester: Secondary | ICD-10-CM

## 2015-09-04 LAB — POCT URINALYSIS DIP (DEVICE)
BILIRUBIN URINE: NEGATIVE
Glucose, UA: NEGATIVE mg/dL
HGB URINE DIPSTICK: NEGATIVE
Ketones, ur: NEGATIVE mg/dL
LEUKOCYTES UA: NEGATIVE
Nitrite: NEGATIVE
Protein, ur: NEGATIVE mg/dL
SPECIFIC GRAVITY, URINE: 1.015 (ref 1.005–1.030)
UROBILINOGEN UA: 0.2 mg/dL (ref 0.0–1.0)
pH: 7.5 (ref 5.0–8.0)

## 2015-09-04 NOTE — Patient Instructions (Addendum)
First Trimester of Pregnancy The first trimester of pregnancy is from week 1 until the end of week 12 (months 1 through 3). A week after a sperm fertilizes an egg, the egg will implant on the wall of the uterus. This embryo will begin to develop into a baby. Genes from you and your partner are forming the baby. The female genes determine whether the baby is a boy or a girl. At 6-8 weeks, the eyes and face are formed, and the heartbeat can be seen on ultrasound. At the end of 12 weeks, all the baby's organs are formed.  Now that you are pregnant, you will want to do everything you can to have a healthy baby. Two of the most important things are to get good prenatal care and to follow your health care provider's instructions. Prenatal care is all the medical care you receive before the baby's birth. This care will help prevent, find, and treat any problems during the pregnancy and childbirth. BODY CHANGES Your body goes through many changes during pregnancy. The changes vary from woman to woman.   You may gain or lose a couple of pounds at first.  You may feel sick to your stomach (nauseous) and throw up (vomit). If the vomiting is uncontrollable, call your health care provider.  You may tire easily.  You may develop headaches that can be relieved by medicines approved by your health care provider.  You may urinate more often. Painful urination may mean you have a bladder infection.  You may develop heartburn as a result of your pregnancy.  You may develop constipation because certain hormones are causing the muscles that push waste through your intestines to slow down.  You may develop hemorrhoids or swollen, bulging veins (varicose veins).  Your breasts may begin to grow larger and become tender. Your nipples may stick out more, and the tissue that surrounds them (areola) may become darker.  Your gums may bleed and may be sensitive to brushing and flossing.  Dark spots or blotches (chloasma,  mask of pregnancy) may develop on your face. This will likely fade after the baby is born.  Your menstrual periods will stop.  You may have a loss of appetite.  You may develop cravings for certain kinds of food.  You may have changes in your emotions from day to day, such as being excited to be pregnant or being concerned that something may go wrong with the pregnancy and baby.  You may have more vivid and strange dreams.  You may have changes in your hair. These can include thickening of your hair, rapid growth, and changes in texture. Some women also have hair loss during or after pregnancy, or hair that feels dry or thin. Your hair will most likely return to normal after your baby is born. WHAT TO EXPECT AT YOUR PRENATAL VISITS During a routine prenatal visit:  You will be weighed to make sure you and the baby are growing normally.  Your blood pressure will be taken.  Your abdomen will be measured to track your baby's growth.  The fetal heartbeat will be listened to starting around week 10 or 12 of your pregnancy.  Test results from any previous visits will be discussed. Your health care provider may ask you:  How you are feeling.  If you are feeling the baby move.  If you have had any abnormal symptoms, such as leaking fluid, bleeding, severe headaches, or abdominal cramping.  If you are using any tobacco products,   including cigarettes, chewing tobacco, and electronic cigarettes.  If you have any questions. Other tests that may be performed during your first trimester include:  Blood tests to find your blood type and to check for the presence of any previous infections. They will also be used to check for low iron levels (anemia) and Rh antibodies. Later in the pregnancy, blood tests for diabetes will be done along with other tests if problems develop.  Urine tests to check for infections, diabetes, or protein in the urine.  An ultrasound to confirm the proper growth  and development of the baby.  An amniocentesis to check for possible genetic problems.  Fetal screens for spina bifida and Down syndrome.  You may need other tests to make sure you and the baby are doing well.  HIV (human immunodeficiency virus) testing. Routine prenatal testing includes screening for HIV, unless you choose not to have this test. HOME CARE INSTRUCTIONS  Medicines  Follow your health care provider's instructions regarding medicine use. Specific medicines may be either safe or unsafe to take during pregnancy.  Take your prenatal vitamins as directed.  If you develop constipation, try taking a stool softener if your health care provider approves. Diet  Eat regular, well-balanced meals. Choose a variety of foods, such as meat or vegetable-based protein, fish, milk and low-fat dairy products, vegetables, fruits, and whole grain breads and cereals. Your health care provider will help you determine the amount of weight gain that is right for you.  Avoid raw meat and uncooked cheese. These carry germs that can cause birth defects in the baby.  Eating four or five small meals rather than three large meals a day may help relieve nausea and vomiting. If you start to feel nauseous, eating a few soda crackers can be helpful. Drinking liquids between meals instead of during meals also seems to help nausea and vomiting.  If you develop constipation, eat more high-fiber foods, such as fresh vegetables or fruit and whole grains. Drink enough fluids to keep your urine clear or pale yellow. Activity and Exercise  Exercise only as directed by your health care provider. Exercising will help you:  Control your weight.  Stay in shape.  Be prepared for labor and delivery.  Experiencing pain or cramping in the lower abdomen or low back is a good sign that you should stop exercising. Check with your health care provider before continuing normal exercises.  Try to avoid standing for long  periods of time. Move your legs often if you must stand in one place for a long time.  Avoid heavy lifting.  Wear low-heeled shoes, and practice good posture.  You may continue to have sex unless your health care provider directs you otherwise. Relief of Pain or Discomfort  Wear a good support bra for breast tenderness.   Take warm sitz baths to soothe any pain or discomfort caused by hemorrhoids. Use hemorrhoid cream if your health care provider approves.   Rest with your legs elevated if you have leg cramps or low back pain.  If you develop varicose veins in your legs, wear support hose. Elevate your feet for 15 minutes, 3-4 times a day. Limit salt in your diet. Prenatal Care  Schedule your prenatal visits by the twelfth week of pregnancy. They are usually scheduled monthly at first, then more often in the last 2 months before delivery.  Write down your questions. Take them to your prenatal visits.  Keep all your prenatal visits as directed by your   health care provider. Safety  Wear your seat belt at all times when driving.  Make a list of emergency phone numbers, including numbers for family, friends, the hospital, and police and fire departments. General Tips  Ask your health care provider for a referral to a local prenatal education class. Begin classes no later than at the beginning of month 6 of your pregnancy.  Ask for help if you have counseling or nutritional needs during pregnancy. Your health care provider can offer advice or refer you to specialists for help with various needs.  Do not use hot tubs, steam rooms, or saunas.  Do not douche or use tampons or scented sanitary pads.  Do not cross your legs for long periods of time.  Avoid cat litter boxes and soil used by cats. These carry germs that can cause birth defects in the baby and possibly loss of the fetus by miscarriage or stillbirth.  Avoid all smoking, herbs, alcohol, and medicines not prescribed by  your health care provider. Chemicals in these affect the formation and growth of the baby.  Do not use any tobacco products, including cigarettes, chewing tobacco, and electronic cigarettes. If you need help quitting, ask your health care provider. You may receive counseling support and other resources to help you quit.  Schedule a dentist appointment. At home, brush your teeth with a soft toothbrush and be gentle when you floss. SEEK MEDICAL CARE IF:   You have dizziness.  You have mild pelvic cramps, pelvic pressure, or nagging pain in the abdominal area.  You have persistent nausea, vomiting, or diarrhea.  You have a bad smelling vaginal discharge.  You have pain with urination.  You notice increased swelling in your face, hands, legs, or ankles. SEEK IMMEDIATE MEDICAL CARE IF:   You have a fever.  You are leaking fluid from your vagina.  You have spotting or bleeding from your vagina.  You have severe abdominal cramping or pain.  You have rapid weight gain or loss.  You vomit blood or material that looks like coffee grounds.  You are exposed to [place] measles and have never had them.  You are exposed to fifth disease or chickenpox.  You develop a severe headache.  You have shortness of breath.  You have any kind of trauma, such as from a fall or a car accident.   This information is not intended to replace advice given to you by your health care provider. Make sure you discuss any questions you have with your health care provider.   Document Released: 08/05/2001 Document Revised: 09/01/2014 Document Reviewed: 06/21/2013 Elsevier Interactive Patient Education 2016 [employer].   Safe Medications in Pregnancy   Acne: Benzoyl Peroxide Salicylic Acid  Backache/Headache: Tylenol: 2 regular strength every 4 hours OR              2 Extra strength every 6 hours  Colds/Coughs/Allergies: Benadryl (alcohol free) 25 mg every 6 hours as needed Breath right  strips Claritin Cepacol throat lozenges Chloraseptic throat spray Cold-Eeze- up to three times per day Cough drops, alcohol free Flonase (by prescription only) Guaifenesin Mucinex Robitussin DM (plain only, alcohol free) Saline nasal spray/drops Sudafed (pseudoephedrine) & Actifed ** use only after [redacted] weeks gestation and if you do not have high blood pressure Tylenol Vicks Vaporub Zinc lozenges Zyrtec   Constipation: Colace Ducolax suppositories Fleet enema Glycerin suppositories Metamucil Milk of magnesia Miralax Senokot Smooth move tea  Diarrhea: Kaopectate Imodium A-D  *NO pepto Bismol  Hemorrhoids: Anusol  Anusol HC Preparation H Tucks  Indigestion: Tums Maalox Mylanta Zantac  Pepcid  Insomnia: Benadryl (alcohol free) 25mg  every 6 hours as needed Tylenol PM Unisom, no Gelcaps  Leg Cramps: Tums MagGel  Nausea/Vomiting:  Bonine Dramamine Emetrol Ginger extract Sea bands Meclizine  Nausea medication to take during pregnancy:  Unisom (doxylamine succinate 25 mg tablets) Take one tablet daily at bedtime. If symptoms are not adequately controlled, the dose can be increased to a maximum recommended dose of two tablets daily (1/2 tablet in the morning, 1/2 tablet mid-afternoon and one at bedtime). Vitamin B6 100mg  tablets. Take one tablet twice a day (up to 200 mg per day).  Skin Rashes: Aveeno products Benadryl cream or 25mg  every 6 hours as needed Calamine Lotion 1% cortisone cream  Yeast infection: Gyne-lotrimin 7 Monistat 7  Gum/tooth pain: Anbesol  **If taking multiple medications, please check labels to avoid duplicating the same active ingredients **take medication as directed on the label ** Do not exceed 4000 mg of tylenol in 24 hours **Do not take medications that contain aspirin or ibuprofen  Commonly Asked Questions During Pregnancy  Cats: A parasite can be excreted in cat feces.  To avoid exposure you need to have another  person empty the little box.  If you must empty the litter box you will need to wear gloves.  Wash your hands after handling your cat.  This parasite can also be found in raw or undercooked meat so this should also be avoided.  Colds, Sore Throats, Flu: Please check your medication sheet to see what you can take for symptoms.  If your symptoms are unrelieved by these medications please call the office.  Dental Work: Most any dental work Agricultural consultant recommends is permitted.  X-rays should only be taken during the first trimester if absolutely necessary.  Your abdomen should be shielded with a lead apron during all x-rays.  Please notify your provider prior to receiving any x-rays.  Novocaine is fine; gas is not recommended.  If your dentist requires a note from [place] prior to dental work please call the office and we will provide one for you.  Exercise: Exercise is an important part of staying healthy during your pregnancy.  You may continue most exercises you were accustomed to prior to pregnancy.  Later in your pregnancy you will most likely notice you have difficulty with activities requiring balance like riding a bicycle.  It is important that you listen to your body and avoid activities that put you at a higher risk of falling.  Adequate rest and staying well hydrated are a must!  If you have questions about the safety of specific activities ask your provider.    Exposure to Children with illness: Try to avoid obvious exposure; report any symptoms to [place] when noted,  If you have chicken pos, red measles or mumps, you should be immune to these diseases.   Please do not take any vaccines while pregnant unless you have checked with your OB provider.  Fetal Movement: After 28 weeks we recommend you do "kick counts" twice daily.  Lie or sit down in a calm quiet environment and count your baby movements "kicks".  You should feel your baby at least 10 times per hour.  If you have not felt 10 kicks within the  first hour get up, walk around and have something sweet to eat or drink then repeat for an additional hour.  If count remains less than 10 per hour notify your provider.  Fumigating: Follow your pest control agent's advice as to how long to stay out of your home.  Ventilate the area well before re-entering.  Hemorrhoids:   Most over-the-counter preparations can be used during pregnancy.  Check your medication to see what is safe to use.  It is important to use a stool softener or fiber in your diet and to drink lots of liquids.  If hemorrhoids seem to be getting worse please call the office.   Hot Tubs:  Hot tubs Jacuzzis and saunas are not recommended while pregnant.  These increase your internal body temperature and should be avoided.  Intercourse:  Sexual intercourse is safe during pregnancy as long as you are comfortable, unless otherwise advised by your provider.  Spotting may occur after intercourse; report any bright red bleeding that is heavier than spotting.  Labor:  If you know that you are in labor, please go to the hospital.  If you are unsure, please call the office and let us help you decide what to do.  Lifting, straining, etc:  If your job requires heavy lifting or straining please check with your provider for any limitations.  Generally, you should not lift items heavier than that you can lift simply with your hands and arms (no back muscles)  Painting:  Paint fumes do not harm your pregnancy, but may make you ill and should be avoided if possible.  Latex or water based paints have less odor than oils.  Use adequate ventilation while painting.  Permanents & Hair Color:  Chemicals in hair dyes are not recommended as they cause increase hair dryness which can increase hair loss during pregnancy.  " Highlighting" and permanents are allowed.  Dye may be absorbed differently and permanents may not hold as well during pregnancy.  Sunbathing:  Use a sunscreen, as skin burns easily during  pregnancy.  Drink plenty of fluids; avoid over heating.  Tanning Beds:  Because their possible side effects are still unknown, tanning beds are not recommended.  Ultrasound Scans:  Routine ultrasounds are performed at approximately 20 weeks.  You will be able to see your baby's general anatomy an if you would like to know the gender this can usually be determined as well.  If it is questionable when you conceived you may also receive an ultrasound early in your pregnancy for dating purposes.  Otherwise ultrasound exams are not routinely performed unless there is a medical necessity.  Although you can request a scan we ask that you pay for it when conducted because insurance does not cover " patient request" scans.  Work: If your pregnancy proceeds without complications you may work until your due date, unless your physician or employer advises otherwise.  Round Ligament Pain/Pelvic Discomfort:  Sharp, shooting pains not associated with bleeding are fairly common, usually occurring in the second trimester of pregnancy.  They tend to be worse when standing up or when you remain standing for long periods of time.  These are the result of pressure of certain pelvic ligaments called "round ligaments".  Rest, Tylenol and heat seem to be the most effective relief.  As the womb and fetus grow, they rise out of the pelvis and the discomfort improves.  Please notify the office if your pain seems different than that described.  It may represent a more serious condition.

## 2015-09-04 NOTE — Progress Notes (Signed)
Pt states she has history of ovarian cyst which she thinks is causing her pain. This happens after she has sex or with exercise.

## 2015-09-04 NOTE — Progress Notes (Signed)
  Subjective:    Carrie [name] is a [id number] being seen today for her first obstetrical visit.  Her obstetrical history is significant for SAB & ectopic. Patient does intend to breast feed. Pregnancy history fully reviewed.  Patient reports occassional abdominal cramping. Reports some LLQ cramping after intercourse & exercise. Patient states continues to exercise and does squats with weights. Has decreased weights to 20#. Discussed decreasing weights further & to stop doing squats if they induce abdominal pain.   Filed Vitals:   09/04/15 1323  BP: 118/70  Pulse: 77  Temp: 98.3 F (36.8 C)  Weight: 146 lb 4.8 oz (66.361 kg)    HISTORY: OB History  Gravida Para Term Preterm AB SAB TAB Ectopic Multiple Living  3 0 0 0 2 0 1 1      # Outcome Date GA Lbr Len/2nd Weight Sex Delivery Anes PTL Lv  3 Current           2 Ectopic              Comments: System Generated. Please review and update pregnancy details.  1 TAB              Comments: "underage, took abortion pill, no complications"     Past Medical History  Diagnosis Date  . Heart murmur     at birth  . Infection     UTI  . Anxiety     hx of meds   Past Surgical History  Procedure Laterality Date  . No past surgeries     Family History  Problem Relation Age of Onset  . Hypertension Father   . Hyperlipidemia Father   . Diabetes Maternal Grandfather   . Cancer Paternal Grandmother   . Cancer Paternal Grandfather   . Stroke Paternal Grandfather   . Heart disease Paternal Grandfather   . Hearing loss Neg Hx      Exam     Skin: normal coloration and turgor, no rashes    Neurologic: oriented, normal, normal mood   Extremities: no deformities, ROM of all joints is normal   HEENT PERRLA   Mouth/Teeth mucous membranes moist, pharynx normal without lesions and dental hygiene good   Neck supple and no masses   Cardiovascular: regular rate and rhythm   Respiratory:  appears well, vitals normal, no respiratory  distress, acyanotic, normal RR, neck free of mass or lymphadenopathy, chest clear, no wheezing, crepitations, rhonchi, normal symmetric air entry   Abdomen: soft, non-tender; bowel sounds normal; no masses,  no organomegaly    GC/CT negative in [facility] Normal pap smear 06/2014  Assessment:    Pregnancy: G3P0020 Patient Active Problem List   Diagnosis Date Noted  . Pregnancy 09/04/2015  . Early stage of pregnancy 08/06/2015        Plan:  1. Pregnancy  - Prescript Monitor Profile(19) - Prenatal Profile - Hemoglobinopathy evaluation - US MFM Fetal Nuchal Translucency; Future    Initial labs drawn. Prenatal vitamins. Problem list reviewed and updated. Genetic Screening discussed First Screen: requested.  Ultrasound discussed; fetal survey: requested.  Follow up in 4 weeks.    Judeth Hornrin Tida Saner 09/04/2015

## 2015-09-05 LAB — PRESCRIPTION MONITORING PROFILE (19 PANEL)
Amphetamine/Meth: NEGATIVE ng/mL
BENZODIAZEPINE SCREEN, URINE: NEGATIVE ng/mL
BUPRENORPHINE, URINE: NEGATIVE ng/mL
Barbiturate Screen, Urine: NEGATIVE ng/mL
COCAINE METABOLITES: NEGATIVE ng/mL
CREATININE, URINE: 58.05 mg/dL (ref >20.0)
Cannabinoid Scrn, Ur: NEGATIVE ng/mL
Carisoprodol, Urine: NEGATIVE ng/mL
ECSTASY: NEGATIVE ng/mL
FENTANYL URINE: NEGATIVE ng/mL
MEPERIDINE UR: NEGATIVE ng/mL
METHADONE SCREEN, URINE: NEGATIVE ng/mL
Methaqualone: NEGATIVE ng/mL
NITRITES URINE, INITIAL: NEGATIVE ug/mL
Opiate Screen, Urine: NEGATIVE ng/mL
Oxycodone Screen, Ur: NEGATIVE ng/mL
PH URINE, INITIAL: 7.4 pH (ref 4.5–8.9)
PROPOXYPHENE: NEGATIVE ng/mL
Phencyclidine, Ur: NEGATIVE ng/mL
TRAMADOL UR: NEGATIVE ng/mL
Tapentadol, urine: NEGATIVE ng/mL
ZOLPIDEM, URINE: NEGATIVE ng/mL

## 2015-09-05 LAB — PRENATAL PROFILE (SOLSTAS)
Antibody Screen: NEGATIVE
BASOS ABS: 0 10*3/uL (ref 0.0–0.1)
Basophils Relative: 0 % (ref 0–1)
EOS PCT: 1 % (ref 0–5)
Eosinophils Absolute: 0.1 10*3/uL (ref 0.0–0.7)
HCT: 40.9 % (ref 36.0–46.0)
HIV: NONREACTIVE
Hemoglobin: 13.7 g/dL (ref 12.0–15.0)
Hepatitis B Surface Ag: NEGATIVE
LYMPHS ABS: 1.8 10*3/uL (ref 0.7–4.0)
LYMPHS PCT: 32 % (ref 12–46)
MCH: 30.5 pg (ref 26.0–34.0)
MCHC: 33.5 g/dL (ref 30.0–36.0)
MCV: 91.1 fL (ref 78.0–100.0)
MONO ABS: 0.5 10*3/uL (ref 0.1–1.0)
MONOS PCT: 9 % (ref 3–12)
MPV: 9.4 fL (ref 8.6–12.4)
Neutro Abs: 3.2 10*3/uL (ref 1.7–7.7)
Neutrophils Relative %: 58 % (ref 43–77)
PLATELETS: 240 10*3/uL (ref 150–400)
RBC: 4.49 MIL/uL (ref 3.87–5.11)
RDW: 12.8 % (ref 11.5–15.5)
RH TYPE: POSITIVE
Rubella: 17.5 Index — ABNORMAL HIGH (ref <0.90)
WBC: 5.6 10*3/uL (ref 4.0–10.5)

## 2015-09-05 LAB — CULTURE, OB URINE

## 2015-09-06 LAB — HEMOGLOBINOPATHY EVALUATION
HGB F QUANT: 0 % (ref 0.0–2.0)
Hemoglobin Other: 0 %
Hgb A2 Quant: 2.7 % (ref 2.2–3.2)
Hgb A: 97.3 % (ref 96.8–97.8)
Hgb S Quant: 0 %

## 2015-09-11 ENCOUNTER — Encounter: Payer: Self-pay | Admitting: Family Medicine

## 2015-09-11 DIAGNOSIS — F411 Generalized anxiety disorder: Secondary | ICD-10-CM | POA: Insufficient documentation

## 2015-09-24 ENCOUNTER — Other Ambulatory Visit: Payer: Self-pay | Admitting: Student

## 2015-09-24 ENCOUNTER — Ambulatory Visit (HOSPITAL_COMMUNITY)
Admission: RE | Admit: 2015-09-24 | Discharge: 2015-09-24 | Disposition: A | Discharge status: Home / Self Care | Payer: Managed Care, Other (non HMO) | Source: Ambulatory Visit | Attending: Student | Admitting: Student

## 2015-09-24 ENCOUNTER — Encounter (HOSPITAL_COMMUNITY): Payer: Self-pay

## 2015-09-24 DIAGNOSIS — Z36 Encounter for antenatal screening of mother: Secondary | ICD-10-CM | POA: Insufficient documentation

## 2015-09-24 DIAGNOSIS — Z3490 Encounter for supervision of normal pregnancy, unspecified, unspecified trimester: Secondary | ICD-10-CM

## 2015-09-24 DIAGNOSIS — Z369 Encounter for antenatal screening, unspecified: Secondary | ICD-10-CM

## 2015-09-24 DIAGNOSIS — Z3A12 12 weeks gestation of pregnancy: Secondary | ICD-10-CM

## 2015-09-28 ENCOUNTER — Other Ambulatory Visit (HOSPITAL_COMMUNITY): Payer: Self-pay

## 2015-10-02 ENCOUNTER — Encounter: Payer: Managed Care, Other (non HMO) | Admitting: Student

## 2015-10-03 ENCOUNTER — Ambulatory Visit (INDEPENDENT_AMBULATORY_CARE_PROVIDER_SITE_OTHER): Payer: Managed Care, Other (non HMO) | Admitting: Student

## 2015-10-03 VITALS — BP 115/67 | HR 88 | Temp 99.0°F | Wt 149.0 lb

## 2015-10-03 DIAGNOSIS — Z3402 Encounter for supervision of normal first pregnancy, second trimester: Secondary | ICD-10-CM | POA: Diagnosis not present

## 2015-10-03 LAB — POCT URINALYSIS DIP (DEVICE)
Bilirubin Urine: NEGATIVE
Glucose, UA: NEGATIVE mg/dL
Hgb urine dipstick: NEGATIVE
KETONES UR: NEGATIVE mg/dL
Leukocytes, UA: NEGATIVE
Nitrite: NEGATIVE
PH: 6 (ref 5.0–8.0)
PROTEIN: NEGATIVE mg/dL
SPECIFIC GRAVITY, URINE: 1.025 (ref 1.005–1.030)
UROBILINOGEN UA: 0.2 mg/dL (ref 0.0–1.0)

## 2015-10-03 NOTE — Progress Notes (Signed)
Breastfeeding tip of the week reviewed C/o nasal congestion/runniness, productive cough x2 weeks

## 2015-10-03 NOTE — Patient Instructions (Signed)

## 2015-10-03 NOTE — Progress Notes (Signed)
Congestion, runny nose, cough x 2+ weeks No fever No ear pain Sore throat initially, not now Exam normal other than some PND Wanted to take herbalife thermogenic tea Subjective:  Carrie [name] is a 25 y.o. G3P0020 at [redacted]w[redacted]d being seen today for ongoing prenatal care.  She is currently monitored for the following issues for this low-risk pregnancy and has Supervision of normal first pregnancy and Generalized anxiety disorder on her problem list.  Patient reports cold symptoms x 2+ weeks. Productive cough, nasal congestion & drainage. Denies fever/chills, ear pain, or sore throat. Has not treated. Contractions: Not present. Vag. Bleeding: None.   . Denies leaking of fluid.  Currently taking Herbalife Fat Burning Tea daily & wants to know if that's safe in pregnancy.   The following portions of the patient's history were reviewed and updated as appropriate: allergies, current medications, past family history, past medical history, past social history, past surgical history and problem list. Problem list updated.  Objective:   Filed Vitals:   10/03/15 1345  BP: 115/67  Pulse: 88  Temp: 99 F (37.2 C)  Weight: 149 lb (67.586 kg)    Fetal Status: Fetal Heart Rate (bpm): 156         General:  Alert, oriented and cooperative. Patient is in no acute distress.  HEENT No lymphadenopathy. Tympanic membranes clear, no erythema. Orpharynx - no erythema or edema. Post nasal drainage noted. Nares erythematous. No swelling.   Skin: Skin is warm and dry. No rash noted.   Cardiovascular: Normal heart rate noted  Respiratory: Normal respiratory effort, no problems with respiration noted  Abdomen: Soft, gravid, appropriate for gestational age. Pain/Pressure: Present     Pelvic: Vag. Bleeding: None     Cervical exam deferred        Extremities: Normal range of motion.  Edema: None  Mental Status: Normal mood and affect. Normal behavior. Normal judgment and thought content.   Urinalysis: Urine Protein:  Negative Urine Glucose: Negative  Assessment and Plan:  Pregnancy: G3P0020 at [redacted]w[redacted]d  1. Encounter for supervision of normal first pregnancy in second trimester  - [place] MFM OB COMP + 14 WK; Future  No recommended that patient continues taking the fat burning tea.  Meds safe in pregnancy info given. Recommended zyrtec & mucinex.   Preterm labor symptoms and general obstetric precautions including but not limited to vaginal bleeding, contractions, leaking of fluid and fetal movement were reviewed in detail with the patient. Please refer to After Visit Summary for other counseling recommendations.  Return in about 4 weeks (around 10/31/2015) for Routine OB.   [name], NP

## 2015-10-31 ENCOUNTER — Ambulatory Visit (INDEPENDENT_AMBULATORY_CARE_PROVIDER_SITE_OTHER): Payer: Medicaid Other | Admitting: Advanced Practice Midwife

## 2015-10-31 ENCOUNTER — Ambulatory Visit (HOSPITAL_COMMUNITY)
Admission: RE | Admit: 2015-10-31 | Discharge: 2015-10-31 | Disposition: A | Discharge status: Home / Self Care | Payer: Medicaid Other | Source: Ambulatory Visit | Attending: Student | Admitting: Student

## 2015-10-31 ENCOUNTER — Encounter: Payer: Self-pay | Admitting: Advanced Practice Midwife

## 2015-10-31 ENCOUNTER — Telehealth: Payer: Self-pay

## 2015-10-31 VITALS — BP 109/62 | HR 99 | Temp 99.0°F | Wt 153.5 lb

## 2015-10-31 DIAGNOSIS — Z3402 Encounter for supervision of normal first pregnancy, second trimester: Secondary | ICD-10-CM

## 2015-10-31 DIAGNOSIS — Z3492 Encounter for supervision of normal pregnancy, unspecified, second trimester: Secondary | ICD-10-CM

## 2015-10-31 DIAGNOSIS — Z3A18 18 weeks gestation of pregnancy: Secondary | ICD-10-CM | POA: Insufficient documentation

## 2015-10-31 DIAGNOSIS — Z36 Encounter for antenatal screening of mother: Secondary | ICD-10-CM | POA: Diagnosis present

## 2015-10-31 LAB — POCT URINALYSIS DIP (DEVICE)
Bilirubin Urine: NEGATIVE
Glucose, UA: NEGATIVE mg/dL
HGB URINE DIPSTICK: NEGATIVE
Ketones, ur: NEGATIVE mg/dL
LEUKOCYTES UA: NEGATIVE
Nitrite: NEGATIVE
PH: 6.5 (ref 5.0–8.0)
Protein, ur: NEGATIVE mg/dL
SPECIFIC GRAVITY, URINE: 1.025 (ref 1.005–1.030)
UROBILINOGEN UA: 0.2 mg/dL (ref 0.0–1.0)

## 2015-10-31 NOTE — Patient Instructions (Signed)

## 2015-10-31 NOTE — Progress Notes (Signed)
Breastfeeding tip of the week reviewed. 

## 2015-10-31 NOTE — Progress Notes (Signed)
Subjective:  Carrie [name] is a 25 y.o. G3P0020 at 2665w0d being seen today for ongoing prenatal care.  She is currently monitored for the following issues for this low-risk pregnancy and has Supervision of normal first pregnancy and Generalized anxiety disorder on her problem list.  Patient reports no complaints.  Contractions: Not present. Vag. Bleeding: None.  Movement: Present. Denies leaking of fluid.   The following portions of the patient's history were reviewed and updated as appropriate: allergies, current medications, past family history, past medical history, past social history, past surgical history and problem list. Problem list updated.  Objective:   Filed Vitals:   10/31/15 0819  BP: 109/62  Pulse: 99  Temp: 99 F (37.2 C)  Weight: 153 lb 8 oz (69.627 kg)    Fetal Status: Fetal Heart Rate (bpm): 144   Movement: Present     General:  Alert, oriented and cooperative. Patient is in no acute distress.  Skin: Skin is warm and dry. No rash noted.   Cardiovascular: Normal heart rate noted  Respiratory: Normal respiratory effort, no problems with respiration noted  Abdomen: Soft, gravid, appropriate for gestational age. Pain/Pressure: Absent     Pelvic: Vag. Bleeding: None     Cervical exam deferred        Extremities: Normal range of motion.  Edema: None  Mental Status: Normal mood and affect. Normal behavior. Normal judgment and thought content.   Urinalysis: Urine Protein: Negative Urine Glucose: Negative  Assessment and Plan:  Pregnancy: G3P0020 at 165w0d  1. Supervision of normal pregnancy, second trimester      Anatomy US today - Alpha fetoprotein, maternal  2. Encounter for supervision of normal first pregnancy in second trimester   Preterm labor symptoms and general obstetric precautions including but not limited to vaginal bleeding, contractions, leaking of fluid and fetal movement were reviewed in detail with the patient. Please refer to After Visit Summary  for other counseling recommendations.  RTC 4 weeks  Aviva SignsMarie L Williams, CNM

## 2015-10-31 NOTE — Addendum Note (Signed)
Addended by: Garret ReddishBARNES, Nashaun Hillmer M on: 10/31/2015 12:15 PM   Modules accepted: Orders

## 2015-10-31 NOTE — Telephone Encounter (Signed)
Called patient because she left for u/s appointment and was to return for quad screen. Patient stated that she decided she did not want a quad screen after all, she was under the impression that it was optional for her to return. Will cancel ordered quad screen.

## 2015-11-01 NOTE — Progress Notes (Signed)
Late entry 11/01/15 4;30 Medicaid home form completed.

## 2015-11-06 ENCOUNTER — Ambulatory Visit (HOSPITAL_COMMUNITY): Payer: Managed Care, Other (non HMO)

## 2015-11-19 ENCOUNTER — Ambulatory Visit (INDEPENDENT_AMBULATORY_CARE_PROVIDER_SITE_OTHER): Payer: Managed Care, Other (non HMO)

## 2015-11-19 DIAGNOSIS — O2342 Unspecified infection of urinary tract in pregnancy, second trimester: Secondary | ICD-10-CM

## 2015-11-19 LAB — POCT URINALYSIS DIP (DEVICE)
Bilirubin Urine: NEGATIVE
Glucose, UA: NEGATIVE mg/dL
HGB URINE DIPSTICK: NEGATIVE
Ketones, ur: NEGATIVE mg/dL
LEUKOCYTES UA: NEGATIVE
NITRITE: NEGATIVE
PH: 5.5 (ref 5.0–8.0)
PROTEIN: NEGATIVE mg/dL
SPECIFIC GRAVITY, URINE: 1.01 (ref 1.005–1.030)
UROBILINOGEN UA: 0.2 mg/dL (ref 0.0–1.0)

## 2015-11-19 NOTE — Progress Notes (Signed)
Patient ID: Carrie [name], female   DOB: 04/15/1991, 25 y.o.   MRN: [id number] Pt presents to the Clinic concerned about a possible UTI. Ran an initial urine analysis and results came back normal. A urine culture is being sent of for further testing. Pt reports diarrhea for the past four days and I instructed and gave patient information on medications she can take for this symptom and told her to follow up with her HCP if those symptoms worsen. FHR was 146 upon assessment. Pt reports a stretching pain in her lower abdomen and asked if that was normal, informed patient that baby is growing and that her uterus is growing so that stretching is a normal feeling . Consulted with my coworkers and they agreed with the findings. Pt was sent home and we will call her with results of the urine culture. Pt reports understanding with no further questions. Told her to call if she has any other questions or concerns.

## 2015-11-20 LAB — URINE CULTURE
Colony Count: NO GROWTH
Organism ID, Bacteria: NO GROWTH

## 2015-11-28 ENCOUNTER — Ambulatory Visit (INDEPENDENT_AMBULATORY_CARE_PROVIDER_SITE_OTHER): Payer: Medicaid Other | Admitting: Certified Nurse Midwife

## 2015-11-28 VITALS — BP 105/68 | HR 88 | Wt 160.4 lb

## 2015-11-28 DIAGNOSIS — Z3402 Encounter for supervision of normal first pregnancy, second trimester: Secondary | ICD-10-CM

## 2015-11-28 LAB — POCT URINALYSIS DIP (DEVICE)
Bilirubin Urine: NEGATIVE
Glucose, UA: NEGATIVE mg/dL
Ketones, ur: NEGATIVE mg/dL
Leukocytes, UA: NEGATIVE
Nitrite: NEGATIVE
Protein, ur: NEGATIVE mg/dL
Specific Gravity, Urine: 1.02 (ref 1.005–1.030)
Urobilinogen, UA: 0.2 mg/dL (ref 0.0–1.0)
pH: 7 (ref 5.0–8.0)

## 2015-11-28 NOTE — Addendum Note (Signed)
Addended by: Cheree DittoGRAHAM, DEMETRICE A on: 11/28/2015 09:04 AM   Modules accepted: Orders

## 2015-11-28 NOTE — Addendum Note (Signed)
Addended by: Cheree DittoGRAHAM, DEMETRICE A on: 11/28/2015 09:02 AM   Modules accepted: Orders

## 2015-11-28 NOTE — Patient Instructions (Signed)
Glucose Tolerance Test During Pregnancy The glucose tolerance test (GTT) is a blood test used to determine if you have developed a type of diabetes during pregnancy (gestational diabetes). This is when your body does not properly process sugar (glucose) in the food you eat, resulting in high blood glucose levels. Typically, a GTT is done after you have had a 1-hour glucose test with results that indicate you possibly have gestational diabetes. It may also be done if:  You have a history of giving birth to very large babies or have experienced repeated fetal loss (stillbirth).   You have signs and symptoms of diabetes, such as:   Changes in your vision.   Tingling or numbness in your hands or feet.   Changes in hunger, thirst, and urination not otherwise explained by your pregnancy.  The GTT lasts about 3 hours. You will be given a sugar-water solution to drink at the beginning of the test. You will have blood drawn before you drink the solution and then again 1, 2, and 3 hours after you drink it. You will not be allowed to eat or drink anything else during the test. You must remain at the testing location to make sure that your blood is drawn on time. You should also avoid exercising during the test, because exercise can alter test results. PREPARATION FOR TEST  Eat normally for 3 days prior to the GTT test, including having plenty of carbohydrate-rich foods. Do not eat or drink anything except water during the final 12 hours before the test. In addition, your health care provider may ask you to stop taking certain medicines before the test. RESULTS  It is your responsibility to obtain your test results. Ask the lab or department performing the test when and how you will get your results. Contact your health care provider to discuss any questions you have about your results.  Range of Normal Values Ranges for normal values may vary among different labs and hospitals. You should always check  with your health care provider after having lab work or other tests done to discuss whether your values are considered within normal limits. Normal levels of blood glucose are as follows:  Fasting: less than 105 mg/dL.   1 hour after drinking the solution: less than 190 mg/dL.   2 hours after drinking the solution: less than 165 mg/dL.   3 hours after drinking the solution: less than 145 mg/dL.  Some substances can interfere with GTT results. These may include:  Blood pressure and heart failure medicines, including beta blockers, furosemide, and thiazides.   Anti-inflammatory medicines, including aspirin.   Nicotine.   Some psychiatric medicines.  Meaning of Results Outside Normal Value Ranges GTT test results that are above normal values may indicate a number of health problems, such as:   Gestational diabetes.   Acute stress response.   Cushing syndrome.   Tumors such as pheochromocytoma or glucagonoma.   Long-term kidney problems.   Pancreatitis.   Hyperthyroidism.   Current infection.  Discuss your test results with your health care provider. He or she will use the results to make a diagnosis and determine a treatment plan that is right for you.   This information is not intended to replace advice given to you by your health care provider. Make sure you discuss any questions you have with your health care provider.   Document Released: 02/10/2012 Document Revised: 09/01/2014 Document Reviewed: 12/16/2013 Elsevier Interactive Patient Education 2016 Elsevier Inc.  

## 2015-11-28 NOTE — Progress Notes (Signed)
Subjective:  Carrie [name] is a 25 y.o. G3P0020 at 5658w0d being seen today for ongoing prenatal care.  She is currently monitored for the following issues for this low-risk pregnancy and has Supervision of normal first pregnancy and Generalized anxiety disorder on her problem list.  Patient reports no complaints.  Contractions: Not present. Vag. Bleeding: None.  Movement: Present. Denies leaking of fluid.   The following portions of the patient's history were reviewed and updated as appropriate: allergies, current medications, past family history, past medical history, past social history, past surgical history and problem list. Problem list updated.  Objective:   Filed Vitals:   11/28/15 0846  BP: 105/68  Pulse: 88  Weight: 160 lb 6.4 oz (72.757 kg)    Fetal Status: Fetal Heart Rate (bpm): 145   Movement: Present     General:  Alert, oriented and cooperative. Patient is in no acute distress.  Skin: Skin is warm and dry. No rash noted.   Cardiovascular: Normal heart rate noted  Respiratory: Normal respiratory effort, no problems with respiration noted  Abdomen: Soft, gravid, appropriate for gestational age. Pain/Pressure: Absent     Pelvic: Vag. Bleeding: None     Cervical exam deferred        Extremities: Normal range of motion.  Edema: None  Mental Status: Normal mood and affect. Normal behavior. Normal judgment and thought content.   Urinalysis: Urine Protein: Negative Urine Glucose: Negative  Assessment and Plan:  Pregnancy: G3P0020 at 7258w0d  1. Encounter for supervision of normal first pregnancy in second trimester   Preterm labor symptoms and general obstetric precautions including but not limited to vaginal bleeding, contractions, leaking of fluid and fetal movement were reviewed in detail with the patient. Please refer to After Visit Summary for other counseling recommendations.  Return in about 4 weeks (around 12/26/2015) for schedule follow up ob ultrasound.   Rhea PinkLori A  Traxton Kolenda, CNM

## 2015-12-05 ENCOUNTER — Ambulatory Visit (HOSPITAL_COMMUNITY): Payer: Medicaid Other

## 2015-12-07 ENCOUNTER — Ambulatory Visit (HOSPITAL_COMMUNITY)
Admission: RE | Admit: 2015-12-07 | Discharge: 2015-12-07 | Disposition: A | Discharge status: Home / Self Care | Payer: Medicaid Other | Source: Ambulatory Visit | Attending: Certified Nurse Midwife | Admitting: Certified Nurse Midwife

## 2015-12-07 DIAGNOSIS — Z36 Encounter for antenatal screening of mother: Secondary | ICD-10-CM | POA: Insufficient documentation

## 2015-12-07 DIAGNOSIS — Z3A23 23 weeks gestation of pregnancy: Secondary | ICD-10-CM | POA: Insufficient documentation

## 2015-12-07 DIAGNOSIS — Z3402 Encounter for supervision of normal first pregnancy, second trimester: Secondary | ICD-10-CM

## 2015-12-10 ENCOUNTER — Other Ambulatory Visit (HOSPITAL_COMMUNITY): Payer: Self-pay | Admitting: *Deleted

## 2015-12-10 DIAGNOSIS — O359XX0 Maternal care for (suspected) fetal abnormality and damage, unspecified, not applicable or unspecified: Secondary | ICD-10-CM

## 2015-12-25 ENCOUNTER — Ambulatory Visit (INDEPENDENT_AMBULATORY_CARE_PROVIDER_SITE_OTHER): Payer: Medicaid Other | Admitting: Family

## 2015-12-25 VITALS — BP 115/69 | HR 77 | Wt 166.5 lb

## 2015-12-25 DIAGNOSIS — Z3402 Encounter for supervision of normal first pregnancy, second trimester: Secondary | ICD-10-CM

## 2015-12-25 LAB — POCT URINALYSIS DIP (DEVICE)
BILIRUBIN URINE: NEGATIVE
Glucose, UA: NEGATIVE mg/dL
Hgb urine dipstick: NEGATIVE
KETONES UR: NEGATIVE mg/dL
Leukocytes, UA: NEGATIVE
Nitrite: NEGATIVE
PROTEIN: NEGATIVE mg/dL
Specific Gravity, Urine: 1.02 (ref 1.005–1.030)
Urobilinogen, UA: 0.2 mg/dL (ref 0.0–1.0)
pH: 8 (ref 5.0–8.0)

## 2015-12-25 NOTE — Progress Notes (Signed)
Pt c/o of lower pelvic pain.

## 2015-12-25 NOTE — Patient Instructions (Signed)
Please call [phone] or [phone] to schedule childbirth education classes or visit [email or web address] for a description of the classes.  AREA PEDIATRIC/FAMILY PRACTICE PHYSICIANS  [facility] [address] [place], [place] [address] Phone - [phone]   Fax - [phone]  [facility] [address] [place], [place]  [address] Phone - [phone]   Fax - [phone]  [facility] [address] [place], [place]  [address] Phone - [phone]   Fax - [phone]  [facility] [address] [place], [place]  [address] Phone - [phone]   Fax - [phone]  [facility] [address] [place], [place]  [address] Phone - [phone]   Fax - [phone]  [facility] [address] [place], [place]  [address] Phone - [phone]   Fax - [phone]  [facility] [address] [place], [place]  [address] Phone - [phone]   Fax - [phone]  [facility] [address] [place], [place]  [address] Phone - [phone]   Fax - [phone]  [facility] [address] [place], [place]  [address] Phone - [phone]   Fax - [phone] [facility] [address] [place], [place]  [address] Phone - [phone]   Fax - [phone]  [facility] [address] [place], [place]  [address] Phone - [phone]   Fax - [phone]  [facility] [address] [place], [place]  [address] Phone - [phone]   Fax - [phone]  [facility] [address] [place], [place]  [address] Phone - [phone]   Fax - [phone]  [facility] [address] [place], [place]  [address] Phone - [phone]  [facility] [address] [place], [place]  [address] Phone - [phone]   Fax - [phone]  [facility] [address] [place], [place]  [address] Phone - [phone]   Fax - [phone]  [facility] [address] [place], [place]  [address] Phone - [phone]   Fax - [phone]  [facility] [address] [place], [place]  [address] Phone - [phone]   Fax - [phone]   [facility] [address] [place], [place]  [address] Phone - [phone]   Fax - [phone]  [facility] [address] [place], [place]  [address] Phone - [phone]   Fax - [phone]  [facility] [address] [place], [place]  [address] Phone - [phone]   Fax - [phone]  [facility] [address] [place], [place]  [address] Phone - [phone]   Fax - [phone]  [facility] [address] , [place]  [address] Phone - [phone]   Fax - [phone] [facility] [address] [place], [place]  [address] Phone - [phone]   Fax - [phone]  [facility] [address] [place], [place]  [address] Phone - [phone]   Fax - [phone]   [facility] [address] [place],   [address] Phone - [phone]   Fax - [phone]   

## 2015-12-25 NOTE — Progress Notes (Signed)
Subjective:  Carrie [name] is a 25 y.o. G3P0020 at 4276w6d being seen today for ongoing prenatal care.  She is currently monitored for the following issues for this low-risk pregnancy and has Supervision of normal first pregnancy and Generalized anxiety disorder on her problem list.  Patient reports intermittent lower groin pain.  Denies bleeding.  Occurs only in morning.   . Vag. Bleeding: None.  Movement: Present. Denies leaking of fluid.   The following portions of the patient's history were reviewed and updated as appropriate: allergies, current medications, past family history, past medical history, past social history, past surgical history and problem list. Problem list updated.  Objective:   Filed Vitals:   12/25/15 1016  BP: 115/69  Pulse: 77  Weight: 166 lb 8 oz (75.524 kg)    Fetal Status: Fetal Heart Rate (bpm): 145 Fundal Height: 24 cm Movement: Present     General:  Alert, oriented and cooperative. Patient is in no acute distress.  Skin: Skin is warm and dry. No rash noted.   Cardiovascular: Normal heart rate noted  Respiratory: Normal respiratory effort, no problems with respiration noted  Abdomen: Soft, gravid, appropriate for gestational age. Pain/Pressure: Present     Pelvic: Vag. Bleeding: None     Cervical exam deferred        Extremities: Normal range of motion.  Edema: None  Mental Status: Normal mood and affect. Normal behavior. Normal judgment and thought content.   Urinalysis:     Urine results not available at discharge.    Assessment and Plan:  Pregnancy: G3P0020 at 3176w6d  1. Encounter for supervision of normal first pregnancy in second trimester - Discussed normal aches and pains of pregnancy and what to report - Reviewed name of baby Miovanni (don't share with other patients) - Discussed third trimester labs at next visit  Preterm labor symptoms and general obstetric precautions including but not limited to vaginal bleeding, contractions, leaking of  fluid and fetal movement were reviewed in detail with the patient. Please refer to After Visit Summary for other counseling recommendations.  Return in about 2 weeks (around 01/08/2016).   Eino FarberWalidah Kennith GainN Karim, CNM

## 2016-01-04 ENCOUNTER — Ambulatory Visit (HOSPITAL_COMMUNITY)
Admission: RE | Admit: 2016-01-04 | Discharge: 2016-01-04 | Disposition: A | Discharge status: Home / Self Care | Payer: Medicaid Other | Source: Ambulatory Visit | Attending: Certified Nurse Midwife | Admitting: Certified Nurse Midwife

## 2016-01-04 ENCOUNTER — Encounter (HOSPITAL_COMMUNITY): Payer: Self-pay

## 2016-01-04 ENCOUNTER — Other Ambulatory Visit (HOSPITAL_COMMUNITY): Payer: Self-pay | Admitting: Maternal and Fetal Medicine

## 2016-01-04 DIAGNOSIS — O358XX0 Maternal care for other (suspected) fetal abnormality and damage, not applicable or unspecified: Secondary | ICD-10-CM | POA: Insufficient documentation

## 2016-01-04 DIAGNOSIS — O359XX0 Maternal care for (suspected) fetal abnormality and damage, unspecified, not applicable or unspecified: Secondary | ICD-10-CM

## 2016-01-04 DIAGNOSIS — Z3A27 27 weeks gestation of pregnancy: Secondary | ICD-10-CM | POA: Diagnosis not present

## 2016-01-07 ENCOUNTER — Ambulatory Visit (INDEPENDENT_AMBULATORY_CARE_PROVIDER_SITE_OTHER): Payer: Medicaid Other | Admitting: Student

## 2016-01-07 ENCOUNTER — Telehealth: Payer: Self-pay | Admitting: *Deleted

## 2016-01-07 VITALS — BP 115/72 | HR 79 | Wt 168.8 lb

## 2016-01-07 DIAGNOSIS — Z3402 Encounter for supervision of normal first pregnancy, second trimester: Secondary | ICD-10-CM

## 2016-01-07 DIAGNOSIS — Z3482 Encounter for supervision of other normal pregnancy, second trimester: Secondary | ICD-10-CM

## 2016-01-07 LAB — CBC
HEMATOCRIT: 37.8 % (ref 35.0–45.0)
Hemoglobin: 12.6 g/dL (ref 11.7–15.5)
MCH: 31 pg (ref 27.0–33.0)
MCHC: 33.3 g/dL (ref 32.0–36.0)
MCV: 92.9 fL (ref 80.0–100.0)
MPV: 9.8 fL (ref 7.5–12.5)
Platelets: 199 10*3/uL (ref 140–400)
RBC: 4.07 MIL/uL (ref 3.80–5.10)
RDW: 13.2 % (ref 11.0–15.0)
WBC: 8.5 10*3/uL (ref 3.8–10.8)

## 2016-01-07 LAB — POCT URINALYSIS DIP (DEVICE)
BILIRUBIN URINE: NEGATIVE
Glucose, UA: NEGATIVE mg/dL
HGB URINE DIPSTICK: NEGATIVE
KETONES UR: NEGATIVE mg/dL
Leukocytes, UA: NEGATIVE
Nitrite: NEGATIVE
PH: 7.5 (ref 5.0–8.0)
PROTEIN: NEGATIVE mg/dL
Specific Gravity, Urine: 1.015 (ref 1.005–1.030)
Urobilinogen, UA: 0.2 mg/dL (ref 0.0–1.0)

## 2016-01-07 LAB — HIV ANTIBODY (ROUTINE TESTING W REFLEX): HIV: NONREACTIVE

## 2016-01-07 NOTE — Progress Notes (Signed)
Subjective:  Carrie [name] is a 25 y.o. G3P0020 at 6414w5d being seen today for ongoing prenatal care.  She is currently monitored for the following issues for this low-risk pregnancy and has Supervision of normal first pregnancy and Generalized anxiety disorder on her problem list.  Patient reports no complaints.  Contractions: Not present. Vag. Bleeding: None.  Movement: Present. Denies leaking of fluid.   The following portions of the patient's history were reviewed and updated as appropriate: allergies, current medications, past family history, past medical history, past social history, past surgical history and problem list. Problem list updated.  Objective:   Filed Vitals:   01/07/16 1519  BP: 115/72  Pulse: 79  Weight: 168 lb 12.8 oz (76.567 kg)    Fetal Status: Fetal Heart Rate (bpm): 138   Movement: Present     General:  Alert, oriented and cooperative. Patient is in no acute distress.  Skin: Skin is warm and dry. No rash noted.   Cardiovascular: Normal heart rate noted  Respiratory: Normal respiratory effort, no problems with respiration noted  Abdomen: Soft, gravid, appropriate for gestational age. Pain/Pressure: Present     Pelvic: Vag. Bleeding: None     Cervical exam deferred        Extremities: Normal range of motion.  Edema: None  Mental Status: Normal mood and affect. Normal behavior. Normal judgment and thought content.   Urinalysis: Urine Protein: Negative Urine Glucose: Negative  Assessment and Plan:  Pregnancy: G3P0020 at 2214w5d  1. Encounter for supervision of normal first pregnancy in second trimester -Pt undecided about Tdap --- will decide for next visit - Glucose Tolerance, 1 HR (50g) w/o Fasting - CBC - HIV antibody (with reflex) - RPR  Preterm labor symptoms and general obstetric precautions including but not limited to vaginal bleeding, contractions, leaking of fluid and fetal movement were reviewed in detail with the patient. Please refer to After  Visit Summary for other counseling recommendations.  Return in about 2 weeks (around 01/21/2016) for Routine OB.   Judeth HornErin Kentarius [place], NP

## 2016-01-07 NOTE — Patient Instructions (Signed)

## 2016-01-07 NOTE — Progress Notes (Signed)
Breastfeeding tip reviewed 28 week labs today

## 2016-01-08 LAB — RPR

## 2016-01-08 LAB — GLUCOSE TOLERANCE, 1 HOUR (50G) W/O FASTING: Glucose, 1 Hr, gestational: 111 mg/dL (ref <140)

## 2016-01-20 ENCOUNTER — Encounter (HOSPITAL_COMMUNITY): Payer: Self-pay | Admitting: *Deleted

## 2016-01-20 ENCOUNTER — Inpatient Hospital Stay (HOSPITAL_COMMUNITY)
Admission: AD | Admit: 2016-01-20 | Discharge: 2016-01-20 | Disposition: A | Discharge status: Home / Self Care | Payer: Medicaid Other | Source: Ambulatory Visit | Attending: Family Medicine | Admitting: Family Medicine

## 2016-01-20 DIAGNOSIS — Z885 Allergy status to narcotic agent status: Secondary | ICD-10-CM | POA: Insufficient documentation

## 2016-01-20 DIAGNOSIS — Z3A29 29 weeks gestation of pregnancy: Secondary | ICD-10-CM | POA: Insufficient documentation

## 2016-01-20 DIAGNOSIS — O26899 Other specified pregnancy related conditions, unspecified trimester: Secondary | ICD-10-CM

## 2016-01-20 DIAGNOSIS — O9989 Other specified diseases and conditions complicating pregnancy, childbirth and the puerperium: Secondary | ICD-10-CM | POA: Diagnosis not present

## 2016-01-20 DIAGNOSIS — R109 Unspecified abdominal pain: Secondary | ICD-10-CM

## 2016-01-20 DIAGNOSIS — Z3402 Encounter for supervision of normal first pregnancy, second trimester: Secondary | ICD-10-CM

## 2016-01-20 DIAGNOSIS — Z3403 Encounter for supervision of normal first pregnancy, third trimester: Secondary | ICD-10-CM | POA: Diagnosis not present

## 2016-01-20 LAB — URINALYSIS, ROUTINE W REFLEX MICROSCOPIC
Bilirubin Urine: NEGATIVE
GLUCOSE, UA: NEGATIVE mg/dL
Hgb urine dipstick: NEGATIVE
KETONES UR: NEGATIVE mg/dL
LEUKOCYTES UA: NEGATIVE
NITRITE: NEGATIVE
PH: 7 (ref 5.0–8.0)
Protein, ur: NEGATIVE mg/dL
Specific Gravity, Urine: <1.005 — ABNORMAL LOW (ref 1.005–1.030)

## 2016-01-20 NOTE — MAU Note (Signed)
Pt stated she was out shopping today and stared having a sharp pain/period cramping in her lower abd. .She had to sit down for awhile and it went away. Still having them now they come go a few times an hour. Good fetal movement reported

## 2016-01-20 NOTE — MAU Provider Note (Signed)
History   G3P0020 @ 29.4 wks in with c/o of being at mall shopping and started having sharp shooting abd pain. States was concerned so she came in to be evaluated.  CSN: [id number]  Arrival date & time 01/20/16  1815   First Provider Initiated Contact with Patient 01/20/16 1921      Chief Complaint  Patient presents with  . Abdominal Pain    HPI  Past Medical History  Diagnosis Date  . Heart murmur     at birth  . Infection     UTI  . Anxiety     hx of meds    Past Surgical History  Procedure Laterality Date  . No past surgeries      Family History  Problem Relation Age of Onset  . Hypertension Father   . Hyperlipidemia Father   . Diabetes Maternal Grandfather   . Cancer Paternal Grandmother   . Cancer Paternal Grandfather   . Stroke Paternal Grandfather   . Heart disease Paternal Grandfather   . Hearing loss Neg Hx     Social History  Substance Use Topics  . Smoking status: Never Smoker   . Smokeless tobacco: Former NeurosurgeonUser  . Alcohol Use: No    OB History    Gravida Para Term Preterm AB TAB SAB Ectopic Multiple Living   3 0 0 0 2 1 0 1        Review of Systems  Constitutional: Negative.   HENT: Negative.   Eyes: Negative.   Respiratory: Negative.   Cardiovascular: Negative.   Gastrointestinal: Positive for abdominal pain.  Endocrine: Negative.   Genitourinary: Negative.   Musculoskeletal: Negative.   Skin: Negative.   Allergic/Immunologic: Negative.   Neurological: Negative.   Hematological: Negative.   Psychiatric/Behavioral: Negative.     Allergies  Codeine  Home Medications  No current outpatient prescriptions on file.  BP 107/73 mmHg  Pulse 81  Temp(Src) 98.6 F (37 C) (Oral)  Resp 18  LMP 06/27/2015  Physical Exam  Constitutional: She is oriented to person, place, and time. She appears well-developed and well-nourished.  HENT:  Head: Normocephalic.  Eyes: Pupils are equal, round, and reactive to light.  Neck: Normal range  of motion.  Cardiovascular: Normal rate, regular rhythm, normal heart sounds and intact distal pulses.   Pulmonary/Chest: Effort normal and breath sounds normal.  Abdominal: Soft. Bowel sounds are normal.  Genitourinary: Vagina normal and uterus normal.  Musculoskeletal: Normal range of motion.  Neurological: She is alert and oriented to person, place, and time. She has normal reflexes.  Skin: Skin is warm and dry.  Psychiatric: She has a normal mood and affect. Her behavior is normal. Judgment and thought content normal.    MAU Course  Procedures (including critical care time)  Labs Reviewed  URINALYSIS, ROUTINE W REFLEX MICROSCOPIC (NOT AT [facility]) - Abnormal; Notable for the following:    Specific Gravity, Urine <1.005 (*)    All other components within normal limits   No results found.   1. Encounter for supervision of normal first pregnancy in second trimester       MDM  SVE cl/post/th. FHR pattern reassuring, no uc's will d/c home.

## 2016-01-20 NOTE — Discharge Instructions (Signed)
Abdominal Pain, Adult °Many things can cause abdominal pain. Usually, abdominal pain is not caused by a disease and will improve without treatment. It can often be observed and treated at home. Your health care provider will do a physical exam and possibly order blood tests and X-rays to help determine the seriousness of your pain. However, in many cases, more time must pass before a clear cause of the pain can be found. Before that point, your health care provider may not know if you need more testing or further treatment. °HOME CARE INSTRUCTIONS °Monitor your abdominal pain for any changes. The following actions may help to alleviate any discomfort you are experiencing: °· Only take over-the-counter or prescription medicines as directed by your health care provider. °· Do not take laxatives unless directed to do so by your health care provider. °· Try a clear liquid diet (broth, tea, or water) as directed by your health care provider. Slowly move to a bland diet as tolerated. °SEEK MEDICAL CARE IF: °· You have unexplained abdominal pain. °· You have abdominal pain associated with nausea or diarrhea. °· You have pain when you urinate or have a bowel movement. °· You experience abdominal pain that wakes you in the night. °· You have abdominal pain that is worsened or improved by eating food. °· You have abdominal pain that is worsened with eating fatty foods. °· You have a fever. °SEEK IMMEDIATE MEDICAL CARE IF: °· Your pain does not go away within 2 hours. °· You keep throwing up (vomiting). °· Your pain is felt only in portions of the abdomen, such as the right side or the left lower portion of the abdomen. °· You pass bloody or black tarry stools. °MAKE SURE YOU: °· Understand these instructions. °· Will watch your condition. °· Will get help right away if you are not doing well or get worse. °  °This information is not intended to replace advice given to you by your health care provider. Make sure you discuss  any questions you have with your health care provider. °  °Document Released: 05/21/2005 Document Revised: 05/02/2015 Document Reviewed: 04/20/2013 °Elsevier Interactive Patient Education ©2016 [employer] ° °Abdominal Pain, Adult °Many things can cause abdominal pain. Usually, abdominal pain is not caused by a disease and will improve without treatment. It can often be observed and treated at home. Your health care provider will do a physical exam and possibly order blood tests and X-rays to help determine the seriousness of your pain. However, in many cases, more time must pass before a clear cause of the pain can be found. Before that point, your health care provider may not know if you need more testing or further treatment. °HOME CARE INSTRUCTIONS °Monitor your abdominal pain for any changes. The following actions may help to alleviate any discomfort you are experiencing: °· Only take over-the-counter or prescription medicines as directed by your health care provider. °· Do not take laxatives unless directed to do so by your health care provider. °· Try a clear liquid diet (broth, tea, or water) as directed by your health care provider. Slowly move to a bland diet as tolerated. °SEEK MEDICAL CARE IF: °· You have unexplained abdominal pain. °· You have abdominal pain associated with nausea or diarrhea. °· You have pain when you urinate or have a bowel movement. °· You experience abdominal pain that wakes you in the night. °· You have abdominal pain that is worsened or improved by eating food. °· You have abdominal   pain that is worsened with eating fatty foods. °· You have a fever. °SEEK IMMEDIATE MEDICAL CARE IF: °· Your pain does not go away within 2 hours. °· You keep throwing up (vomiting). °· Your pain is felt only in portions of the abdomen, such as the right side or the left lower portion of the abdomen. °· You pass bloody or black tarry stools. °MAKE SURE YOU: °· Understand these instructions. °· Will  watch your condition. °· Will get help right away if you are not doing well or get worse. °  °This information is not intended to replace advice given to you by your health care provider. Make sure you discuss any questions you have with your health care provider. °  °Document Released: 05/21/2005 Document Revised: 05/02/2015 Document Reviewed: 04/20/2013 °Elsevier Interactive Patient Education ©2016 [employer] Inc[employer] ° °

## 2016-01-23 ENCOUNTER — Ambulatory Visit (INDEPENDENT_AMBULATORY_CARE_PROVIDER_SITE_OTHER): Payer: Medicaid Other | Admitting: Certified Nurse Midwife

## 2016-01-23 VITALS — BP 118/73 | HR 91 | Wt 168.8 lb

## 2016-01-23 DIAGNOSIS — Z3403 Encounter for supervision of normal first pregnancy, third trimester: Secondary | ICD-10-CM | POA: Diagnosis present

## 2016-01-23 LAB — POCT URINALYSIS DIP (DEVICE)
Bilirubin Urine: NEGATIVE
Glucose, UA: NEGATIVE mg/dL
Hgb urine dipstick: NEGATIVE
Ketones, ur: NEGATIVE mg/dL
Leukocytes, UA: NEGATIVE
Nitrite: NEGATIVE
Protein, ur: NEGATIVE mg/dL
Specific Gravity, Urine: 1.015 (ref 1.005–1.030)
Urobilinogen, UA: 0.2 mg/dL (ref 0.0–1.0)
pH: 7.5 (ref 5.0–8.0)

## 2016-01-23 NOTE — Patient Instructions (Signed)

## 2016-01-23 NOTE — Progress Notes (Signed)
Subjective:  Carrie [name] is a 25 y.o. G3P0020 at 43107w0d being seen today for ongoing prenatal care.  She is currently monitored for the following issues for this low-risk pregnancy and has Supervision of normal first pregnancy and Generalized anxiety disorder on her problem list.  Patient reports no complaints.  Contractions: Not present.  .  Movement: Present. Denies leaking of fluid.   The following portions of the patient's history were reviewed and updated as appropriate: allergies, current medications, past family history, past medical history, past social history, past surgical history and problem list. Problem list updated.  Objective:   Filed Vitals:   01/23/16 0956  BP: 118/73  Pulse: 91  Weight: 168 lb 12.8 oz (76.567 kg)    Fetal Status: Fetal Heart Rate (bpm): 150   Movement: Present     General:  Alert, oriented and cooperative. Patient is in no acute distress.  Skin: Skin is warm and dry. No rash noted.   Cardiovascular: Normal heart rate noted  Respiratory: Normal respiratory effort, no problems with respiration noted  Abdomen: Soft, gravid, appropriate for gestational age. Pain/Pressure: Present     Pelvic:       Cervical exam deferred        Extremities: Normal range of motion.     Mental Status: Normal mood and affect. Normal behavior. Normal judgment and thought content.   Urinalysis:      Assessment and Plan:  Pregnancy: G3P0020 at 82107w0d  1. Encounter for supervision of normal first pregnancy in third trimester   Preterm labor symptoms and general obstetric precautions including but not limited to vaginal bleeding, contractions, leaking of fluid and fetal movement were reviewed in detail with the patient. Please refer to After Visit Summary for other counseling recommendations.  No Follow-up on file.   Rhea PinkLori A Josclyn Rosales, CNM

## 2016-01-23 NOTE — Telephone Encounter (Signed)
Encounter opened in error

## 2016-02-12 ENCOUNTER — Ambulatory Visit (INDEPENDENT_AMBULATORY_CARE_PROVIDER_SITE_OTHER): Payer: Medicaid Other | Admitting: Family Medicine

## 2016-02-12 VITALS — BP 107/64 | HR 72 | Temp 98.6°F | Wt 172.7 lb

## 2016-02-12 DIAGNOSIS — O99341 Other mental disorders complicating pregnancy, first trimester: Secondary | ICD-10-CM

## 2016-02-12 DIAGNOSIS — F411 Generalized anxiety disorder: Secondary | ICD-10-CM

## 2016-02-12 DIAGNOSIS — Z23 Encounter for immunization: Secondary | ICD-10-CM | POA: Diagnosis not present

## 2016-02-12 DIAGNOSIS — Z3403 Encounter for supervision of normal first pregnancy, third trimester: Secondary | ICD-10-CM | POA: Diagnosis not present

## 2016-02-12 LAB — POCT URINALYSIS DIP (DEVICE)
Bilirubin Urine: NEGATIVE
Glucose, UA: NEGATIVE mg/dL
HGB URINE DIPSTICK: NEGATIVE
Ketones, ur: NEGATIVE mg/dL
Nitrite: NEGATIVE
Protein, ur: NEGATIVE mg/dL
SPECIFIC GRAVITY, URINE: 1.02 (ref 1.005–1.030)
UROBILINOGEN UA: 0.2 mg/dL (ref 0.0–1.0)
pH: 7 (ref 5.0–8.0)

## 2016-02-12 MED ORDER — TETANUS-DIPHTH-ACELL PERTUSSIS 5-2.5-18.5 LF-MCG/0.5 IM SUSP
0.5000 mL | Freq: Once | INTRAMUSCULAR | Status: AC
  Administered 2016-02-12: 0.5 mL via INTRAMUSCULAR

## 2016-02-12 NOTE — Patient Instructions (Signed)

## 2016-02-12 NOTE — Progress Notes (Signed)
Subjective:  Carrie [name] is a 25 y.o. G3P0020 at 8336w6d being seen today for ongoing prenatal care.  She is currently monitored for the following issues for this low-risk pregnancy and has Supervision of normal first pregnancy and Generalized anxiety disorder on her problem list.  Patient reports no complaints.  Contractions: Irregular. Vag. Bleeding: None.  Movement: Present. Denies leaking of fluid.   The following portions of the patient's history were reviewed and updated as appropriate: allergies, current medications, past family history, past medical history, past social history, past surgical history and problem list. Problem list updated.  Objective:   Filed Vitals:   02/12/16 1118  BP: 107/64  Pulse: 72  Temp: 98.6 F (37 C)  Weight: 172 lb 11.2 oz (78.336 kg)    Fetal Status: Fetal Heart Rate (bpm): 128 Fundal Height: 32 cm Movement: Present     General:  Alert, oriented and cooperative. Patient is in no acute distress.  Skin: Skin is warm and dry. No rash noted.   Cardiovascular: Normal heart rate noted  Respiratory: Normal respiratory effort, no problems with respiration noted  Abdomen: Soft, gravid, appropriate for gestational age. Pain/Pressure: Absent     Pelvic: Cervical exam deferred        Extremities: Normal range of motion.  Edema: None  Mental Status: Normal mood and affect. Normal behavior. Normal judgment and thought content.   Urinalysis: Urine Protein: Negative Urine Glucose: Negative  Assessment and Plan:  Pregnancy: G3P0020 at 3836w6d  1. Encounter for supervision of normal first pregnancy in third trimester - Updated box, discussed POP - Tdap (BOOSTRIX) injection 0.5 mL; Inject 0.5 mLs into the muscle once.  2. Generalized anxiety disorder Discussed increased risk of PP depression/anxiety  Preterm labor symptoms and general obstetric precautions including but not limited to vaginal bleeding, contractions, leaking of fluid and fetal movement were  reviewed in detail with the patient. Please refer to After Visit Summary for other counseling recommendations.  Return in about 2 weeks (around 02/26/2016) for Routine prenatal care.   Federico FlakeKimberly Niles Isaac Dubie, MD

## 2016-02-12 NOTE — Progress Notes (Signed)
C/o irregular contractions.

## 2016-03-03 ENCOUNTER — Ambulatory Visit (INDEPENDENT_AMBULATORY_CARE_PROVIDER_SITE_OTHER): Payer: Medicaid Other | Admitting: Obstetrics and Gynecology

## 2016-03-03 VITALS — BP 119/74 | HR 78 | Wt 173.2 lb

## 2016-03-03 DIAGNOSIS — Z3403 Encounter for supervision of normal first pregnancy, third trimester: Secondary | ICD-10-CM | POA: Diagnosis not present

## 2016-03-03 DIAGNOSIS — F411 Generalized anxiety disorder: Secondary | ICD-10-CM

## 2016-03-03 LAB — POCT URINALYSIS DIP (DEVICE)
Bilirubin Urine: NEGATIVE
GLUCOSE, UA: NEGATIVE mg/dL
Hgb urine dipstick: NEGATIVE
Ketones, ur: NEGATIVE mg/dL
LEUKOCYTES UA: NEGATIVE
Nitrite: NEGATIVE
PROTEIN: NEGATIVE mg/dL
Specific Gravity, Urine: <=1.005 (ref 1.005–1.030)
UROBILINOGEN UA: 0.2 mg/dL (ref 0.0–1.0)
pH: 6.5 (ref 5.0–8.0)

## 2016-03-03 NOTE — Addendum Note (Signed)
Addended by: Cheree DittoGRAHAM, DEMETRICE A on: 03/03/2016 10:23 AM   Modules accepted: Orders

## 2016-03-03 NOTE — Progress Notes (Signed)
Subjective:  Carrie [name] is a 25 y.o. G3P0020 at 651w5d being seen today for ongoing prenatal care.  She is currently monitored for the following issues for this low-risk pregnancy and has Supervision of normal first pregnancy and Generalized anxiety disorder on her problem list.  Patient reports no complaints.   Contractions: Irregular.  .  Movement: Present. Denies leaking of fluid.   The following portions of the patient's history were reviewed and updated as appropriate: allergies, current medications, past family history, past medical history, past social history, past surgical history and problem list. Problem list updated.  Objective:   Filed Vitals:   03/03/16 0754  BP: 119/74  Pulse: 78  Weight: 173 lb 3.2 oz (78.563 kg)    Fetal Status: Fetal Heart Rate (bpm): 126   Movement: Present     General:  Alert, oriented and cooperative. Patient is in no acute distress.  Skin: Skin is warm and dry. No rash noted.   Cardiovascular: Normal heart rate noted  Respiratory: Normal respiratory effort, no problems with respiration noted  Abdomen: Soft, gravid, appropriate for gestational age. Pain/Pressure: Present     Pelvic:  Cervical exam deferred        Extremities: Normal range of motion.  Edema: None  Mental Status: Normal mood and affect. Normal behavior. Normal judgment and thought content.   Urinalysis:      Assessment and Plan:  Pregnancy: G3P0020 at 431w5d  1. Encounter for supervision of normal first pregnancy in third trimester No issues. pills  2. Generalized anxiety disorder No issues  Preterm labor symptoms and general obstetric precautions including but not limited to vaginal bleeding, contractions, leaking of fluid and fetal movement were reviewed in detail with the patient. Please refer to After Visit Summary for other counseling recommendations.  Return in about 10 days (around 03/13/2016).   Boswell Bingharlie Joshau Code, MD

## 2016-03-03 NOTE — Progress Notes (Signed)
Having a lot of discharge

## 2016-03-11 ENCOUNTER — Ambulatory Visit (INDEPENDENT_AMBULATORY_CARE_PROVIDER_SITE_OTHER): Payer: Medicaid Other | Admitting: Advanced Practice Midwife

## 2016-03-11 ENCOUNTER — Encounter: Payer: Self-pay | Admitting: Clinical

## 2016-03-11 ENCOUNTER — Other Ambulatory Visit (HOSPITAL_COMMUNITY)
Admission: RE | Admit: 2016-03-11 | Discharge: 2016-03-11 | Disposition: A | Discharge status: Home / Self Care | Payer: Medicaid Other | Source: Ambulatory Visit | Attending: Advanced Practice Midwife | Admitting: Advanced Practice Midwife

## 2016-03-11 ENCOUNTER — Encounter: Payer: Self-pay | Admitting: Advanced Practice Midwife

## 2016-03-11 VITALS — BP 105/71 | HR 90 | Wt 175.2 lb

## 2016-03-11 DIAGNOSIS — Z113 Encounter for screening for infections with a predominantly sexual mode of transmission: Secondary | ICD-10-CM | POA: Insufficient documentation

## 2016-03-11 DIAGNOSIS — Z3403 Encounter for supervision of normal first pregnancy, third trimester: Secondary | ICD-10-CM

## 2016-03-11 LAB — POCT URINALYSIS DIP (DEVICE)
BILIRUBIN URINE: NEGATIVE
GLUCOSE, UA: NEGATIVE mg/dL
KETONES UR: NEGATIVE mg/dL
Nitrite: NEGATIVE
Protein, ur: NEGATIVE mg/dL
SPECIFIC GRAVITY, URINE: 1.02 (ref 1.005–1.030)
Urobilinogen, UA: 0.2 mg/dL (ref 0.0–1.0)
pH: 6.5 (ref 5.0–8.0)

## 2016-03-11 LAB — OB RESULTS CONSOLE GC/CHLAMYDIA: Gonorrhea: NEGATIVE

## 2016-03-11 LAB — OB RESULTS CONSOLE GBS: STREP GROUP B AG: NEGATIVE

## 2016-03-11 NOTE — Patient Instructions (Signed)

## 2016-03-11 NOTE — Progress Notes (Signed)
Subjective:  Carrie [name] is a 25 y.o. G3P0020 at 7069w6d being seen today for ongoing prenatal care.  She is currently monitored for the following issues for this low-risk pregnancy and has Supervision of normal first pregnancy and Generalized anxiety disorder on her problem list.  Patient reports intermittent sharp pelvic pain, vaginal discharge.  Contractions: Not present. Vag. Bleeding: None.  Movement: Present. Denies leaking of fluid.   The following portions of the patient's history were reviewed and updated as appropriate: allergies, current medications, past family history, past medical history, past social history, past surgical history and problem list. Problem list updated.  Objective:   Filed Vitals:   03/11/16 0916  BP: 105/71  Pulse: 90  Weight: 175 lb 3.2 oz (79.47 kg)    Fetal Status: Fetal Heart Rate (bpm): 140   Movement: Present     General:  Alert, oriented and cooperative. Patient is in no acute distress.  Skin: Skin is warm and dry. No rash noted.   Cardiovascular: Normal heart rate noted  Respiratory: Normal respiratory effort, no problems with respiration noted  Abdomen: Soft, gravid, appropriate for gestational age. Pain/Pressure: Present     Pelvic:  Cervical exam performed        Extremities: Normal range of motion.  Edema: Trace  Mental Status: Normal mood and affect. Normal behavior. Normal judgment and thought content.   Urinalysis: Urine Protein: Negative Urine Glucose: Negative  Assessment and Plan:  Pregnancy: G3P0020 at 1769w6d  1. Supervision of normal first pregnancy in third trimester      Sterile speculum exam, negative for ROM - Culture, beta strep (group b only) - GC[facility]Chlamydia [facility]  Term labor symptoms and general obstetric precautions including but not limited to vaginal bleeding, contractions, leaking of fluid and fetal movement were reviewed in detail with the patient. Please refer to After Visit Summary for  other counseling recommendations.  Return in about 1 week (around 03/18/2016) for [facility].   Aviva SignsMarie L Kaytlynn Kochan, CNM

## 2016-03-12 LAB — GC/CHLAMYDIA PROBE AMP (~~LOC~~) NOT AT ARMC
CHLAMYDIA, DNA PROBE: NEGATIVE
Neisseria Gonorrhea: NEGATIVE

## 2016-03-13 LAB — CULTURE, BETA STREP (GROUP B ONLY)

## 2016-03-16 ENCOUNTER — Inpatient Hospital Stay (HOSPITAL_COMMUNITY)
Admission: AD | Admit: 2016-03-16 | Discharge: 2016-03-17 | Disposition: A | Discharge status: Home / Self Care | Payer: Medicaid Other | Source: Ambulatory Visit | Attending: Obstetrics and Gynecology | Admitting: Obstetrics and Gynecology

## 2016-03-16 ENCOUNTER — Encounter (HOSPITAL_COMMUNITY): Payer: Self-pay | Admitting: *Deleted

## 2016-03-16 DIAGNOSIS — Z3483 Encounter for supervision of other normal pregnancy, third trimester: Secondary | ICD-10-CM | POA: Insufficient documentation

## 2016-03-16 DIAGNOSIS — Z3403 Encounter for supervision of normal first pregnancy, third trimester: Secondary | ICD-10-CM

## 2016-03-16 DIAGNOSIS — Z3A00 Weeks of gestation of pregnancy not specified: Secondary | ICD-10-CM | POA: Diagnosis not present

## 2016-03-16 NOTE — MAU Note (Signed)
Pt c/o contractions since yesterday-regular. Denies LOF or vag bleeding. Cervix closed on last exam. +FM.

## 2016-03-17 DIAGNOSIS — Z3483 Encounter for supervision of other normal pregnancy, third trimester: Secondary | ICD-10-CM | POA: Diagnosis not present

## 2016-03-17 LAB — AMNISURE RUPTURE OF MEMBRANE (ROM) NOT AT ARMC: Amnisure ROM: NEGATIVE

## 2016-03-18 ENCOUNTER — Ambulatory Visit (INDEPENDENT_AMBULATORY_CARE_PROVIDER_SITE_OTHER): Payer: Medicaid Other | Admitting: Student

## 2016-03-18 VITALS — BP 110/69 | HR 67 | Wt 175.0 lb

## 2016-03-18 DIAGNOSIS — Z3403 Encounter for supervision of normal first pregnancy, third trimester: Secondary | ICD-10-CM

## 2016-03-18 LAB — POCT URINALYSIS DIP (DEVICE)
Bilirubin Urine: NEGATIVE
GLUCOSE, UA: NEGATIVE mg/dL
Hgb urine dipstick: NEGATIVE
KETONES UR: NEGATIVE mg/dL
Nitrite: NEGATIVE
Protein, ur: NEGATIVE mg/dL
SPECIFIC GRAVITY, URINE: 1.02 (ref 1.005–1.030)
Urobilinogen, UA: 0.2 mg/dL (ref 0.0–1.0)
pH: 7 (ref 5.0–8.0)

## 2016-03-18 NOTE — Patient Instructions (Signed)
Braxton Hicks Contractions °Contractions of the uterus can occur throughout pregnancy. Contractions are not always a sign that you are in labor.  °WHAT ARE BRAXTON HICKS CONTRACTIONS?  °Contractions that occur before labor are called Braxton Hicks contractions, or false labor. Toward the end of pregnancy (32-34 weeks), these contractions can develop more often and may become more forceful. This is not true labor because these contractions do not result in opening (dilatation) and thinning of the cervix. They are sometimes difficult to tell apart from true labor because these contractions can be forceful and people have different pain tolerances. You should not feel embarrassed if you go to the hospital with false labor. Sometimes, the only way to tell if you are in true labor is for your health care provider to look for changes in the cervix. °If there are no prenatal problems or other health problems associated with the pregnancy, it is completely safe to be sent home with false labor and await the onset of true labor. °HOW CAN YOU TELL THE DIFFERENCE BETWEEN TRUE AND FALSE LABOR? °False Labor °· The contractions of false labor are usually shorter and not as hard as those of true labor.   °· The contractions are usually irregular.   °· The contractions are often felt in the front of the lower abdomen and in the groin.   °· The contractions may go away when you walk around or change positions while lying down.   °· The contractions get weaker and are shorter lasting as time goes on.   °· The contractions do not usually become progressively stronger, regular, and closer together as with true labor.   °True Labor °· Contractions in true labor last 30-70 seconds, become very regular, usually become more intense, and increase in frequency.   °· The contractions do not go away with walking.   °· The discomfort is usually felt in the top of the uterus and spreads to the lower abdomen and low back.   °· True labor can be  determined by your health care provider with an exam. This will show that the cervix is dilating and getting thinner.   °WHAT TO REMEMBER °· Keep up with your usual exercises and follow other instructions given by your health care provider.   °· Take medicines as directed by your health care provider.   °· Keep your regular prenatal appointments.   °· Eat and drink lightly if you think you are going into labor.   °· If Braxton Hicks contractions are making you uncomfortable:   °¨ Change your position from lying down or resting to walking, or from walking to resting.   °¨ Sit and rest in a tub of warm water.   °¨ Drink 2-3 glasses of water. Dehydration may cause these contractions.   °¨ Do slow and deep breathing several times an hour.   °WHEN SHOULD I SEEK IMMEDIATE MEDICAL CARE? °Seek immediate medical care if: °· Your contractions become stronger, more regular, and closer together.   °· You have fluid leaking or gushing from your vagina.   °· You have a fever.   °· You pass blood-tinged mucus.   °· You have vaginal bleeding.   °· You have continuous abdominal pain.   °· You have low back pain that you never had before.   °· You feel your baby's head pushing down and causing pelvic pressure.   °· Your baby is not moving as much as it used to.   °  °This information is not intended to replace advice given to you by your health care provider. Make sure you discuss any questions you have with your health care   provider. °  °Document Released: 08/11/2005 Document Revised: 08/16/2013 Document Reviewed: 05/23/2013 °Elsevier Interactive Patient Education ©2016 [employer] Inc. ° °

## 2016-03-18 NOTE — Progress Notes (Signed)
Patient ID: Carrie [name], female   DOB: 02-18-1991, 25 y.o.   MRN: [id number] Subjective:  Carrie [name] is a 25 y.o. G3P0020 at [redacted]w[redacted]d being seen today for ongoing prenatal care.  She is currently monitored for the following issues for this low-risk pregnancy and has Supervision of normal first pregnancy and Generalized anxiety disorder on her problem list.  Patient reports occasional contractions.  Contractions: Irregular. Vag. Bleeding: None.  Movement: Present. Denies leaking of fluid.   The following portions of the patient's history were reviewed and updated as appropriate: allergies, current medications, past family history, past medical history, past social history, past surgical history and problem list. Problem list updated.  Objective:   Vitals:   03/18/16 0928  BP: 110/69  Pulse: 67  Weight: 175 lb (79.4 kg)    Fetal Status: Fetal Heart Rate (bpm): 124 Fundal Height: 37 cm Movement: Present  Presentation: Vertex  General:  Alert, oriented and cooperative. Patient is in no acute distress.  Skin: Skin is warm and dry. No rash noted.   Cardiovascular: Normal heart rate noted  Respiratory: Normal respiratory effort, no problems with respiration noted  Abdomen: Soft, gravid, appropriate for gestational age. Pain/Pressure: Present     Pelvic:  Cervical exam performed Dilation: Fingertip Effacement (%): 50 Station: -3  Extremities: Normal range of motion.     Mental Status: Normal mood and affect. Normal behavior. Normal judgment and thought content.   Urinalysis:      Assessment and Plan:  Pregnancy: G3P0020 at [redacted]w[redacted]d  1. Encounter for supervision of normal first pregnancy in third trimester   Term labor symptoms and general obstetric precautions including but not limited to vaginal bleeding, contractions, leaking of fluid and fetal movement were reviewed in detail with the patient. Please refer to After Visit Summary for other counseling recommendations.  Return in about 1  week (around 03/25/2016) for Routine OB.   [name], NP

## 2016-03-24 ENCOUNTER — Ambulatory Visit (INDEPENDENT_AMBULATORY_CARE_PROVIDER_SITE_OTHER): Payer: Medicaid Other | Admitting: Family Medicine

## 2016-03-24 VITALS — BP 107/71 | HR 77 | Wt 179.2 lb

## 2016-03-24 DIAGNOSIS — Z3403 Encounter for supervision of normal first pregnancy, third trimester: Secondary | ICD-10-CM

## 2016-03-24 LAB — POCT URINALYSIS DIP (DEVICE)
Bilirubin Urine: NEGATIVE
GLUCOSE, UA: NEGATIVE mg/dL
HGB URINE DIPSTICK: NEGATIVE
KETONES UR: NEGATIVE mg/dL
Nitrite: NEGATIVE
Protein, ur: 30 mg/dL — AB
SPECIFIC GRAVITY, URINE: 1.025 (ref 1.005–1.030)
UROBILINOGEN UA: 0.2 mg/dL (ref 0.0–1.0)
pH: 6.5 (ref 5.0–8.0)

## 2016-03-24 NOTE — Patient Instructions (Signed)

## 2016-03-24 NOTE — Progress Notes (Signed)
Subjective:  Carrie [name] is a 25 y.o. G3P0020 at [redacted]w[redacted]d being seen today for ongoing prenatal care.  She is currently monitored for the following issues for this low-risk pregnancy and has Supervision of normal first pregnancy and Generalized anxiety disorder on her problem list.  Patient reports vaginal pressure, irregular contractions especially at night..  Contractions: Regular, at night. Vag. Bleeding: None.  Movement: Present. Denies leaking of fluid.   The following portions of the patient's history were reviewed and updated as appropriate: allergies, current medications, past family history, past medical history, past social history, past surgical history and problem list. Problem list updated.  Objective:   Vitals:   03/24/16 1421  BP: 107/71  Pulse: 77  Weight: 179 lb 3.2 oz (81.3 kg)    Fetal Status: Fetal Heart Rate (bpm): 136   Movement: Present     General:  Alert, oriented and cooperative. Patient is in no acute distress.  Skin: Skin is warm and dry. No rash noted.   Cardiovascular: Normal heart rate noted  Respiratory: Normal respiratory effort, no problems with respiration noted  Abdomen: Soft, gravid, appropriate for gestational age. Pain/Pressure: Present     Pelvic:  Cervical exam performed       SVE: 0.5/50/-3  Extremities: Normal range of motion.     Mental Status: Normal mood and affect. Normal behavior. Normal judgment and thought content.   Urinalysis:      Assessment and Plan:  Pregnancy: G3P0020 at [redacted]w[redacted]d  1. Encounter for supervision of normal first pregnancy in third trimester - Reviewed the first [place] again, images show a female gender, reports says female.  - Labs UTD - Labor precautions given - Kick counts given - Obstetrical precautions given.    There are no diagnoses linked to this encounter. Term labor symptoms and general obstetric precautions including but not limited to vaginal bleeding, contractions, leaking of fluid and fetal movement were  reviewed in detail with the patient. Please refer to After Visit Summary for other counseling recommendations.  Return in about 1 week (around 03/31/2016) for Routine OB visit.   [facility] [place], [place]

## 2016-03-27 ENCOUNTER — Encounter (HOSPITAL_COMMUNITY): Payer: Self-pay | Admitting: *Deleted

## 2016-03-27 ENCOUNTER — Inpatient Hospital Stay (HOSPITAL_COMMUNITY)
Admission: AD | Admit: 2016-03-27 | Discharge: 2016-03-27 | Disposition: A | Discharge status: Home / Self Care | Payer: Medicaid Other | Source: Ambulatory Visit | Attending: Obstetrics & Gynecology | Admitting: Obstetrics & Gynecology

## 2016-03-27 DIAGNOSIS — R1010 Upper abdominal pain, unspecified: Secondary | ICD-10-CM | POA: Diagnosis present

## 2016-03-27 DIAGNOSIS — R109 Unspecified abdominal pain: Secondary | ICD-10-CM | POA: Diagnosis not present

## 2016-03-27 DIAGNOSIS — O471 False labor at or after 37 completed weeks of gestation: Secondary | ICD-10-CM | POA: Insufficient documentation

## 2016-03-27 DIAGNOSIS — O479 False labor, unspecified: Secondary | ICD-10-CM

## 2016-03-27 DIAGNOSIS — O26893 Other specified pregnancy related conditions, third trimester: Secondary | ICD-10-CM | POA: Diagnosis not present

## 2016-03-27 DIAGNOSIS — Z3A39 39 weeks gestation of pregnancy: Secondary | ICD-10-CM | POA: Diagnosis not present

## 2016-03-27 DIAGNOSIS — Z3403 Encounter for supervision of normal first pregnancy, third trimester: Secondary | ICD-10-CM

## 2016-03-27 DIAGNOSIS — O9989 Other specified diseases and conditions complicating pregnancy, childbirth and the puerperium: Secondary | ICD-10-CM | POA: Diagnosis not present

## 2016-03-27 DIAGNOSIS — O26899 Other specified pregnancy related conditions, unspecified trimester: Secondary | ICD-10-CM

## 2016-03-27 LAB — URINALYSIS, ROUTINE W REFLEX MICROSCOPIC
BILIRUBIN URINE: NEGATIVE
Glucose, UA: NEGATIVE mg/dL
Hgb urine dipstick: NEGATIVE
Ketones, ur: 15 mg/dL — AB
Leukocytes, UA: NEGATIVE
NITRITE: NEGATIVE
Protein, ur: NEGATIVE mg/dL
SPECIFIC GRAVITY, URINE: 1.02 (ref 1.005–1.030)
pH: 6 (ref 5.0–8.0)

## 2016-03-27 NOTE — MAU Provider Note (Addendum)
History     CSN: [id number]  Arrival date and time: 03/27/16 1414   First Provider Initiated Contact with Patient 03/27/16 1512      Chief Complaint  Patient presents with  . Abdominal Pain   HPI 25 y.o. G3P0020 at [redacted]w[redacted]d who presents today with upper abdominal pain that occurred during exercising on an elliptical machine today. No associated nausea, vomiting, fevers, or other concerns.  No CP or SOB.  She was very worried, and wanted to be evaluated. Pain is subsiding by time of encounter.   Obstetric History   G3   P0   T0   P0   A2   L0    SAB1   TAB0   Ectopic1   Multiple0   Live Births0     # Outcome Date GA Lbr Len/2nd Weight Sex Delivery Anes PTL Lv  3 Current           2 Ectopic           1 TAB               Past Medical History:  Diagnosis Date  . Anxiety    hx of meds  . Heart murmur    at birth  . Infection    UTI    Past Surgical History:  Procedure Laterality Date  . NO PAST SURGERIES      Family History  Problem Relation Age of Onset  . Hypertension Father   . Hyperlipidemia Father   . Diabetes Maternal Grandfather   . Cancer Paternal Grandmother   . Cancer Paternal Grandfather   . Stroke Paternal Grandfather   . Heart disease Paternal Grandfather   . Hearing loss Neg Hx     Social History  Substance Use Topics  . Smoking status: Never Smoker  . Smokeless tobacco: Former Neurosurgeon  . Alcohol use No    Allergies:  Allergies  Allergen Reactions  . Codeine Nausea Only    Raises body temperature    Prescriptions Prior to Admission  Medication Sig Dispense Refill Last Dose  . Prenatal Vit-Fe Fumarate-FA (PRENATAL VITAMINS) 28-0.8 MG TABS Take 1 tablet by mouth daily. 30 tablet 5 03/27/2016 at Unknown time    Review of Systems  All other systems reviewed and are negative.  Physical Exam   Blood pressure 108/70, pulse 83, temperature 97.8 F (36.6 C), temperature source Oral, resp. rate 24, height  (1.651 m), weight 179 lb 3 oz (81.3  kg), last menstrual period 06/27/2015, SpO2 97 %, unknown if currently breastfeeding.  Physical Exam  Constitutional: She is oriented to person, place, and time. She appears well-developed and well-nourished.  HENT:  Head: Normocephalic and atraumatic.  Eyes: EOM are normal. Pupils are equal, round, and reactive to light.  Neck: Normal range of motion. Neck supple.  Cardiovascular: Normal rate.   Respiratory: Effort normal and breath sounds normal.  GI: Soft. She exhibits no distension and no mass. There is no tenderness. There is no rebound and no guarding.  Genitourinary:  Genitourinary Comments: Cervix 1/50/high  Musculoskeletal: Normal range of motion.  Neurological: She is alert and oriented to person, place, and time.  Skin: Skin is warm and dry.  Psychiatric: She has a normal mood and affect. Her behavior is normal.    MAU Course  Procedures  MDM Normal EKG Reactive NST with baseline in 120s Occasional contractions Cervix: Dilation: 1 Effacement (%): 50 Station:  (high) Exam by:: Naomie Crow   Assessment and Plan  1. Abdominal pain in pregnancy, antepartum   2. Encounter for supervision of normal first pregnancy in third trimester   3. Irregular uterine contractions    Pain is likely musculoskeletal; offered Flexeril as she declined. No cardiovascular concerns; normal vitals and EKG Recommended Tylenol as needed Labor and fetal movement precautions reviewed. Continue scheduled prenatal care.   Tereso Newcomer, MD 03/27/2016, 3:25 PM

## 2016-03-27 NOTE — Discharge Instructions (Signed)
You can take TUMS as needed for heartburn  Tylenol as needed for pain  Braxton Hicks Contractions Contractions of the uterus can occur throughout pregnancy. Contractions are not always a sign that you are in labor.  WHAT ARE BRAXTON HICKS CONTRACTIONS?  Contractions that occur before labor are called Braxton Hicks contractions, or false labor. Toward the end of pregnancy (32-34 weeks), these contractions can develop more often and may become more forceful. This is not true labor because these contractions do not result in opening (dilatation) and thinning of the cervix. They are sometimes difficult to tell apart from true labor because these contractions can be forceful and people have different pain tolerances. You should not feel embarrassed if you go to the hospital with false labor. Sometimes, the only way to tell if you are in true labor is for your health care provider to look for changes in the cervix. If there are no prenatal problems or other health problems associated with the pregnancy, it is completely safe to be sent home with false labor and await the onset of true labor. HOW CAN YOU TELL THE DIFFERENCE BETWEEN TRUE AND FALSE LABOR? False Labor  The contractions of false labor are usually shorter and not as hard as those of true labor.   The contractions are usually irregular.   The contractions are often felt in the front of the lower abdomen and in the groin.   The contractions may go away when you walk around or change positions while lying down.   The contractions get weaker and are shorter lasting as time goes on.   The contractions do not usually become progressively stronger, regular, and closer together as with true labor.  True Labor  Contractions in true labor last 30-70 seconds, become very regular, usually become more intense, and increase in frequency.   The contractions do not go away with walking.   The discomfort is usually felt in the top of the  uterus and spreads to the lower abdomen and low back.   True labor can be determined by your health care provider with an exam. This will show that the cervix is dilating and getting thinner.  WHAT TO REMEMBER  Keep up with your usual exercises and follow other instructions given by your health care provider.   Take medicines as directed by your health care provider.   Keep your regular prenatal appointments.   Eat and drink lightly if you think you are going into labor.   If Braxton Hicks contractions are making you uncomfortable:   Change your position from lying down or resting to walking, or from walking to resting.   Sit and rest in a tub of warm water.   Drink 2-3 glasses of water. Dehydration may cause these contractions.   Do slow and deep breathing several times an hour.  WHEN SHOULD I SEEK IMMEDIATE MEDICAL CARE? Seek immediate medical care if:  Your contractions become stronger, more regular, and closer together.   You have fluid leaking or gushing from your vagina.   You have a fever.   You pass blood-tinged mucus.   You have vaginal bleeding.   You have continuous abdominal pain.   You have low back pain that you never had before.   You feel your baby's head pushing down and causing pelvic pressure.   Your baby is not moving as much as it used to.    This information is not intended to replace advice given to you by your  health care provider. Make sure you discuss any questions you have with your health care provider.   Document Released: 08/11/2005 Document Revised: 08/16/2013 Document Reviewed: 05/23/2013 Elsevier Interactive Patient Education [employer]

## 2016-03-27 NOTE — MAU Note (Addendum)
Patient presents at [redacted] weeks gestation with c/o upper abdominal pain X 20 minutes that makes breathing difficult. States she was at the gym completing her cardio workout on the elliptical when it first started and has gradually gotten worse. Fetus active. Denies bleeding or discharge.

## 2016-04-02 ENCOUNTER — Telehealth: Payer: Self-pay | Admitting: *Deleted

## 2016-04-02 ENCOUNTER — Ambulatory Visit (INDEPENDENT_AMBULATORY_CARE_PROVIDER_SITE_OTHER): Payer: Medicaid Other | Admitting: Family Medicine

## 2016-04-02 VITALS — BP 111/78 | HR 78 | Wt 182.0 lb

## 2016-04-02 DIAGNOSIS — Z3403 Encounter for supervision of normal first pregnancy, third trimester: Secondary | ICD-10-CM | POA: Diagnosis not present

## 2016-04-02 DIAGNOSIS — F411 Generalized anxiety disorder: Secondary | ICD-10-CM | POA: Diagnosis not present

## 2016-04-02 LAB — POCT URINALYSIS DIP (DEVICE)
Bilirubin Urine: NEGATIVE
GLUCOSE, UA: NEGATIVE mg/dL
Hgb urine dipstick: NEGATIVE
Ketones, ur: NEGATIVE mg/dL
Nitrite: NEGATIVE
Protein, ur: NEGATIVE mg/dL
SPECIFIC GRAVITY, URINE: 1.025 (ref 1.005–1.030)
UROBILINOGEN UA: 0.2 mg/dL (ref 0.0–1.0)
pH: 6 (ref 5.0–8.0)

## 2016-04-02 NOTE — Progress Notes (Signed)
Subjective:  Carrie [name] is a 25 y.o. G3P0020 at 164w0d being seen today for ongoing prenatal care.  She is currently monitored for the following issues for this low-risk pregnancy and has Supervision of normal first pregnancy and Generalized anxiety disorder on her problem list.  Patient reports occasional contractions.  Contractions: Irregular. Vag. Bleeding: None.  Movement: Present. Denies leaking of fluid but increased watery discharge that causes her to change her pad 3x/day, no gushes.   The following portions of the patient's history were reviewed and updated as appropriate: allergies, current medications, past family history, past medical history, past social history, past surgical history and problem list. Problem list updated.  Objective:   Vitals:   04/02/16 0851  BP: 111/78  Pulse: 78  Weight: 182 lb (82.6 kg)    Fetal Status: Fetal Heart Rate (bpm): 122   Movement: Present     General:  Alert, oriented and cooperative. Patient is in no acute distress.  Skin: Skin is warm and dry. No rash noted.   Cardiovascular: Normal heart rate noted  Respiratory: Normal respiratory effort, no problems with respiration noted  Abdomen: Soft, gravid, appropriate for gestational age. Pain/Pressure: Present     Pelvic:  Cervical exam performed       fingertip/thick/high; vertex  Extremities: Normal range of motion.  Edema: Trace  Mental Status: Normal mood and affect. Normal behavior. Normal judgment and thought content.   Urinalysis: Urine Protein: Negative Urine Glucose: Negative  Assessment and Plan:  Pregnancy: G3P0020 at 804w0d  1. Encounter for supervision of normal first pregnancy in third trimester - Induction of labor for postdates scheduled for 1 week - NST within 1 week  2. Generalized anxiety disorder - controlled, but having anxiety about pain. - Reviewed pain medications during labor.    Term labor symptoms and general obstetric precautions including but not limited  to vaginal bleeding, contractions, leaking of fluid and fetal movement were reviewed in detail with the patient. Please refer to After Visit Summary for other counseling recommendations.  Return in about 4 days (around 04/06/2016) for NST 3-5 days; postpartum visit in 6-7 weeks.   [facility] LLCElizabeth Woodland VassarMumaw, [place]DO

## 2016-04-02 NOTE — Telephone Encounter (Signed)
Carrie [name] called and left a message she was in this am , had a pelvic check, and is having more than spotting- wants to know if should go to mau.   I called Carrie [name] and having contractions every 5-7 minutes and brownish discharge on pad.  I instructed her to come to MaU if contractions get to every 5 minutes or discharge/ bleeding increases. She vocies understanding.

## 2016-04-02 NOTE — Patient Instructions (Signed)

## 2016-04-04 ENCOUNTER — Telehealth (HOSPITAL_COMMUNITY): Payer: Self-pay | Admitting: *Deleted

## 2016-04-04 ENCOUNTER — Encounter (HOSPITAL_COMMUNITY): Payer: Self-pay | Admitting: *Deleted

## 2016-04-04 NOTE — Telephone Encounter (Signed)
Preadmission screen  

## 2016-04-07 ENCOUNTER — Ambulatory Visit (HOSPITAL_COMMUNITY)
Admission: RE | Admit: 2016-04-07 | Discharge: 2016-04-07 | Disposition: A | Discharge status: Home / Self Care | Payer: Medicaid Other | Source: Ambulatory Visit | Attending: Obstetrics and Gynecology | Admitting: Obstetrics and Gynecology

## 2016-04-07 ENCOUNTER — Ambulatory Visit (INDEPENDENT_AMBULATORY_CARE_PROVIDER_SITE_OTHER): Payer: Medicaid Other | Admitting: *Deleted

## 2016-04-07 ENCOUNTER — Other Ambulatory Visit: Payer: Self-pay | Admitting: Obstetrics and Gynecology

## 2016-04-07 VITALS — BP 103/73 | HR 77 | Wt 185.0 lb

## 2016-04-07 DIAGNOSIS — O480 Post-term pregnancy: Secondary | ICD-10-CM | POA: Insufficient documentation

## 2016-04-07 DIAGNOSIS — Z3A40 40 weeks gestation of pregnancy: Secondary | ICD-10-CM | POA: Insufficient documentation

## 2016-04-09 ENCOUNTER — Inpatient Hospital Stay (HOSPITAL_COMMUNITY): Payer: Medicaid Other | Admitting: Anesthesiology

## 2016-04-09 ENCOUNTER — Inpatient Hospital Stay (HOSPITAL_COMMUNITY)
Admission: RE | Admit: 2016-04-09 | Discharge: 2016-04-11 | DRG: 775 | Disposition: A | Discharge status: Home / Self Care | Payer: Medicaid Other | Source: Ambulatory Visit | Attending: Family Medicine | Admitting: Family Medicine

## 2016-04-09 ENCOUNTER — Encounter (HOSPITAL_COMMUNITY): Payer: Self-pay

## 2016-04-09 VITALS — BP 102/75 | HR 72 | Temp 98.6°F | Resp 18 | Ht 65.0 in | Wt 185.0 lb

## 2016-04-09 DIAGNOSIS — O9962 Diseases of the digestive system complicating childbirth: Secondary | ICD-10-CM | POA: Diagnosis present

## 2016-04-09 DIAGNOSIS — O99344 Other mental disorders complicating childbirth: Secondary | ICD-10-CM | POA: Diagnosis present

## 2016-04-09 DIAGNOSIS — Z823 Family history of stroke: Secondary | ICD-10-CM

## 2016-04-09 DIAGNOSIS — Z8249 Family history of ischemic heart disease and other diseases of the circulatory system: Secondary | ICD-10-CM

## 2016-04-09 DIAGNOSIS — Z833 Family history of diabetes mellitus: Secondary | ICD-10-CM | POA: Diagnosis not present

## 2016-04-09 DIAGNOSIS — K219 Gastro-esophageal reflux disease without esophagitis: Secondary | ICD-10-CM | POA: Diagnosis present

## 2016-04-09 DIAGNOSIS — O480 Post-term pregnancy: Principal | ICD-10-CM | POA: Diagnosis present

## 2016-04-09 DIAGNOSIS — Z3A41 41 weeks gestation of pregnancy: Secondary | ICD-10-CM

## 2016-04-09 DIAGNOSIS — F411 Generalized anxiety disorder: Secondary | ICD-10-CM | POA: Diagnosis present

## 2016-04-09 DIAGNOSIS — Z3403 Encounter for supervision of normal first pregnancy, third trimester: Secondary | ICD-10-CM

## 2016-04-09 LAB — CBC
HEMATOCRIT: 37.8 % (ref 36.0–46.0)
Hemoglobin: 13.5 g/dL (ref 12.0–15.0)
MCH: 31.5 pg (ref 26.0–34.0)
MCHC: 35.7 g/dL (ref 30.0–36.0)
MCV: 88.3 fL (ref 78.0–100.0)
Platelets: 197 10*3/uL (ref 150–400)
RBC: 4.28 MIL/uL (ref 3.87–5.11)
RDW: 13 % (ref 11.5–15.5)
WBC: 8.4 10*3/uL (ref 4.0–10.5)

## 2016-04-09 LAB — TYPE AND SCREEN
ABO/RH(D): O POS
Antibody Screen: NEGATIVE

## 2016-04-09 LAB — RPR: RPR: NONREACTIVE

## 2016-04-09 MED ORDER — DIPHENHYDRAMINE HCL 50 MG/ML IJ SOLN: 12.5000 mg | INTRAMUSCULAR | Status: DC | PRN

## 2016-04-09 MED ORDER — FENTANYL 2.5 MCG/ML BUPIVACAINE 1/10 % EPIDURAL INFUSION (WH - ANES)
14.0000 mL/h | INTRAMUSCULAR | Status: DC | PRN
  Administered 2016-04-09 (×2): 14 mL/h via EPIDURAL
  Filled 2016-04-09 (×2): qty 125

## 2016-04-09 MED ORDER — LACTATED RINGERS IV SOLN
INTRAVENOUS | Status: DC
  Administered 2016-04-09 – 2016-04-10 (×5): via INTRAVENOUS

## 2016-04-09 MED ORDER — LIDOCAINE HCL (PF) 1 % IJ SOLN
INTRAMUSCULAR | Status: DC | PRN
  Administered 2016-04-09: 5 mL via EPIDURAL
  Administered 2016-04-09: 7 mL via EPIDURAL

## 2016-04-09 MED ORDER — LIDOCAINE HCL (PF) 1 % IJ SOLN
30.0000 mL | INTRAMUSCULAR | Status: DC | PRN
  Filled 2016-04-09: qty 30

## 2016-04-09 MED ORDER — OXYTOCIN BOLUS FROM INFUSION
500.0000 mL | Freq: Once | INTRAVENOUS | Status: AC
  Administered 2016-04-10: 500 mL via INTRAVENOUS

## 2016-04-09 MED ORDER — ONDANSETRON HCL 4 MG/2ML IJ SOLN
4.0000 mg | Freq: Four times a day (QID) | INTRAMUSCULAR | Status: DC | PRN
  Administered 2016-04-09: 4 mg via INTRAVENOUS
  Filled 2016-04-09: qty 2

## 2016-04-09 MED ORDER — LACTATED RINGERS IV SOLN
500.0000 mL | Freq: Once | INTRAVENOUS | Status: AC
  Administered 2016-04-09: 500 mL via INTRAVENOUS

## 2016-04-09 MED ORDER — OXYTOCIN 40 UNITS IN LACTATED RINGERS INFUSION - SIMPLE MED
2.5000 [IU]/h | INTRAVENOUS | Status: DC
  Administered 2016-04-10: 2.5 [IU]/h via INTRAVENOUS
  Filled 2016-04-09: qty 1000

## 2016-04-09 MED ORDER — PHENYLEPHRINE 40 MCG/ML (10ML) SYRINGE FOR IV PUSH (FOR BLOOD PRESSURE SUPPORT)
80.0000 ug | PREFILLED_SYRINGE | INTRAVENOUS | Status: DC | PRN
  Filled 2016-04-09: qty 5
  Filled 2016-04-09: qty 10

## 2016-04-09 MED ORDER — LACTATED RINGERS IV SOLN: 500.0000 mL | INTRAVENOUS | Status: DC | PRN

## 2016-04-09 MED ORDER — FLEET ENEMA 7-19 GM/118ML RE ENEM: 1.0000 | ENEMA | RECTAL | Status: DC | PRN

## 2016-04-09 MED ORDER — EPHEDRINE 5 MG/ML INJ
10.0000 mg | INTRAVENOUS | Status: DC | PRN
  Filled 2016-04-09: qty 4

## 2016-04-09 MED ORDER — MISOPROSTOL 25 MCG QUARTER TABLET
25.0000 ug | ORAL_TABLET | ORAL | Status: DC | PRN
  Administered 2016-04-09 (×2): 25 ug via VAGINAL
  Filled 2016-04-09: qty 0.25
  Filled 2016-04-09: qty 1

## 2016-04-09 MED ORDER — ACETAMINOPHEN 325 MG PO TABS: 650.0000 mg | ORAL_TABLET | ORAL | Status: DC | PRN

## 2016-04-09 MED ORDER — TERBUTALINE SULFATE 1 MG/ML IJ SOLN
0.2500 mg | Freq: Once | INTRAMUSCULAR | Status: DC | PRN
  Filled 2016-04-09: qty 1

## 2016-04-09 MED ORDER — PHENYLEPHRINE 40 MCG/ML (10ML) SYRINGE FOR IV PUSH (FOR BLOOD PRESSURE SUPPORT)
80.0000 ug | PREFILLED_SYRINGE | INTRAVENOUS | Status: DC | PRN
  Filled 2016-04-09: qty 5

## 2016-04-09 MED ORDER — FENTANYL CITRATE (PF) 100 MCG/2ML IJ SOLN
100.0000 ug | INTRAMUSCULAR | Status: DC | PRN
  Administered 2016-04-09: 100 ug via INTRAVENOUS
  Filled 2016-04-09: qty 2

## 2016-04-09 MED ORDER — LACTATED RINGERS IV SOLN: 500.0000 mL | Freq: Once | INTRAVENOUS | Status: DC

## 2016-04-09 MED ORDER — SOD CITRATE-CITRIC ACID 500-334 MG/5ML PO SOLN: 30.0000 mL | ORAL | Status: DC | PRN

## 2016-04-09 NOTE — Progress Notes (Signed)
Patient ID: Carrie [name], female   DOB: 11-02-1990, 25 y.o.   MRN: [id number] Doing well, not hurting much, but some increase in pain  Foley out FHR reactive, category I UCs every 2-3 min  Dilation: 4.5 Effacement (%): 50 Cervical Position: Middle Station: -2 Presentation: Vertex Exam by:: foley,rn  Will continue to observe

## 2016-04-09 NOTE — Progress Notes (Signed)
Patient ID: Scarlette ShortsKayla M Simm, female   DOB: 1991-06-02, 25 y.o.   MRN: [id number] Scarlette ShortsKayla M Carp is a 25 y.o. G3P0020 at 55101w0d.  Subjective: Increased cramping.   Objective: BP 108/70   Pulse 67   Temp 98.2 F (36.8 C) (Oral)   Resp 18   Ht 5\' 5"  (1.651 m)   Wt 185 lb (83.9 kg)   LMP 06/27/2015   BMI 30.79 kg/m    FHT:  FHR: 120 bpm, variability: 15x15,  accelerations:  none,  decelerations:  None UC:   Q 2-5 minutes, mild-mod Dilation: 1 Effacement (%): 50 Cervical Position: Middle Station: -3 Presentation: Vertex Exam by:: Orvan SeenStacy D. CNM student   Foley bulb placed by CNM student w/out difficulty. Placement verified by CNM. Cytotec placed.   Labs: NA  Assessment / Plan: 46101w0d week IUP Labor: IOL Fetal Wellbeing:  Category I Pain Control:  Requesting Fentanyl Anticipated MOD:  SVD  Dorathy KinsmanVirginia Connelly Spruell, CNM 04/09/2016 9:22 AM

## 2016-04-09 NOTE — Anesthesia Pain Management Evaluation Note (Signed)
  CRNA Pain Management Visit Note  Patient: Carrie [name], 25 y.o., female  "Hello [facility] We have an anesthesia team available at all times to provide care throughout the hospital, including epidural management and anesthesia for C-section. I don't know your plan for the delivery whether it a natural birth, water birth, IV sedation, nitrous supplementation, doula or epidural, but we want to meet your pain goals."   1.Was your pain managed to your expectations on prior hospitalizations?   No prior hospitalizations  2.What is your expectation for pain management during this hospitalization?     Epidural and IV pain meds  3.How can we help you reach that goal? IV pain meds, epidural.  Record the patient's initial score and the patient's pain goal.   Pain: 5  Pain Goal: 5 [facility] wants you to be able to say your pain was always managed very well.  Elvert Cumpton L 04/09/2016

## 2016-04-09 NOTE — Anesthesia Preprocedure Evaluation (Signed)
Anesthesia Evaluation  Patient identified by MRN, date of birth, ID band Patient awake    Reviewed: Allergy & Precautions, NPO status , Patient's Chart, lab work & pertinent test results  History of Anesthesia Complications Negative for: history of anesthetic complications  Airway Mallampati: II  TM Distance: >3 FB Neck ROM: Full    Dental  (+) Teeth Intact, Dental Advisory Given   Pulmonary neg pulmonary ROS,    Pulmonary exam normal breath sounds clear to auscultation       Cardiovascular (-) angina+ Valvular Problems/Murmurs (heart murmur at birth)  Rhythm:Regular Rate:Normal     Neuro/Psych PSYCHIATRIC DISORDERS Anxiety negative neurological ROS     GI/Hepatic Neg liver ROS, GERD  ,  Endo/Other  negative endocrine ROS  Renal/GU negative Renal ROS     Musculoskeletal   Abdominal   Peds  Hematology negative hematology ROS (+)   Anesthesia Other Findings Hgb 13.5 Platelets 197  Reproductive/Obstetrics (+) Pregnancy                             Anesthesia Physical Anesthesia Plan  ASA: II  Anesthesia Plan: Epidural   Post-op Pain Management:    Induction:   Airway Management Planned:   Additional Equipment:   Intra-op Plan:   Post-operative Plan:   Informed Consent: I have reviewed the patients History and Physical, chart, labs and discussed the procedure including the risks, benefits and alternatives for the proposed anesthesia with the patient or authorized representative who has indicated his/her understanding and acceptance.   Dental advisory given  Plan Discussed with:   Anesthesia Plan Comments: (I have discussed risks of neuraxial anesthesia including but not limited to infection, bleeding, nerve injury, back pain, headache, seizures, and failure of block. Patient denies bleeding disorders and is not currently anticoagulated. Labs have been reviewed. Risks and  benefits discussed. All patient's questions answered.  )        Anesthesia Quick Evaluation

## 2016-04-09 NOTE — Progress Notes (Signed)
LABOR PROGRESS NOTE  Carrie [name] is a 25 y.o. G3P0020 at 3641[name]  admitted for IOL  Subjective: Seen with Dr. Genevie AnnSchenk at bedside.  Patient doing well, no complaints. Tolerated AROM well, is comfortable and states ready for epidural.  Objective: BP 116/70   Pulse 84   Temp 98.6 F (37 C) (Oral)   Resp 18   Ht 5\' 5"  (1.651 m)   Wt 83.9 kg (185 lb)   LMP 06/27/2015   BMI 30.79 kg/m  or  Vitals:   04/09/16 0657 04/09/16 0730 04/09/16 0927 04/09/16 1129  BP: 108/70   116/70  Pulse: 67   84  Resp: 18 18 18 18   Temp: 98.2 F (36.8 C)  97.9 F (36.6 C) 98.6 F (37 C)  TempSrc: Oral  Oral Oral  Weight:      Height:        FHT: 120 bpm, mod variability, + accelerations, no decelerations Ctx: q3-4 minutes, mild-mod Dilation: 6 Effacement (%): 80 Cervical Position: Middle Station: -1 Presentation: Vertex Exam by:: dr Lenford Beddow  Labs: Lab Results  Component Value Date   WBC 8.4 04/09/2016   HGB 13.5 04/09/2016   HCT 37.8 04/09/2016   MCV 88.3 04/09/2016   PLT 197 04/09/2016    Patient Active Problem List   Diagnosis Date Noted  . Post-dates pregnancy 04/09/2016  . Generalized anxiety disorder 09/11/2015  . Supervision of normal first pregnancy 09/04/2015    Assessment / Plan: 25 y.o. G3P0020 at 6667w0d here for IOL, now AROM  Labor: IOL, S/P AROM Fetal Wellbeing:  Cat I Pain Control:  Ready for epidural Anticipated MOD:  SVD  Leland HerElsia J Yoo, MD 04/09/2016, 1:00 PM  OB FELLOW LABOR PROGRESS ATTESTATION  I was gloved and present for the delivery in its entirety, and I agree with the above resident's note.    Ernestina PennaNicholas Alesia Oshields, MD 1:56 PM

## 2016-04-09 NOTE — Progress Notes (Signed)
LABOR PROGRESS NOTE  Carrie [name] is a 25 y.o. G3P0020 at 8175w0d  admitted for IOL  Subjective: Patient doing well, feels better with epidural. No complaints.  Objective: BP 99/64   Pulse 84   Temp 98.5 F (36.9 C) (Oral)   Resp 18   Ht 5\' 5"  (1.651 m)   Wt 83.9 kg (185 lb)   LMP 06/27/2015   SpO2 (!) 86%   BMI 30.79 kg/m  or  Vitals:   04/09/16 1500 04/09/16 1530 04/09/16 1600 04/09/16 1630  BP: (!) 98/55 99/64    Pulse: 77 84    Resp: 20  20 18   Temp: 98.5 F (36.9 C)     TempSrc: Oral     SpO2:      Weight:      Height:        FHT: 120 bpm, mod variability, + accelerations, no decelerations Uterine activity: q2-433min Dilation: 6 Effacement (%): 80 Cervical Position: Middle Station: -2 Presentation: Vertex Exam by:: foley,rn  Labs: Lab Results  Component Value Date   WBC 8.4 04/09/2016   HGB 13.5 04/09/2016   HCT 37.8 04/09/2016   MCV 88.3 04/09/2016   PLT 197 04/09/2016    Patient Active Problem List   Diagnosis Date Noted  . Post-dates pregnancy 04/09/2016  . Generalized anxiety disorder 09/11/2015  . Supervision of normal first pregnancy 09/04/2015    Assessment / Plan: 25 y.o. G3P0020 at 475w0d here for IOL  Labor: IOL s/p AROM Fetal Wellbeing:  Category I Pain Control:  epidural Anticipated MOD:  SVD  Leland HerElsia J Yoo, DO  PGY-1, [facility] 8/16/20174:46 PM  OB FELLOW LABOR PROGRESS NOTE ATTESTATION  I have seen and examined this patient and agree with above documentation in the resident's note.   Ernestina PennaNicholas Nikelle Malatesta, MD 6:38 PM

## 2016-04-09 NOTE — Progress Notes (Signed)
Carrie [name] is a 25 y.o. G3P0020 at 2933w0d Subjective: Pt comfortable with epidural, waiting for pressure and urge to push  Objective: BP 112/67   Pulse 93   Temp 98.3 F (36.8 C) (Oral)   Resp 17   Ht 5\' 5"  (1.651 m)   Wt 83.9 kg (185 lb)   LMP 06/27/2015   SpO2 (!) 86%   BMI 30.79 kg/m  No intake/output data recorded. No intake/output data recorded.  FHT:  FHR: 130 bpm, variability: moderate,  accelerations:  Present,  decelerations:  Present Occasional variable UC:   regular, every 2-4 minutes SVE:   Dilation: 10 Effacement (%): 100 Station: +1 Exam by:: E. Johnson,RN   Labs: Lab Results  Component Value Date   WBC 8.4 04/09/2016   HGB 13.5 04/09/2016   HCT 37.8 04/09/2016   MCV 88.3 04/09/2016   PLT 197 04/09/2016    Assessment / Plan: Spontaneous labor, progressing normally  Labor: Progressing normally Fetal Wellbeing:  Category II Pain Control:  Epidural I/D:  n/a Anticipated MOD:  NSVD  Labor down, awaiting pressure and urge to push  Carrie [name] 04/09/2016, 9:14 PM  I have seen and examined this patient and I agree with the above. Cam HaiSHAW, Chrsitopher Wik CNM 3:55 AM 04/10/2016

## 2016-04-09 NOTE — Anesthesia Procedure Notes (Signed)
Epidural Patient location during procedure: OB Start time: 04/09/2016 1:30 PM End time: 04/09/2016 1:40 PM  Staffing Anesthesiologist: Linton RumpALLAN, Jeffree Cazeau DICKERSON Performed: anesthesiologist   Preanesthetic Checklist Completed: patient identified, site marked, surgical consent, pre-op evaluation, timeout performed, IV checked, risks and benefits discussed and monitors and equipment checked  Epidural Patient position: sitting Prep: site prepped and draped and DuraPrep Patient monitoring: continuous pulse ox and blood pressure Approach: midline Location: L3-L4 Injection technique: LOR air  Needle:  Needle type: Tuohy  Needle gauge: 17 G Needle length: 9 cm and 9 Needle insertion depth: 7 cm Catheter type: closed end flexible Catheter size: 19 Gauge Catheter at skin depth: 12 cm Test dose: negative  Assessment Events: blood not aspirated, injection not painful, no injection resistance, negative IV test and no paresthesia  Additional Notes Reason for block:procedure for pain

## 2016-04-09 NOTE — H&P (Signed)
Carrie [name] is a 25 y.o. female presenting for PD IOL  Mother Gaylyn RongKris; Father of patient very involved with pregnancy (first grand daughter) Clinic  [facility] Prenatal Labs  Dating  LMP Blood type:   O pos  Genetic Screen 1 Screen: normal     AFP: Antibody: Neg  Anatomic US  female [X]  f/u to complete anatomy Rubella:  Immune  GTT Early:               Third trimester:  RPR:   NR  Flu vaccine  declines HBsAg:   Neg  TDaP vaccine   02/12/2016                                        HIV: Non Reactive (11/30 1027)   Baby Food  breast                                        GBS: NEGATIVE  Contraception  POP Pap: 06/2014 normal  Circumcision  n/a, female, "Miavanna"   Pediatrician  Given list   Support Person  mother Gaylyn Rong- Kris, FOB is involved    OB History    Gravida Para Term Preterm AB Living   3 0 0 0 2     SAB TAB Ectopic Multiple Live Births   0 1 1         Past Medical History:  Diagnosis Date  . Anxiety    hx of meds  . Heart murmur    at birth  . Infection    UTI   Past Surgical History:  Procedure Laterality Date  . NO PAST SURGERIES     Family History: family history includes Cancer in her paternal grandfather and paternal grandmother; Diabetes in her maternal grandfather; Heart disease in her paternal grandfather; Hyperlipidemia in her father; Hypertension in her father; Stroke in her paternal grandfather. Social History:  reports that she has never smoked. She has quit using smokeless tobacco. She reports that she does not drink alcohol or use drugs.     Maternal Diabetes: No Genetic Screening: Normal Maternal Ultrasounds/Referrals: Normal Fetal Ultrasounds or other Referrals:  None Maternal Substance Abuse:  No Significant Maternal Medications:  None Significant Maternal Lab Results:  None Other Comments:  None  Review of Systems  Constitutional: Negative.   HENT: Negative.   Eyes: Negative.   Respiratory: Negative.   Cardiovascular: Negative.   Gastrointestinal:  Negative.   Genitourinary: Negative.   Musculoskeletal: Negative.   Skin: Negative.   Neurological: Negative.   Endo/Heme/Allergies: Negative.   Psychiatric/Behavioral: Negative.    History Dilation: 1 Effacement (%): 50 Station: -3 Exam by:: E. Laural BenesJohnson, RN  Temperature 97.8 F (36.6 C), temperature source Oral, height 5\' 5"  (1.651 m), weight 83.9 kg (185 lb), last menstrual period 06/27/2015, unknown if currently breastfeeding. Maternal Exam:  Uterine Assessment: Contraction strength is mild.  Contraction frequency is irregular.   Abdomen: Patient reports no abdominal tenderness. Fetal presentation: vertex  Introitus: not evaluated.   Ferning test: not done.  Nitrazine test: not done.  Cervix: Cervix evaluated by digital exam.     Fetal Exam Fetal Monitor Review: Mode: ultrasound.   Baseline rate: 140.  Variability: moderate (6-25 bpm).   Pattern: accelerations present and no decelerations.    Fetal State Assessment: Category  I - tracings are normal.     Physical Exam  Constitutional: She is oriented to person, place, and time. She appears well-developed and well-nourished.  HENT:  Head: Normocephalic.  Eyes: Conjunctivae and EOM are normal. Pupils are equal, round, and reactive to light.  Neck: Normal range of motion.  Cardiovascular: Normal rate, regular rhythm and normal heart sounds.   Respiratory: Effort normal and breath sounds normal.  GI: Soft. Bowel sounds are normal.  Genitourinary:  Genitourinary Comments: Checked by RN  Musculoskeletal: Normal range of motion.  Neurological: She is alert and oriented to person, place, and time. She has normal reflexes.  Skin: Skin is warm and dry.  Psychiatric: She has a normal mood and affect.    Prenatal labs: ABO, Rh: --/--/O POS (08/16 0115) Antibody: NEG (08/16 0115) Rubella: 17.50 (01/10 1434) RPR: NON REAC (05/15 1530)  HBsAg: NEGATIVE (01/10 1434)  HIV: NONREACTIVE (05/15 1530)  GBS: Negative (07/18  0000)   Assessment/Plan: IUP 41 weeks PD IOL Cytotec 25mcg vaginal Q 4 PRN Possible foley bulb  [name] 04/09/2016, 3:54 AM  I was present for the exam and agree with above. Cervical exam performed by RN.   [place], [place] 04/09/2016 5:37 AM

## 2016-04-10 ENCOUNTER — Encounter (HOSPITAL_COMMUNITY): Payer: Self-pay

## 2016-04-10 DIAGNOSIS — Z3A41 41 weeks gestation of pregnancy: Secondary | ICD-10-CM

## 2016-04-10 DIAGNOSIS — O480 Post-term pregnancy: Secondary | ICD-10-CM

## 2016-04-10 LAB — CBC
HCT: 34.9 % — ABNORMAL LOW (ref 36.0–46.0)
Hemoglobin: 11.7 g/dL — ABNORMAL LOW (ref 12.0–15.0)
MCH: 30.7 pg (ref 26.0–34.0)
MCHC: 33.5 g/dL (ref 30.0–36.0)
MCV: 91.6 fL (ref 78.0–100.0)
PLATELETS: 149 10*3/uL — AB (ref 150–400)
RBC: 3.81 MIL/uL — AB (ref 3.87–5.11)
RDW: 13.3 % (ref 11.5–15.5)
WBC: 13 10*3/uL — AB (ref 4.0–10.5)

## 2016-04-10 MED ORDER — SIMETHICONE 80 MG PO CHEW: 80.0000 mg | CHEWABLE_TABLET | ORAL | Status: DC | PRN

## 2016-04-10 MED ORDER — DIPHENHYDRAMINE HCL 25 MG PO CAPS: 25.0000 mg | ORAL_CAPSULE | Freq: Four times a day (QID) | ORAL | Status: DC | PRN

## 2016-04-10 MED ORDER — SENNOSIDES-DOCUSATE SODIUM 8.6-50 MG PO TABS
2.0000 | ORAL_TABLET | ORAL | Status: DC
  Administered 2016-04-11: 2 via ORAL
  Filled 2016-04-10: qty 2

## 2016-04-10 MED ORDER — ONDANSETRON HCL 4 MG/2ML IJ SOLN: 4.0000 mg | INTRAMUSCULAR | Status: DC | PRN

## 2016-04-10 MED ORDER — IBUPROFEN 600 MG PO TABS
600.0000 mg | ORAL_TABLET | Freq: Four times a day (QID) | ORAL | Status: DC
  Administered 2016-04-10 – 2016-04-11 (×6): 600 mg via ORAL
  Filled 2016-04-10 (×6): qty 1

## 2016-04-10 MED ORDER — WITCH HAZEL-GLYCERIN EX PADS: 1.0000 "application " | MEDICATED_PAD | CUTANEOUS | Status: DC | PRN

## 2016-04-10 MED ORDER — BENZOCAINE-MENTHOL 20-0.5 % EX AERO
1.0000 "application " | INHALATION_SPRAY | CUTANEOUS | Status: DC | PRN
  Administered 2016-04-10: 1 via TOPICAL
  Filled 2016-04-10: qty 56

## 2016-04-10 MED ORDER — DIBUCAINE 1 % RE OINT: 1.0000 "application " | TOPICAL_OINTMENT | RECTAL | Status: DC | PRN

## 2016-04-10 MED ORDER — COCONUT OIL OIL: 1.0000 "application " | TOPICAL_OIL | Status: DC | PRN

## 2016-04-10 MED ORDER — ZOLPIDEM TARTRATE 5 MG PO TABS: 5.0000 mg | ORAL_TABLET | Freq: Every evening | ORAL | Status: DC | PRN

## 2016-04-10 MED ORDER — TRAMADOL HCL 50 MG PO TABS
50.0000 mg | ORAL_TABLET | Freq: Four times a day (QID) | ORAL | Status: DC | PRN
  Administered 2016-04-10: 50 mg via ORAL
  Filled 2016-04-10: qty 1

## 2016-04-10 MED ORDER — TETANUS-DIPHTH-ACELL PERTUSSIS 5-2.5-18.5 LF-MCG/0.5 IM SUSP: 0.5000 mL | Freq: Once | INTRAMUSCULAR | Status: DC

## 2016-04-10 MED ORDER — PRENATAL MULTIVITAMIN CH
1.0000 | ORAL_TABLET | Freq: Every day | ORAL | Status: DC
  Administered 2016-04-10 – 2016-04-11 (×2): 1 via ORAL
  Filled 2016-04-10 (×2): qty 1

## 2016-04-10 MED ORDER — ACETAMINOPHEN 325 MG PO TABS
650.0000 mg | ORAL_TABLET | ORAL | Status: DC | PRN
  Administered 2016-04-10: 650 mg via ORAL
  Filled 2016-04-10: qty 2

## 2016-04-10 MED ORDER — ONDANSETRON HCL 4 MG PO TABS: 4.0000 mg | ORAL_TABLET | ORAL | Status: DC | PRN

## 2016-04-10 NOTE — Anesthesia Postprocedure Evaluation (Signed)
Anesthesia Post Note  Patient: Carrie [name]  Procedure(s) Performed: * No procedures listed *  Patient location during evaluation: Mother Baby Anesthesia Type: Epidural Level of consciousness: awake and alert and oriented Pain management: satisfactory to patient Vital Signs Assessment: post-procedure vital signs reviewed and stable Respiratory status: spontaneous breathing and nonlabored ventilation Cardiovascular status: stable Postop Assessment: no headache, no backache, no signs of nausea or vomiting, adequate PO intake and patient able to bend at knees (patient up walking) Anesthetic complications: no     Last Vitals:  Vitals:   04/10/16 0630 04/10/16 1027  BP: 104/64 101/65  Pulse: 82 66  Resp: 20 20  Temp: 37 C 36.8 C    Last Pain:  Vitals:   04/10/16 1027  TempSrc: Oral  PainSc:    Pain Goal: Patients Stated Pain Goal: 7 (04/09/16 0100)               Madison HickmanGREGORY,Lelia Jons

## 2016-04-10 NOTE — Lactation Note (Signed)
This note was copied from a baby's chart. Lactation Consultation Note New mom holding baby bouncing baby. Mom stated she just finished BF and baby has hiccups. Baby showing cues, sucking on fingers. Discussed cues. Mom seem to be rushing LC out. Mom states she sees colostrum and is doing STS. Educated on newborn behavior, I&O, cluster feeding, supply and demand. Mom encouraged to feed baby 8-12 times/24 hours and with feeding cues. Referred to Baby and Me Book in Breastfeeding section Pg. 22-23 for position options and Proper latch demonstration. Mom had limited eye contact w/LC. WH/LC brochure given w/resources, support groups and LC services. Patient Name: Carrie [name] Reason for consult: Initial assessment   Maternal Data Does the patient have breastfeeding experience prior to this delivery?: No  Feeding Feeding Type: Breast Fed Length of feed: 15 min  LATCH Score/Interventions Latch: Grasps breast easily, tongue down, lips flanged, rhythmical sucking.  Audible Swallowing: Spontaneous and intermittent Intervention(s): Skin to skin;Hand expression  Type of Nipple: Everted at rest and after stimulation  Comfort (Breast/Nipple): Soft / non-tender     Hold (Positioning): Assistance needed to correctly position infant at breast and maintain latch. Intervention(s): Breastfeeding basics reviewed;Support Pillows;Position options;Skin to skin  LATCH Score: 9  Lactation Tools Discussed/Used     Consult Status Consult Status: Follow-up Date: 04/11/16 Follow-up type: In-patient    Charyl DancerCARVER, Izzabelle Bouley G [name], 5:57 AM

## 2016-04-11 LAB — CBC
HEMATOCRIT: 36.6 % (ref 36.0–46.0)
Hemoglobin: 12.2 g/dL (ref 12.0–15.0)
MCH: 30.6 pg (ref 26.0–34.0)
MCHC: 33.3 g/dL (ref 30.0–36.0)
MCV: 91.7 fL (ref 78.0–100.0)
PLATELETS: 156 10*3/uL (ref 150–400)
RBC: 3.99 MIL/uL (ref 3.87–5.11)
RDW: 13.2 % (ref 11.5–15.5)
WBC: 11.4 10*3/uL — AB (ref 4.0–10.5)

## 2016-04-11 MED ORDER — SENNOSIDES-DOCUSATE SODIUM 8.6-50 MG PO TABS: 2.0000 | ORAL_TABLET | Freq: Every evening | ORAL | 0 refills | Status: DC | PRN

## 2016-04-11 NOTE — Lactation Note (Signed)
This note was copied from a baby's chart. Lactation Consultation Note  Patient Name: Carrie [name]: 04/11/2016 Reason for consult: Follow-up assessment;Hyperbilirubinemia Mom reports baby nursing well. MD has recommended supplementing with feedings due to jaundice, bili at 9.1 at 28 hours which is in the 95th percentile. Baby has been to breast 11 times in past 24 hours for 10-15 minutes 1 breast each feeding. 4 voids 3 stools in past 24 hours, last stool 1650 on 04/10/16. Discussed ways to supplement and parents agree to try 5 fr feeding tube at breast. Mom latched baby in cross cradle but allowing baby to self attach and baby not obtaining good depth. Work with Mom to latch baby to obtain good depth with initial latch and sustain good depth with the feeding, demonstrated how to bring bottom lip down for depth. Demonstrated 5 fr feeding tube at breast and also using feeding tube to finger feed. Baby took 5 ml. Cleaning of this system reviewed with Mom.  Advised Mom to BF with feeding ques, at least 8-12 times in 24 hours, Advised to keep baby nursing for 15-20 minutes both breasts each feeding when possible. If supplementing at breast, start the feeding on 1st breast without the supplement, after baby BF for 15-20 minutes, switch to 2nd breast and give supplement while baby nurses on 2nd breast. Alternate this pattern each feeding. Mom to post pump for 15 minutes after each feeding to encourage milk production and to have EBM to supplement.  If using bottle to supplement advised Dr. Manson PasseyBrown #1. [facility]. Mom reports if [facility] she will purchase a pump or use hand pump. BM storage guidelines discussed, refer to Baby N Me booklet page 25. Engorgement care reviewed and advised to monitor voids/stools, refer to Baby N Me booklet page 24. Encouraged to call for questions/concerns.   Maternal Data    Feeding Feeding Type:  Breast Fed Length of feed: 15 min (off/on)  LATCH Score/Interventions Latch: Grasps breast easily, tongue down, lips flanged, rhythmical sucking.  Audible Swallowing: A few with stimulation  Type of Nipple: Everted at rest and after stimulation  Comfort (Breast/Nipple): Soft / non-tender     Hold (Positioning): Assistance needed to correctly position infant at breast and maintain latch. Intervention(s): Breastfeeding basics reviewed;Support Pillows;Position options;Skin to skin  LATCH Score: 8  Lactation Tools Discussed/Used Tools: Pump;66F feeding tube / Syringe Breast pump type: Manual   Consult Status Consult Status: Complete [name]: 04/11/16 Follow-up type: In-patient    Carrie [name], Carrie [name] 04/11/2016, 9:39 AM

## 2016-04-11 NOTE — Discharge Instructions (Signed)

## 2016-04-11 NOTE — Discharge Summary (Signed)
OB Discharge Summary     Patient Name: Carrie [name] DOB: 1991/08/19 MRN: [id number]  Date of admission: 04/09/2016 Delivering MD: Lynda RainwaterIETTE, STACIE ANN   Date of discharge: 04/11/2016  Admitting diagnosis: INDUCTION Intrauterine pregnancy: 4828w1d     Secondary diagnosis:  Active Problems:   Post-dates pregnancy  Additional problems: R labial swelling     Discharge diagnosis: Term Pregnancy Delivered        Postdates pregnancy delivered.                                                                                        Post partum procedures:none  Augmentation: AROM, Cytotec and Foley Balloon  Complications: None  Hospital course:  Induction of Labor With Vaginal Delivery   25 y.o. yo [id number] at 3528w1d was admitted to the hospital 04/09/2016 for induction of labor.  Indication for induction: Postdates.  Patient had an uncomplicated labor course as follows: Membrane Rupture Time/Date: 12:56 PM ,04/09/2016   Intrapartum Procedures: Episiotomy: None [1]                                         Lacerations:  2nd degree [3];Vaginal [6]  Patient had delivery of a Viable infant.  Information for the patient's newborn:  Carrie [name], Carrie [name] [[id number]][[id number]]  Delivery Method: Vaginal, Spontaneous Delivery (Filed from Delivery Summary)   04/10/2016  Details of delivery can be found in separate delivery note.  Patient had a routine postpartum course. Patient is discharged home 04/11/16.   Physical exam Vitals:   04/10/16 0630 04/10/16 1027 04/10/16 1750 04/11/16 0605  BP: 104/64 101/65 110/72 102/75  Pulse: 82 66 (!) 59 72  Resp: 20 20 18 18   Temp: 98.6 F (37 C) 98.3 F (36.8 C) 98 F (36.7 C) 98.6 F (37 C)  TempSrc: Oral Oral Oral Oral  SpO2: 96%     Weight:      Height:       General: alert, cooperative and no distress Lochia: inappropriate Uterine Fundus: firm Skin: R labia swollen with ecchymosis, no erythema or fluctuance DVT Evaluation: No evidence of DVT seen on physical  exam. Negative Homan's sign. No cords or calf tenderness. Labs: Lab Results  Component Value Date   WBC 11.4 (H) 04/11/2016   HGB 12.2 04/11/2016   HCT 36.6 04/11/2016   MCV 91.7 04/11/2016   PLT 156 04/11/2016   CMP Latest Ref Rng & Units 04/26/2014  BUN 6 - 23 mg/dL 15  Creatinine 6.640.50 - 4.031.10 mg/dL 4.740.81  AST 0 - 37 U/L 21    Discharge instruction: per After Visit Summary and "Baby and Me Booklet".  After visit meds:    Medication List    TAKE these medications   Prenatal Vitamins 28-0.8 MG Tabs Take 1 tablet by mouth daily.   senna-docusate 8.6-50 MG tablet Commonly known as:  Senokot-S Take 2 tablets by mouth at bedtime as needed for mild constipation.       Diet: routine diet  Activity: Advance as tolerated. Pelvic rest for 6 weeks.  Outpatient follow up:6 weeks Follow up Appt: Future Appointments Date Time Provider Department Center  05/21/2016 10:20 AM Levie HeritageJacob J Stinson, DO [facility]   Follow up Visit: Follow-up Information    [facility] Schedule an appointment as soon as possible for a visit in 6 week(s).   Specialty:  Obstetrics and Gynecology Why:  for postpartum followup Contact information: [address] [place] [phone]       [facility] .   Why:  as needed for any potential emergencies Contact information: [address] [place] [phone]          Postpartum contraception: Progesterone only pills  Newborn Data: Live born female  Birth Weight: 7 lb 14.3 oz (3581 g) APGAR: 9, 9  Baby Feeding: Breast Disposition:home with mother   04/11/2016 Leland HerElsia J Yoo, DO   OB FELLOW DISCHARGE ATTESTATION  I have seen and examined this patient and agree with above documentation in the resident's note.   Jen MowElizabeth Mumaw, DO OB Fellow 04/11/2016 8:32 AM

## 2016-04-11 NOTE — Progress Notes (Signed)
MOB was referred for history of depression/anxiety.  Referral is screened out by Clinical Social Worker because none of the following criteria appear to apply and  there are no reports impacting the pregnancy or her transition to the postpartum period. CSW does not deem it clinically necessary to further investigate at this time.  -History of anxiety/depression during this pregnancy, or of post-partum depression.  - Diagnosis of anxiety and/or depression within last 3 years.-  - History of depression due to pregnancy loss/loss of child or -MOB's symptoms are currently being treated with medication and/or therapy.  (history of medication, no new symptoms)  Please contact the Clinical Social Worker if needs arise or upon MOB request.     Danta Baumgardner LCSW, MSW Clinical Social Work: System Wide Float Coverage for [place] NICU Clinical social worker [phone] 

## 2016-04-13 ENCOUNTER — Encounter (HOSPITAL_COMMUNITY): Payer: Self-pay | Admitting: *Deleted

## 2016-04-13 ENCOUNTER — Inpatient Hospital Stay (HOSPITAL_COMMUNITY)
Admission: AD | Admit: 2016-04-13 | Discharge: 2016-04-13 | Disposition: A | Discharge status: Home / Self Care | Payer: Medicaid Other | Source: Ambulatory Visit | Attending: Obstetrics and Gynecology | Admitting: Obstetrics and Gynecology

## 2016-04-13 DIAGNOSIS — O9279 Other disorders of lactation: Secondary | ICD-10-CM | POA: Diagnosis present

## 2016-04-13 DIAGNOSIS — F419 Anxiety disorder, unspecified: Secondary | ICD-10-CM | POA: Insufficient documentation

## 2016-04-13 DIAGNOSIS — N644 Mastodynia: Secondary | ICD-10-CM | POA: Diagnosis present

## 2016-04-13 DIAGNOSIS — O9989 Other specified diseases and conditions complicating pregnancy, childbirth and the puerperium: Secondary | ICD-10-CM | POA: Insufficient documentation

## 2016-04-13 DIAGNOSIS — N6489 Other specified disorders of breast: Secondary | ICD-10-CM

## 2016-04-13 NOTE — MAU Note (Signed)
Pt was discharged on Friday.  Pt has been breastfeeding and supplementing.  Once she started supplementing she decided to stop breastfeeding.  Pt states she has a lump near her right armpit.  Pt states her breast are very engorged.  Pt states she tried to get an appointment with the clinic but was told to come here since it would take too long to get an appointment.  Pt states she wants something to help the milk dry up.

## 2016-04-13 NOTE — Discharge Instructions (Signed)
Breast Engorgement °Breast engorgement is the overfilling of your breasts with breast milk. In the first few weeks after giving birth, you may experience breast engorgement. Although it is normal for your breasts to feel heavy, full, and uncomfortable within 3-5 days of giving birth, breast engorgement can make your breasts throb and feel hard, tightly stretched, warm, and tender. Engorgement peaks about the fifth day after you give birth. Breast engorgement can be easily treated and does not require you to stop breastfeeding.  °CAUSES OF BREAST ENGORGEMENT °Some women delay feedings because of sore or cracked nipples, which can lead to engorgement. Cracked and sore nipples often are caused by inadequate latching (when your baby's mouth attaches to your breast to breastfeed). If your baby is latched on properly, he or she should be able to breastfeed as long as needed, without causing any pain. If you do feel pain while breastfeeding, take your baby off your breast and try again. Get help from your health care provider or a lactation consultant if you continue to have pain. °Other causes of engorgement include:  °· Improper position of your baby while breastfeeding. °· Allowing too much time to pass between feedings. °· Reduction in breastfeeding because you give your baby water, juice, formula, breast milk from a bottle, or a pacifier instead of breastfeeding. °· Changes in your baby's feeding patterns. °· Weak sucking from your baby, which causes less milk to be taken out of your breast during feedings. °· Fatigue, stress, anemia. °· Plugged milk ducts. °· A history of breast surgery. °SIGNS AND SYMPTOMS OF BREAST ENGORGEMENT °If your breasts become engorged, you may experience:  °· Breast swelling, tenderness, warmth, redness, or throbbing. °· Breast hardness and stretching of the skin around your breast. °· Flattening, tightening, and hardening of your nipple. °· A low-grade fever, which can be confused with a  breast infection. °RECOMMENDATIONS TO EASE BREAST ENGORGEMENT °Breast engorgement should improve in 24-48 hours after following these recommendations: °· Breastfeed when you feel the need to reduce the fullness of your breasts or when your baby shows signs of hunger. This is called "breastfeeding on demand." °· Newborns (babies younger than 4 weeks) often breastfeed every 1-3 hours during the day. You may need to awaken your baby to feed if he or she is asleep at a feeding time. °· Do not allow your baby to sleep longer than 5 hours during the night without a feeding. °· Pump or hand-express breast milk before breastfeeding to soften your breast, areola, and nipple.   °· Apply warm, moist heat (in the shower or with warm water-soaked hand towels) just before feeding or pumping, or massage your breast before or during breastfeeding. This increases circulation and helps your milk to flow. °· Completely empty your breasts when breastfeeding or pumping. Afterward, wear a snug bra (nursing or regular) or tank top for 1-2 days to signal your body to slightly decrease milk production. Only wear snug bras or tank tops to treat engorgement. Tight bras typically should be avoided by breastfeeding mothers. Once engorgement is relieved, return to wearing regular, loose-fitting clothes. °· Apply ice packs to your breasts to lessen the pain from engorgement and relieve swelling, unless the ice is uncomfortable for you. °· Do not delay feedings. Try to relax when it is time to feed your baby. This helps to trigger your "let-down reflex," which releases milk from your breast. °· Ensure your baby is latched on to your breast and positioned properly while breastfeeding.   °· Allow   your baby to remain at your breast as long as he or she is latched on well and actively sucking. Your baby will let you know when he or she is done breastfeeding by pulling away from your breast or falling asleep.  Avoid introducing bottles or pacifiers  to your baby in the early weeks of breastfeeding. Wait to introduce these things until after resolving any breastfeeding challenges.  Try to pump your milk on the same schedule as when your baby would breastfeed if you are returning to work or away from home for an extended period.  Drink plenty of fluids to avoid dehydration, which can eventually put you at greater risk of breast engorgement.  CALL YOUR HEALTH CARE PROVIDER OR LACTATION CONSULTANT IF:   Engorgement lasts longer than 2 days, even after treatment.  You have flu-like symptoms, such as a fever, chills, or body aches.  Your breasts become increasingly red and painful.   This information is not intended to replace advice given to you by your health care provider. Make sure you discuss any questions you have with your health care provider.   Document Released: 12/06/2004 Document Revised: 09/01/2014 Document Reviewed: 02/03/2013 Elsevier Interactive Patient Education 2016 [employer].    Breastfeeding Challenges and Solutions Even though breastfeeding is natural, it can be challenging, especially in the first few weeks after childbirth. It is normal for problems to arise when starting to breastfeed your new baby, even if you have breastfed before. This document provides some solutions to the most common breastfeeding challenges.  CHALLENGES AND SOLUTIONS Challenge--Cracked or Sore Nipples Cracked or sore nipples are commonly experienced by breastfeeding mothers. Cracked or sore nipples often are caused by inadequate latching (when your baby's mouth attaches to your breast to breastfeed). Soreness can also happen if your baby is not positioned properly at your breast. Although nipple cracking and soreness are common during the first week after birth, nipple pain is never normal. If you experience nipple cracking or soreness that lasts longer than 1 week or nipple pain, call your health care provider or lactation consultant.    Solution Ensure proper latching and positioning of your baby by following the steps below:  Find a comfortable place to sit or lie down, with your neck and back well supported.  Place a pillow or rolled up blanket under your baby to bring him or her to the level of your breast (if you are seated).  Make sure that your baby's abdomen is facing your abdomen.  Gently massage your breast. With your fingertips, massage from your chest wall toward your nipple in a circular motion. This encourages milk flow. You may need to continue this action during the feeding if your milk flows slowly.  Support your breast with 4 fingers underneath and your thumb above your nipple. Make sure your fingers are well away from your nipple and your baby's mouth.  Stroke your baby's lips gently with your finger or nipple.  When your baby's mouth is open wide enough, quickly bring your baby to your breast, placing your entire nipple and as much of the colored area around your nipple (areola) as possible into your baby's mouth.  More areola should be visible above your baby's upper lip than below the lower lip.  Your baby's tongue should be between his or her lower gum and your breast.  Ensure that your baby's mouth is correctly positioned around your nipple (latched). Your baby's lips should create a seal on your breast and be  turned out (everted).  It is common for your baby to suck for about 2-3 minutes in order to start the flow of breast milk. Signs that your baby has successfully latched on to your nipple include:   Quietly tugging or quietly sucking without causing you pain.   Swallowing heard between every 3-4 sucks.   Muscle movement above and in front of his or her ears with sucking.  Signs that your baby has not successfully latched on to nipple include:   Sucking sounds or smacking sounds from your baby while nursing.   Nipple pain.  Ensure that your breasts stay moisturized and healthy  by:  Avoiding the use of soap on your nipples.   Wearing a supportive bra. Avoid wearing underwire-style bras or tight bras.   Air drying your nipples for 3-4 minutes after each feeding.   Using only cotton bra pads to absorb breast milk leakage. Leaking of breast milk between feedings is normal. Be sure to change the pads if they become soaked with milk.  Using lanolin on your nipples after nursing. Lanolin helps to maintain your skin's normal moisture barrier. If you use pure lanolin you do not need to wash it off before feeding your baby again. Pure lanolin is not toxic to your baby. You may also hand express a few drops of breast milk and gently massage that milk into your nipples, allowing it to air dry. Challenge--Breast Engorgement Breast engorgement is the overfilling of your breasts with breast milk. In the first few weeks after giving birth, you may experience breast engorgement. Breast engorgement can make your breasts throb and feel hard, tightly stretched, warm, and tender. Engorgement peaks about the fifth day after you give birth. Having breast engorgement does not mean you have to stop breastfeeding your baby. Solution  Breastfeed when you feel the need to reduce the fullness of your breasts or when your baby shows signs of hunger. This is called "breastfeeding on demand."  Newborns (babies younger than 4 weeks) often breastfeed every 1-3 hours during the day. You may need to awaken your baby to feed if he or she is asleep at a feeding time.  Do not allow your baby to sleep longer than 5 hours during the night without a feeding.  Pump or hand express breast milk before breastfeeding to soften your breast, areola, and nipple.  Apply warm, moist heat (in the shower or with warm water-soaked hand towels) just before feeding or pumping, or massage your breast before or during breastfeeding. This increases circulation and helps your milk to flow.  Completely empty your  breasts when breastfeeding or pumping. Afterward, wear a snug bra (nursing or regular) or tank top for 1-2 days to signal your body to slightly decrease milk production. Only wear snug bras or tank tops to treat engorgement. Tight bras typically should be avoided by breastfeeding mothers. Once engorgement is relieved, return to wearing regular, loose-fitting clothes.  Apply ice packs to your breasts to lessen the pain from engorgement and relieve swelling, unless the ice is uncomfortable for you.  Do not delay feedings. Try to relax when it is time to feed your baby. This helps to trigger your "let-down reflex," which releases milk from your breast.  Ensure your baby is latched on to your breast and positioned properly while breastfeeding.  Allow your baby to remain at your breast as long as he or she is latched on well and actively sucking. Your baby will let you know when he  or she is done breastfeeding by pulling away from your breast or falling asleep.  Avoid introducing bottles or pacifiers to your baby in the early weeks of breastfeeding. Wait to introduce these things until after resolving any breastfeeding challenges.  Try to pump your milk on the same schedule as when your baby would breastfeed if you are returning to work or away from home for an extended period.  Drink plenty of fluids to avoid dehydration, which can eventually put you at greater risk of breast engorgement. If you follow these suggestions, your engorgement should improve in 24-48 hours. If you are still experiencing difficulty, call your lactation consultant or health care provider.  Challenge--Plugged Milk Ducts Plugged milk ducts occur when the duct does not drain milk effectively and becomes swollen. Wearing a tight-fitting nursing bra or having difficulty with latching may cause plugged milk ducts. Not drinking enough water (8-10 c [1.9-2.4 L] per day) can contribute to plugged milk ducts. Once a duct has become  plugged, hard lumps, soreness, and redness may develop in your breast.  Solution Do not delay feedings. Feed your baby frequently and try to empty your breasts of milk at each feeding. Try breastfeeding from the affected side first so there is a better chance that the milk will drain completely from that breast. Apply warm, moist towels to your breasts for 5-10 minutes before feeding. Alternatively, a hot shower right before breastfeeding can provide the moist heat that can encourage milk flow. Gentle massage of the sore area before and during a feeding may also help. Avoid wearing tight clothing or bras that put pressure on your breasts. Wear bras that offer good support to your breasts, but avoid underwire bras. If you have a plugged milk duct and develop a fever, you need to see your health care provider.  Challenge--Mastitis Mastitis is inflammation of your breast. It usually is caused by a bacterial infection and can cause flu-like symptoms. You may develop redness in your breast and a fever. Often when mastitis occurs, your breast becomes firm, warm, and very painful. The most common causes of mastitis are poor latching, ineffective sucking from your baby, consistent pressure on your breast (possibly from wearing a tight-fitting bra or shirt that restricts the milk flow), unusual stress or fatigue, or missed feedings.  Solution You will be given antibiotic medicine to treat the infection. It is still important to breastfeed frequently to empty your breasts. Continuing to breastfeed while you recover from mastitis will not harm your baby. Make sure your baby is positioned properly during every feeding. Apply moist heat to your breasts for a few minutes before feeding to help the milk flow and to help your breasts empty more easily. Challenge--Thrush Ginette Pitman is a yeast infection that can form on your nipples, in your breast, or in your baby's mouth. It causes itching, soreness, burning or stabbing pain,  and sometimes a rash.  Solution You will be given a medicated ointment for your nipples, and your baby will be given a liquid medicine for his or her mouth. It is important that you and your baby are treated at the same time because thrush can be passed between you and your baby. Change disposable nursing pads often. Any bras, towels, or clothing that come in contact with infected areas of your body or your baby's body need to be washed in very hot water every day. Wash your hands and your baby's hands often. All pacifiers, bottle nipples, or toys your baby puts in  his or her mouth should be boiled once a day for 20 minutes. After 1 week of treatment, discard pacifiers and bottle nipples and buy new ones. All breast pump parts that touch the milk need to be boiled for 20 minutes every day. Challenge--Low Milk Supply You may not be producing enough milk if your baby is not gaining the proper amount of weight. Breast milk production is based on a supply-and-demand system. Your milk supply depends on how frequently and effectively your baby empties your breast. Solution The more you breastfeed and pump, the more breast milk you will produce. It is important that your baby empties at least one of your breasts at each feeding. If this is not happening, then use a breast pump or hand express any milk that remains. This will help to drain as much milk as possible at each feeding. It will also signal your body to produce more milk. If your baby is not emptying your breasts, it may be due to latching, sucking, or positioning problems. If low milk supply continues after addressing these issues, contact your health care provider or a lactation specialist as soon as possible. Challenge--Inverted or Flat Nipples Some women have nipples that turn inward instead of protruding outward. Other women have nipples that are flat. Inverted or flat nipples can sometimes make it more difficult for your baby to latch onto your  breast. Solution You may be given a small device that pulls out inverted nipples. This device should be applied right before your baby is brought to your breast. You can also try using a breast pump for a short time before placing the baby at your breast. The pump can pull your nipple outwards to help your infant latch more easily. The baby's sucking motion will help the inverted nipple protrude as well.  If you have flat nipples, encourage your baby to latch onto your breast and feed frequently in the early days after birth. This will give your baby practice latching on correctly while your breast is still soft. When your milk supply increases, between the second and fifth day after birth and your breasts become full, your baby will have an easier time latching.  Contact a lactation consultant if you still have concerns. She or he can teach you additional techniques to address breastfeeding problems related to nipple shape and position.  FOR MORE INFORMATION [employer]: www.llli.org   This information is not intended to replace advice given to you by your health care provider. Make sure you discuss any questions you have with your health care provider.   Document Released: 02/02/2006 Document Revised: 09/01/2014 Document Reviewed: 02/04/2013 Elsevier Interactive Patient Education 2016 [employer] Breastfeeding and Mastitis Mastitis is inflammation of the breast tissue. It can occur in women who are breastfeeding. This can make breastfeeding painful. Mastitis will sometimes go away on its own. Your health care provider will help determine if treatment is needed. CAUSES Mastitis is often associated with a blocked milk (lactiferous) duct. This can happen when too much milk builds up in the breast. Causes of excess milk in the breast can include:  Poor latch-on. If your baby is not latched onto the breast properly, she or he may not empty your breast completely while  breastfeeding.  Allowing too much time to pass between feedings.  Wearing a bra or other clothing that is too tight. This puts extra pressure on the lactiferous ducts so milk does not flow through them as it should. Mastitis can  also be caused by a bacterial infection. Bacteria may enter the breast tissue through cuts or openings in the skin. In women who are breastfeeding, this may occur because of cracked or irritated skin. Cracks in the skin are often caused when your baby does not latch on properly to the breast. SIGNS AND SYMPTOMS  Swelling, redness, tenderness, and pain in an area of the breast.  Swelling of the glands under the arm on the same side.  Fever may or may not accompany mastitis. If an infection is allowed to progress, a collection of pus (abscess) may develop. DIAGNOSIS  Your health care provider can usually diagnose mastitis based on your symptoms and a physical exam. Tests may be done to help confirm the diagnosis. These may include:  Removal of pus from the breast by applying pressure to the area. This pus can be examined in the lab to determine which bacteria are present. If an abscess has developed, the fluid in the abscess can be removed with a needle. This can also be used to confirm the diagnosis and determine the bacteria present. In most cases, pus will not be present.  Blood tests to determine if your body is fighting a bacterial infection.  Mammogram or ultrasound tests to rule out other problems or diseases. TREATMENT  Mastitis that occurs with breastfeeding will sometimes go away on its own. Your health care provider may choose to wait 24 hours after first seeing you to decide whether a prescription medicine is needed. If your symptoms are worse after 24 hours, your health care provider will likely prescribe an antibiotic medicine to treat the mastitis. He or she will determine which bacteria are most likely causing the infection and will then select an  appropriate antibiotic medicine. This is sometimes changed based on the results of tests performed to identify the bacteria, or if there is no response to the antibiotic medicine selected. Antibiotic medicines are usually given by mouth. You may also be given medicine for pain. HOME CARE INSTRUCTIONS  Only take over-the-counter or prescription medicines for pain, fever, or discomfort as directed by your health care provider.  If your health care provider prescribed an antibiotic medicine, take the medicine as directed. Make sure you finish it even if you start to feel better.  Do not wear a tight or underwire bra. Wear a soft, supportive bra.  Increase your fluid intake, especially if you have a fever.  Continue to empty the breast. Your health care provider can tell you whether this milk is safe for your infant or needs to be thrown out. You may be told to stop nursing until your health care provider thinks it is safe for your baby. Use a breast pump if you are advised to stop nursing.  Keep your nipples clean and dry.  Empty the first breast completely before going to the other breast. If your baby is not emptying your breasts completely for some reason, use a breast pump to empty your breasts.  If you go back to work, pump your breasts while at work to stay in time with your nursing schedule.  Avoid allowing your breasts to become overly filled with milk (engorged). SEEK MEDICAL CARE IF:  You have pus-like discharge from the breast.  Your symptoms do not improve with the treatment prescribed by your health care provider within 2 days. SEEK IMMEDIATE MEDICAL CARE IF:  Your pain and swelling are getting worse.  You have pain that is not controlled with medicine.  You  have a red line extending from the breast toward your armpit.  You have a fever or persistent symptoms for more than 2-3 days.  You have a fever and your symptoms suddenly get worse. MAKE SURE YOU:   Understand  these instructions.  Will watch your condition.  Will get help right away if you are not doing well or get worse.   This information is not intended to replace advice given to you by your health care provider. Make sure you discuss any questions you have with your health care provider.   Document Released: 12/06/2004 Document Revised: 08/16/2013 Document Reviewed: 03/17/2013 Elsevier Interactive Patient Education [employer]

## 2016-04-13 NOTE — MAU Provider Note (Signed)
Chief Complaint: Breast Pain and Breast Mass   First Provider Initiated Contact with Patient 04/13/16 1508      SUBJECTIVE HPI: Carrie [name] is a 25 y.o. [id number] postpartum day #3 who presents to Maternity Admissions reporting pain in breasts and lump in left axilla.  Patient had attempted to breastfeed, but stopped due to baby was getting too jaundiced and would not stool enough in the hospital, so patient "gave up" despite desiring to breastfeed. Since leaving the hospital, she has not pumped and her breasts were getting hard and painful. No erythema or fevers. Breasts are not hot to touch. She does admit to galactorrhea out of both breasts.    Past Medical History:  Diagnosis Date  . Anxiety    hx of meds  . Heart murmur    at birth  . Infection    UTI   OB History  Gravida Para Term Preterm AB Living  3 1 1  0 2 1  SAB TAB Ectopic Multiple Live Births  0 1 1 0 1    # Outcome Date GA Lbr Len/2nd Weight Sex Delivery Anes PTL Lv  3 Term 04/10/16 [id number] 06:40 / 04:42 7 lb 14.3 oz (3.581 kg) F Vag-Spont EPI  LIV  2 Ectopic              Birth Comments: System Generated. Please review and update pregnancy details.  1 TAB              Birth Comments: "underage, took abortion pill, no complications"     Past Surgical History:  Procedure Laterality Date  . NO PAST SURGERIES     Social History   Social History  . Marital status: Single    Spouse name: N/A  . Number of children: N/A  . Years of education: N/A   Occupational History  . Not on file.   Social History Main Topics  . Smoking status: Never Smoker  . Smokeless tobacco: Former NeurosurgeonUser  . Alcohol use No  . Drug use: No  . Sexual activity: Yes    Birth control/ protection: None   Other Topics Concern  . Not on file   Social History Narrative  . No narrative on file   No current facility-administered medications on file prior to encounter.    Current Outpatient Prescriptions on File Prior to Encounter   Medication Sig Dispense Refill  . Prenatal Vit-Fe Fumarate-FA (PRENATAL VITAMINS) 28-0.8 MG TABS Take 1 tablet by mouth daily. 30 tablet 5  . senna-docusate (SENOKOT-S) 8.6-50 MG tablet Take 2 tablets by mouth at bedtime as needed for mild constipation. (Patient not taking: Reported on 04/13/2016) 30 tablet 0   Allergies  Allergen Reactions  . Codeine Nausea Only    Raises body temperature    I have reviewed the past Medical Hx, Surgical Hx, Social Hx, Allergies and Medications.   REVIEW OF SYSTEMS  OPHTHALMIC: negative for - blurry vision, decreased vision, double vision, photophobia or scotomata RESPIRATORY: no cough, shortness of breath, or wheezing CARDIOVASCULAR: no chest pain or dyspnea on exertion BREAST: Bilateral breast pain and engorgement with lump in right axillary region. GASTROINTESTINAL: no abdominal pain, change in bowel habits, or black or bloody stools negative for - epigastric or RUQ pain GENITO-URINARY: no dysuria, trouble voiding, or hematuria negative for - genital discharge, vulvar/vaginal symptoms or vaginal bleeding MUSKULOSKELETAL: negative for - gait disturbance or swelling in ankle - bilateral, foot - bilateral and leg - bilateral NEUROLOGICAL: negative for - dizziness,  gait disturbance, headaches, numbness/tingling or visual changes DERMATOLOGICAL: negative   OBJECTIVE Patient Vitals for the past 24 hrs:  BP Temp Temp src Pulse Resp  04/13/16 1548 123/75 - - 82 -  04/13/16 1500 118/70 98.4 F (36.9 C) Oral 85 18    PHYSICAL EXAM Constitutional: Well-developed, well-nourished female in no acute distress.  Cardiovascular: normal rate Respiratory: normal rate and effort.  BREAST: Bilateral severe engorgement, 4cm round lump in right axillary region from blocked milk duct, tender to touch. Expressing milky discharge from bilateral nipples with minimal expression. No erythema of breast tissue. GI: Abd soft, non-tender, gravid appropriate for  gestational age. Pos BS x 4 MS: Extremities nontender, no edema, normal ROM Neurologic: Alert and oriented x 4.  GU: Neg CVAT.   LAB RESULTS No results found for this or any previous visit (from the past 24 hour(s)).  IMAGING [place] Mfm Ob Limited  Result Date: 04/07/2016 OBSTETRICAL ULTRASOUND: This exam was performed within a [facility]. The OB US report was generated in the AS system, and faxed to the ordering physician.  This report is available in the [employer] PACS. See the AS Obstetric US report via the Image Link.   MAU COURSE Spent 50% of visit counseling patient about breastfeeding, weaning breastfeeding, signs/symptoms of mastitis, and how to prevent/treat engorgement.  MDM Evaluated patient, no signs of mastitis. Significant engorgement, able to express milk from bilateral nipples. Baby with patient today, demonstrated breastfeeding appropriately. Discussed breastfeeding barriers, and patient expressed she'd like to try breastfeeding again. Discussed appropriate methods of weaning from breastfeeding to prevent future engorgement or mastitis. Plan of care reviewed with patient, including resources for breastfeeding/lactation.   ASSESSMENT 1. Breast engorgement, obstetric, delivered with postpartum condition   2. Occlusion of breast duct     PLAN Discharge home in stable condition. Precautions about mastitis given.    Medication List    TAKE these medications   Prenatal Vitamins 28-0.8 MG Tabs Take 1 tablet by mouth daily.   senna-docusate 8.6-50 MG tablet Commonly known as:  Senokot-S Take 2 tablets by mouth at bedtime as needed for mild constipation.        [address] Woodland [place], [place] 04/13/2016  3:55 PM

## 2016-04-15 ENCOUNTER — Telehealth: Payer: Self-pay | Admitting: *Deleted

## 2016-04-15 MED ORDER — BUSPIRONE HCL 10 MG PO TABS: 10.0000 mg | ORAL_TABLET | Freq: Two times a day (BID) | ORAL | 1 refills | Status: DC

## 2016-04-15 MED ORDER — FLUOXETINE HCL 20 MG PO CAPS: 20.0000 mg | ORAL_CAPSULE | Freq: Every day | ORAL | 1 refills | Status: DC

## 2016-04-15 NOTE — Telephone Encounter (Signed)
Pt called and stated that she delivered her baby on 8/17. She is beginning to feel anxious and requests Rx. She denies suicidal or homicidal thoughts and states that she has help with care of the baby. She is sure that what she is feeling is anxiety because she has a long standing history of this problem. Prior to pregnancy she was taking Prozac 20 mg daily and Buspar 10 mg prn for anxiety. Rx received from Dr. Shawnie PonsPratt and sent to pharmacy.  Pt also advised that Dr. Shawnie PonsPratt would like her to see Asher MuirJamie.  Pt voiced understanding and agreed to plan of care.

## 2016-04-18 ENCOUNTER — Inpatient Hospital Stay (HOSPITAL_COMMUNITY)
Admission: AD | Admit: 2016-04-18 | Discharge: 2016-04-18 | Disposition: A | Discharge status: Home / Self Care | Payer: Medicaid Other | Source: Ambulatory Visit | Attending: Obstetrics & Gynecology | Admitting: Obstetrics & Gynecology

## 2016-04-18 ENCOUNTER — Encounter (HOSPITAL_COMMUNITY): Payer: Self-pay | Admitting: *Deleted

## 2016-04-18 DIAGNOSIS — N6459 Other signs and symptoms in breast: Secondary | ICD-10-CM | POA: Insufficient documentation

## 2016-04-18 DIAGNOSIS — L259 Unspecified contact dermatitis, unspecified cause: Secondary | ICD-10-CM

## 2016-04-18 DIAGNOSIS — Z79899 Other long term (current) drug therapy: Secondary | ICD-10-CM | POA: Insufficient documentation

## 2016-04-18 DIAGNOSIS — Z888 Allergy status to other drugs, medicaments and biological substances status: Secondary | ICD-10-CM | POA: Insufficient documentation

## 2016-04-18 DIAGNOSIS — R55 Syncope and collapse: Secondary | ICD-10-CM | POA: Diagnosis not present

## 2016-04-18 DIAGNOSIS — R509 Fever, unspecified: Secondary | ICD-10-CM | POA: Insufficient documentation

## 2016-04-18 DIAGNOSIS — F419 Anxiety disorder, unspecified: Secondary | ICD-10-CM | POA: Diagnosis not present

## 2016-04-18 LAB — URINALYSIS, ROUTINE W REFLEX MICROSCOPIC
Bilirubin Urine: NEGATIVE
Glucose, UA: NEGATIVE mg/dL
KETONES UR: NEGATIVE mg/dL
NITRITE: NEGATIVE
PROTEIN: 30 mg/dL — AB
Specific Gravity, Urine: 1.015 (ref 1.005–1.030)
pH: 7 (ref 5.0–8.0)

## 2016-04-18 LAB — URINE MICROSCOPIC-ADD ON: Bacteria, UA: NONE SEEN

## 2016-04-18 LAB — CBC
HEMATOCRIT: 42.5 % (ref 36.0–46.0)
HEMOGLOBIN: 14.9 g/dL (ref 12.0–15.0)
MCH: 31.8 pg (ref 26.0–34.0)
MCHC: 35.1 g/dL (ref 30.0–36.0)
MCV: 90.6 fL (ref 78.0–100.0)
Platelets: 258 10*3/uL (ref 150–400)
RBC: 4.69 MIL/uL (ref 3.87–5.11)
RDW: 12.7 % (ref 11.5–15.5)
WBC: 5.9 10*3/uL (ref 4.0–10.5)

## 2016-04-18 MED ORDER — HYDROCORTISONE 1 % EX CREA: 1.0000 "application " | TOPICAL_CREAM | Freq: Two times a day (BID) | CUTANEOUS | 0 refills | Status: DC

## 2016-04-18 NOTE — Discharge Instructions (Signed)
Contact Dermatitis Dermatitis is redness, soreness, and swelling (inflammation) of the skin. Contact dermatitis is a reaction to certain substances that touch the skin. You either touched something that irritated your skin, or you have allergies to something you touched.  HOME CARE  Skin Care  Moisturize your skin as needed.  Apply cool compresses to the affected areas.   Try taking a bath with:   Epsom salts. Follow the instructions on the package. You can get these at a pharmacy or grocery store.   Baking soda. Pour a small amount into the bath as told by your doctor.   Colloidal oatmeal. Follow the instructions on the package. You can get this at a pharmacy or grocery store.   Try applying baking soda paste to your skin. Stir water into baking soda until it looks like paste.  Do not scratch your skin.   Bathe less often.  Bathe in lukewarm water. Avoid using hot water.  Medicines  Take or apply over-the-counter and prescription medicines only as told by your doctor.   If you were prescribed an antibiotic medicine, take or apply your antibiotic as told by your doctor. Do not stop taking the antibiotic even if your condition starts to get better. General Instructions  Keep all follow-up visits as told by your doctor. This is important.   Avoid the substance that caused your reaction. If you do not know what caused it, keep a journal to try to track what caused it. Write down:   What you eat.   What cosmetic products you use.   What you drink.   What you wear in the affected area. This includes jewelry.   If you were given a bandage (dressing), take care of it as told by your doctor. This includes when to change and remove it.  GET HELP IF:   You do not get better with treatment.   Your condition gets worse.   You have signs of infection such as:  Swelling.  Tenderness.  Redness.  Soreness.  Warmth.   You have a fever.   You have new  symptoms.  GET HELP RIGHT AWAY IF:   You have a very bad headache.  You have neck pain.  Your neck is stiff.   You throw up (vomit).   You feel very sleepy.   You see red streaks coming from the affected area.   Your bone or joint underneath the affected area becomes painful after the skin has healed.   The affected area turns darker.   You have trouble breathing.    This information is not intended to replace advice given to you by your health care provider. Make sure you discuss any questions you have with your health care provider.   Document Released: 06/08/2009 Document Revised: 05/02/2015 Document Reviewed: 12/27/2014 Elsevier Interactive Patient Education 2016 Elsevier Inc.  Mastitis  Mastitis is redness, soreness, and puffiness (inflammation) in an area of the breast. It is often caused by an infection that occurs when bacteria enter the skin. The infection is often helped by antibiotic medicine. HOME CARE  Only take medicines as told by your doctor.  If your doctor prescribed an antibiotic medicine, take it as told. Finish it even if you start to feel better.  Do not wear a tight or underwire bra. Wear a soft support bra.  Drink more fluids, especially if you have a fever.  If you are breastfeeding:  Keep emptying the breast. Your doctor can tell you if the  milk is safe. Use a breast pump if you are told to stop nursing.  Keep your nipples clean and dry.  Empty the first breast before going to the other breast. Use a breast pump if your baby is not emptying your breast.  If you go back to work, pump your breasts while at work.  Avoid letting your breasts get overly filled with milk (engorged). GET HELP IF:  You have pus-like fluid leaking from your breast.  Your symptoms do not get better within 2 days. GET HELP RIGHT AWAY IF:  Your pain and puffiness are getting worse.  Your pain is not helped by medicine.  You have a red line going from  your breast toward your armpit.  You have a fever or lasting symptoms for more than 2-3 days.  You have a fever and your symptoms suddenly get worse. MAKE SURE YOU:   Understand these instructions.  Will watch your condition.  Will get help right away if you are not doing well or get worse.   This information is not intended to replace advice given to you by your health care provider. Make sure you discuss any questions you have with your health care provider.   Document Released: 07/30/2009 Document Revised: 04/13/2013 Document Reviewed: 03/11/2013 Elsevier Interactive Patient Education [employer]

## 2016-04-18 NOTE — MAU Provider Note (Signed)
Chief Complaint: Fever; Emesis; and possible mastitis   First Provider Initiated Contact with Patient 04/18/16 1101      SUBJECTIVE HPI: Carrie [name] is a 25 y.o. [id number] at Unknown who presents to Maternity Admissions reporting breast redness/bumps.  Patient was just previously seen in MAU about 5 days ago for severe breast engorgement. Red bumps around areola bilateral breasts, noticed this AM. No itching. +burning sensation on skin, with mild pain underneath. Has weaned off breast pumping/feeding over the course of 5 days, just using cold cabbages and ice. Last time pumped, two days ago. Using motrin.   Had one episode of LOC, had pre-syncopal episode and "passed out" as was trying to walk out the door. Feels lightheaded/weak when stands up.       Past Medical History:  Diagnosis Date  . Anxiety    hx of meds  . Heart murmur    at birth  . Infection    UTI   OB History  Gravida Para Term Preterm AB Living  3 1 1  0 2 1  SAB TAB Ectopic Multiple Live Births  0 1 1 0 1    # Outcome Date GA Lbr Len/2nd Weight Sex Delivery Anes PTL Lv  3 Term 04/10/16 [redacted]w[redacted]d 06:40 / 04:42 7 lb 14.3 oz (3.581 kg) F Vag-Spont EPI  LIV  2 Ectopic              Birth Comments: System Generated. Please review and update pregnancy details.  1 TAB              Birth Comments: "underage, took abortion pill, no complications"     Past Surgical History:  Procedure Laterality Date  . NO PAST SURGERIES     Social History   Social History  . Marital status: Single    Spouse name: N/A  . Number of children: N/A  . Years of education: N/A   Occupational History  . Not on file.   Social History Main Topics  . Smoking status: Never Smoker  . Smokeless tobacco: Former Neurosurgeon  . Alcohol use No  . Drug use: No  . Sexual activity: Yes    Birth control/ protection: None   Other Topics Concern  . Not on file   Social History Narrative  . No narrative on file   No current facility-administered  medications on file prior to encounter.    Current Outpatient Prescriptions on File Prior to Encounter  Medication Sig Dispense Refill  . busPIRone (BUSPAR) 10 MG tablet Take 1 tablet (10 mg total) by mouth 2 (two) times daily. 60 tablet 1  . FLUoxetine (PROZAC) 20 MG capsule Take 1 capsule (20 mg total) by mouth daily. 30 capsule 1  . Prenatal Vit-Fe Fumarate-FA (PRENATAL VITAMINS) 28-0.8 MG TABS Take 1 tablet by mouth daily. 30 tablet 5  . senna-docusate (SENOKOT-S) 8.6-50 MG tablet Take 2 tablets by mouth at bedtime as needed for mild constipation. (Patient not taking: Reported on 04/13/2016) 30 tablet 0   Allergies  Allergen Reactions  . Codeine Nausea Only    Raises body temperature    I have reviewed the past Medical Hx, Surgical Hx, Social Hx, Allergies and Medications.   REVIEW OF SYSTEMS  OPHTHALMIC: negative for - blurry vision, decreased vision, double vision, photophobia or scotomata RESPIRATORY: no cough, shortness of breath, or wheezing CARDIOVASCULAR: no chest pain or dyspnea on exertion GASTROINTESTINAL: no abdominal pain, change in bowel habits, or black or bloody stools negative for -  epigastric or RUQ pain GENITO-URINARY: no dysuria, trouble voiding, or hematuria negative for - genital discharge, vulvar/vaginal symptoms or vaginal bleeding MUSKULOSKELETAL: negative for - gait disturbance or swelling in ankle - bilateral, foot - bilateral and leg - bilateral NEUROLOGICAL: negative for - dizziness, gait disturbance, headaches, numbness/tingling or visual changes DERMATOLOGICAL: negative. Rash on breasts.  OBJECTIVE Patient Vitals for the past 24 hrs:  BP Temp Temp src Pulse Resp  04/18/16 1011 109/71 98 F (36.7 C) Oral 86 16    PHYSICAL EXAM Constitutional: Well-developed, well-nourished female in no acute distress.  Cardiovascular: normal rate, rhythm. No murmurs Respiratory: normal rate and effort.  CTAB Breast: Firm/hard behind areola, soft elsewhere. Red  papules surrounding bilateral areolas. Milk expressed from nipples bilaterally. Nontender. No skin erythema noted. GI: Abd soft, non-tender, gravid appropriate for gestational age. Pos BS x 4 MS: Extremities nontender, no edema, normal ROM Neurologic: Alert and oriented x 4.  GU: Neg CVAT.   LAB RESULTS Results for orders placed or performed during the hospital encounter of 04/18/16 (from the past 24 hour(s))  Urinalysis, Routine w reflex microscopic (not at [facility])     Status: Abnormal   Collection Time: 04/18/16 10:17 AM  Result Value Ref Range   Color, Urine YELLOW YELLOW   APPearance CLEAR CLEAR   Specific Gravity, Urine 1.015 1.005 - 1.030   pH 7.0 5.0 - 8.0   Glucose, UA NEGATIVE NEGATIVE mg/dL   Hgb urine dipstick LARGE (A) NEGATIVE   Bilirubin Urine NEGATIVE NEGATIVE   Ketones, ur NEGATIVE NEGATIVE mg/dL   Protein, ur 30 (A) NEGATIVE mg/dL   Nitrite NEGATIVE NEGATIVE   Leukocytes, UA LARGE (A) NEGATIVE  Urine microscopic-add on     Status: Abnormal   Collection Time: 04/18/16 10:17 AM  Result Value Ref Range   Squamous Epithelial / LPF 0-5 (A) NONE SEEN   WBC, UA 6-30 0 - 5 WBC/hpf   RBC / HPF TOO NUMEROUS TO COUNT 0 - 5 RBC/hpf   Bacteria, UA NONE SEEN NONE SEEN  CBC     Status: None   Collection Time: 04/18/16 10:47 AM  Result Value Ref Range   WBC 5.9 4.0 - 10.5 K/uL   RBC 4.69 3.87 - 5.11 MIL/uL   Hemoglobin 14.9 12.0 - 15.0 g/dL   HCT 16.1 09.6 - 04.5 %   MCV 90.6 78.0 - 100.0 fL   MCH 31.8 26.0 - 34.0 pg   MCHC 35.1 30.0 - 36.0 g/dL   RDW 40.9 81.1 - 91.4 %   Platelets 258 150 - 400 K/uL    IMAGING [place] Mfm Ob Limited  Result Date: 04/07/2016 OBSTETRICAL ULTRASOUND: This exam was performed within a [facility]. The OB US report was generated in the AS system, and faxed to the ordering physician.  This report is available in the [employer]. See the AS Obstetric US report via the Image Link.   MAU COURSE [facility] [employer] Demonstrated  to patient how to express milk if she does not want to use pump  MDM Plan of care reviewed with patient, including labs and tests ordered and medical treatment. Discussed signs/symptoms of mastitis, and when to seek medical treatment.    ASSESSMENT 1. Contact dermatitis     PLAN Discharge home in stable condition. Advised use of hydrocortisone cream for contact dermatitis on breasts, if worsens, stop using cabbage (may be irritant). Follow up with OB provider or PCP if worsens. Return if develops fevers/chills and skin becomes pinkish/red.  Follow-up Information    [facility]   Specialty:  Obstetrics and Gynecology Why:  Postpartum follow up Contact information: [address] [place] [id number] [phone]           Medication List    TAKE these medications   busPIRone 10 MG tablet Commonly known as:  BUSPAR Take 1 tablet (10 mg total) by mouth 2 (two) times daily.   FLUoxetine 20 MG capsule Commonly known as:  PROZAC Take 1 capsule (20 mg total) by mouth daily.   hydrocortisone cream 1 % Apply 1 application topically 2 (two) times daily. To affected area on breasts.   Prenatal Vitamins 28-0.8 MG Tabs Take 1 tablet by mouth daily.   senna-docusate 8.6-50 MG tablet Commonly known as:  Senokot-S Take 2 tablets by mouth at bedtime as needed for mild constipation.        [address] Woodland [place], [place] 04/18/2016  11:52 AM

## 2016-04-18 NOTE — MAU Note (Signed)
Came in last wk, had stopped breast feeding, using the cabbage leaves. No longer having engorgement issues, but thinks she has mastitis.  Has red patches on both breast, hot to touch, not really painful.  Has been having fever (did not actually check it) and vomiting.  Passed out  Day before yesterday. Has been light headed every time she stands up.

## 2016-04-18 NOTE — Lactation Note (Signed)
Lactation Consultation Note  Mom concerned that she may have mastitis. Agree with MAU provider that she does not have mastitis.  Skin on periareolar area looks like something is irritating it. Recommended treatment prescribed by provider as well as small amounts of BM to area. Reassurance given. Patient Name: Carrie [name] ZOXWR'UToday's Date: 04/18/2016     Maternal Data    Feeding    LATCH Score/Interventions                      Lactation Tools Discussed/Used     Consult Status      Soyla DryerJoseph, [name] 04/18/2016, 12:09 PM

## 2016-04-21 ENCOUNTER — Institutional Professional Consult (permissible substitution): Payer: Medicaid Other

## 2016-04-21 NOTE — Progress Notes (Signed)
Reactive NST. Continue with fetal testing and routine OB care

## 2016-04-22 ENCOUNTER — Ambulatory Visit (INDEPENDENT_AMBULATORY_CARE_PROVIDER_SITE_OTHER): Payer: Self-pay | Admitting: Clinical

## 2016-04-22 DIAGNOSIS — F411 Generalized anxiety disorder: Secondary | ICD-10-CM

## 2016-04-22 NOTE — Addendum Note (Signed)
Addended by: Hulda MarinMCMANNES, JAMIE C on: 04/22/2016 10:09 AM   Modules accepted: Orders

## 2016-04-22 NOTE — Progress Notes (Signed)
  ASSESSMENT: Pt currently experiencing Generalized anxiety disorder.[facility] Pt would benefit from psychoeducation and brief therapeutic intervention regarding coping with anxiety. Pt may benefit from group support. Stage of Change: precontemplative  PLAN: 1. F/U with behavioral health clinician in two weeks, or as needed 2. Psychiatric Medications: Buspar and Prozac, began 04-15-16(one week), continue taking as prescribed 3. Behavioral recommendations:   -Consider nap during baby's 3-hour morning nap -Consider taking 10-20 minute nap immediately after drinking morning caffeine, before caffeine fully begins working -Consider "letting go" of worries that she has no control -[facility] -Consider leaving healthy snacks in convenient locations throughout the house -Read educational material regarding coping with symptoms of anxiety (and postpartum depression) -Consider establishing psychiatry BH med management at any of walk-in clinics discussed([facility], [facility], [facility]) at least 30 days prior to current medication script ending  SUBJECTIVE: Pt. referred by Scheryl DarterJames Arnold, MD, for symptoms of anxiety Pt. reports the following symptoms/concerns: Pt states that her primary concern is that she feels anxious and tingly, that she is unable to take naps during day, and unable to fall back asleep when baby wakes up in early morning, and has passed out from not eating, with little appetite when under stress. Pt states that new sleep pattern is difficult adjustment for her. Duration of problem: Less than 2 weeks, recent increase in symptoms (Treated for GAD prior to pregnancy) Severity: moderate   OBJECTIVE: Orientation & Cognition: Oriented x3. Thought processes normal and appropriate to situation. Mood: anxious Affect: appropriate Appearance: appropriate Risk of harm to self or others: no known risk of harm to  self or others Substance use: none Assessments administered: none today  Diagnosis: Generalized anxiety disorder CPT Code: F41.1  -------------------------------------------- Other(s) present in the room: none  Time spent with patient in exam room: 30 minutes, 8:10-8:40am  Depression screen [facility] 2/9 03/24/2016 03/11/2016 09/24/2015  Decreased Interest 0 0 0  Down, Depressed, Hopeless 0 0 0  PHQ - 2 Score 0 0 0  Altered sleeping 1 1 -  Tired, decreased energy 1 1 -  Change in appetite 0 0 -  Feeling bad or failure about yourself  0 0 -  Trouble concentrating 0 0 -  Moving slowly or fidgety/restless 0 0 -  Suicidal thoughts - 0 -  PHQ-9 Score 2 2 -   GAD 7 : Generalized Anxiety Score 03/24/2016 03/11/2016  Nervous, Anxious, on Edge 2 1  Control/stop worrying 2 1  Worry too much - different things 1 1  Trouble relaxing 1 1  Restless 1 1  Easily annoyed or irritable 1 1  Afraid - awful might happen 0 0  Total GAD 7 Score 8 6

## 2016-04-25 ENCOUNTER — Telehealth: Payer: Self-pay | Admitting: Clinical

## 2016-04-25 NOTE — Telephone Encounter (Signed)
error 

## 2016-04-25 NOTE — Telephone Encounter (Signed)
Call back pt, as requested, concerning [facility]; it is recommended that pt request additional refills at upcoming appointment on 05-21-16, and that Dr. Adrian BlackwaterStinson will be notified.

## 2016-05-21 ENCOUNTER — Ambulatory Visit (INDEPENDENT_AMBULATORY_CARE_PROVIDER_SITE_OTHER): Payer: Medicaid Other | Admitting: Family Medicine

## 2016-05-21 DIAGNOSIS — Z30011 Encounter for initial prescription of contraceptive pills: Secondary | ICD-10-CM

## 2016-05-21 MED ORDER — FLUOXETINE HCL 20 MG PO CAPS: 20.0000 mg | ORAL_CAPSULE | Freq: Every day | ORAL | 1 refills | Status: DC

## 2016-05-21 MED ORDER — NORETHIN ACE-ETH ESTRAD-FE 1-20 MG-MCG(24) PO TABS: 1.0000 | ORAL_TABLET | Freq: Every day | ORAL | 11 refills | Status: DC

## 2016-05-21 MED ORDER — BUSPIRONE HCL 10 MG PO TABS: 10.0000 mg | ORAL_TABLET | Freq: Two times a day (BID) | ORAL | 1 refills | Status: DC

## 2016-05-21 NOTE — Progress Notes (Signed)
Subjective:     Carrie [name] is a 25 y.o. female who presents for a postpartum visit. She is 6 weeks postpartum following a spontaneous vaginal delivery. I have fully reviewed the prenatal and intrapartum course. The delivery was at 470w1d gestational weeks. Outcome: spontaneous vaginal delivery. Anesthesia: epidural. Postpartum course has been normal. Baby's course has been normal. Baby is feeding by bottle - Similac Sensitive RS. Bleeding red. Bowel function is normal. Bladder function is normal. Patient is not sexually active. Contraception method is OCP (estrogen/progesterone). Postpartum depression screening: negative.  Has been on prozac and buspar for depression. Was on it prior to pregnancy.  The following portions of the patient's history were reviewed and updated as appropriate: allergies, current medications, past family history, past medical history, past social history, past surgical history and problem list.  Review of Systems Pertinent items noted in HPI and remainder of comprehensive ROS otherwise negative.   Objective:    There were no vitals taken for this visit.  General:  alert, cooperative and no distress  Lungs: clear to auscultation bilaterally  Heart:  regular rate and rhythm, S1, S2 normal, no murmur, click, rub or gallop  Abdomen: soft, non-tender; bowel sounds normal; no masses,  no organomegaly        Assessment:     Normal postpartum exam. Pap smear not done at today's visit.   Plan:    1. Contraception: OCP (estrogen/progesterone) 2. Follow up in: 1 weeks or as needed.

## 2016-05-30 ENCOUNTER — Other Ambulatory Visit: Payer: Self-pay | Admitting: Family Medicine

## 2016-06-27 NOTE — Addendum Note (Signed)
Addended by: Hulda MarinMCMANNES, Tyri Elmore C on: 06/27/2016 08:33 AM   Modules accepted: Orders

## 2016-09-03 ENCOUNTER — Encounter: Payer: Self-pay | Admitting: Podiatry

## 2016-09-03 ENCOUNTER — Ambulatory Visit (INDEPENDENT_AMBULATORY_CARE_PROVIDER_SITE_OTHER): Payer: Medicaid Other | Admitting: Podiatry

## 2016-09-03 VITALS — BP 118/80 | HR 72 | Resp 14

## 2016-09-03 DIAGNOSIS — L600 Ingrowing nail: Secondary | ICD-10-CM

## 2016-09-03 DIAGNOSIS — L03031 Cellulitis of right toe: Secondary | ICD-10-CM | POA: Diagnosis not present

## 2016-09-03 MED ORDER — CEPHALEXIN 500 MG PO CAPS: 500.0000 mg | ORAL_CAPSULE | Freq: Two times a day (BID) | ORAL | 0 refills | Status: DC

## 2016-09-03 NOTE — Addendum Note (Signed)
Addended byMaury Dus: Roxann Vierra L on: 09/03/2016 03:28 PM   Modules accepted: Orders

## 2016-09-03 NOTE — Progress Notes (Signed)
   Subjective:    Patient ID: Carrie [name], female    DOB: [birth year], 26 y.o.   MRN: [id number]  HPI patient presents the office with chief complaint of a painful big toenail, right foot. She says that she has an ingrown toenail that is causing pain and discomfort as she walks and wears her shoes. She says the pain has and the ingrown nail has been present for approximately 4 days she has provided no self treatment or sought any professional help. She presents the office today for definitive evaluation and treatment of this condition    Review of Systems  All other systems reviewed and are negative.      Objective:   Physical Exam GENERAL APPEARANCE: Alert, conversant. Appropriately groomed. No acute distress.  VASCULAR: Pedal pulses are  palpable at  [facility] bilateral.  Capillary refill time is immediate to all digits,  Normal temperature gradient.  Digital hair growth is present bilateral  NEUROLOGIC: sensation is normal to 5.07 monofilament at 5/5 sites bilateral.  Light touch is intact bilateral, Muscle strength normal.  MUSCULOSKELETAL: acceptable muscle strength, tone and stability bilateral.  Intrinsic muscluature intact bilateral.  Rectus appearance of foot and digits noted bilateral.   DERMATOLOGIC: skin color, texture, and turgor are within normal limits.  No preulcerative lesions or ulcers  are seen, no interdigital maceration noted.  No open lesions present.  . No drainage noted.  NAILS  Marked incurvation medial border right great toenail.  Granulation tissue noted medial border right great toe.         Assessment & Plan:  Ingrown Toenail right foot  Paronychia medial border right great toe.   IE  Nail surgery.  Treatment options and alternatives discussed.  Recommended permanent phenol matrixectomy and patient agreed.  Right hallux was prepped with alcohol and a toe block of 3cc of 2% lidocaine plain was administered in a digital toe block. .  The toe was then prepped  with betadine solution .  The offending nail border was then excised and matrix tissue exposed.  Phenol was then applied to the matrix tissue followed by an alcohol wash.  Antibiotic ointment and a dry sterile dressing was applied.  The patient was dispensed instructions for aftercare. Cephalexin 500 mgt.  # 20. RTC 1 week.     Helane GuntherGregory Mayer DPM

## 2016-09-10 ENCOUNTER — Ambulatory Visit: Payer: Medicaid Other | Admitting: Podiatry

## 2016-09-11 ENCOUNTER — Ambulatory Visit: Payer: Medicaid Other | Admitting: Podiatry

## 2016-09-18 ENCOUNTER — Ambulatory Visit (INDEPENDENT_AMBULATORY_CARE_PROVIDER_SITE_OTHER): Payer: Medicaid Other | Admitting: Podiatry

## 2016-09-18 ENCOUNTER — Encounter: Payer: Self-pay | Admitting: Podiatry

## 2016-09-18 VITALS — BP 112/72 | HR 73 | Resp 12

## 2016-09-18 DIAGNOSIS — Z09 Encounter for follow-up examination after completed treatment for conditions other than malignant neoplasm: Secondary | ICD-10-CM

## 2016-09-18 DIAGNOSIS — L600 Ingrowing nail: Secondary | ICD-10-CM

## 2016-09-18 NOTE — Progress Notes (Signed)
   Subjective:    Patient ID: Carrie [name], female    DOB: 03-17-1991, 26 y.o.   MRN: [id number]  HPI patient presents the office with chief complaint of a painful big toenail, left foot.  She says the nail surgery for the correction ingrown toenail right big toe has healed.  She says she has been active and cleaned tissue at the surgical site.  She requests same procdure to be performed on left big toe.    Review of Systems  All other systems reviewed and are negative.      Objective:   Physical Exam GENERAL APPEARANCE: Alert, conversant. Appropriately groomed. No acute distress.  VASCULAR: Pedal pulses are  palpable at  [facility].  Capillary refill time is immediate to all digits,  Normal temperature gradient.  Digital hair growth is present bilateral  NEUROLOGIC: sensation is normal to 5.07 monofilament at 5/5 sites bilateral.  Light touch is intact bilateral, Muscle strength normal.  MUSCULOSKELETAL: acceptable muscle strength, tone and stability bilateral.  Intrinsic muscluature intact bilateral.  Rectus appearance of foot and digits noted bilateral.   DERMATOLOGIC: skin color, texture, and turgor are within normal limits.  No preulcerative lesions or ulcers  are seen, no interdigital maceration noted.  No open lesions present.  . No drainage noted.  NAILS Healing noted at right hallux surgical site.  Desquamation noted.  Left great toe has marked incurvation medial border left great toe.        Assessment & Plan:  Ingrown Toenail right foot  Healing.  Paronychia medial border left great toe..   IE  Nail surgery.  Treatment options and alternatives discussed.  Recommended permanent phenol matrixectomy and patient agreed.  Right hallux was prepped with alcohol and a toe block of 3cc of 2% lidocaine plain was administered in a digital toe block. .  The toe was then prepped with betadine solution .  The offending nail border was then excised and matrix tissue exposed.   Phenol was then applied to the matrix tissue followed by an alcohol wash.  Antibiotic ointment and a dry sterile dressing was applied.  The patient was dispensed instructions for aftercare. Her right hallux healing uneventfully.     Helane GuntherGregory Shamarr [name] DPM

## 2016-09-24 ENCOUNTER — Ambulatory Visit: Payer: Medicaid Other | Admitting: Podiatry

## 2016-10-14 IMAGING — US US MFM OB FOLLOW-UP
1 series · 14 of 28 positions shown · non-contrast
Comparison: none

[Series 1: us mfm ob follow-up · 41 acquisitions, 14 frames shown]
[im 2/41]
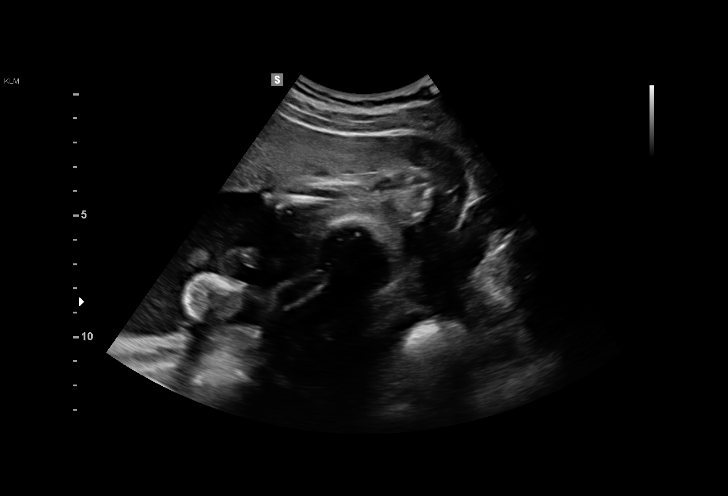
[im 5/41]
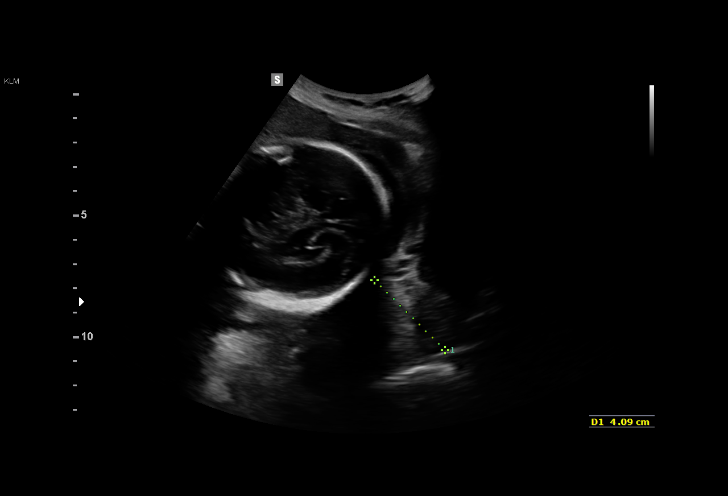
[im 8/41]
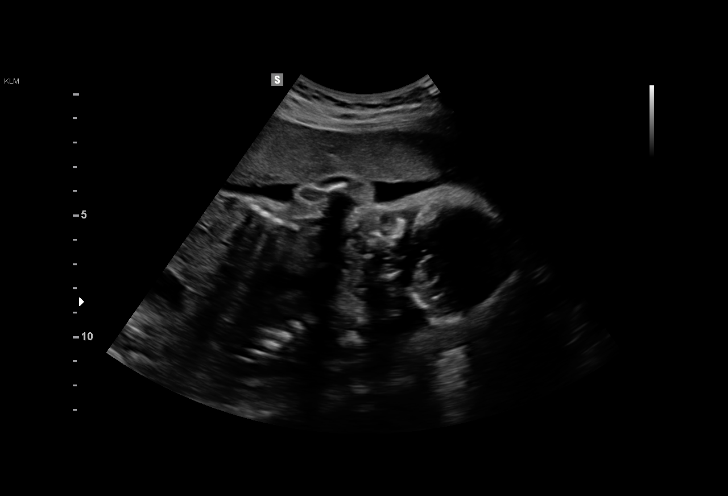
[im 11/41]
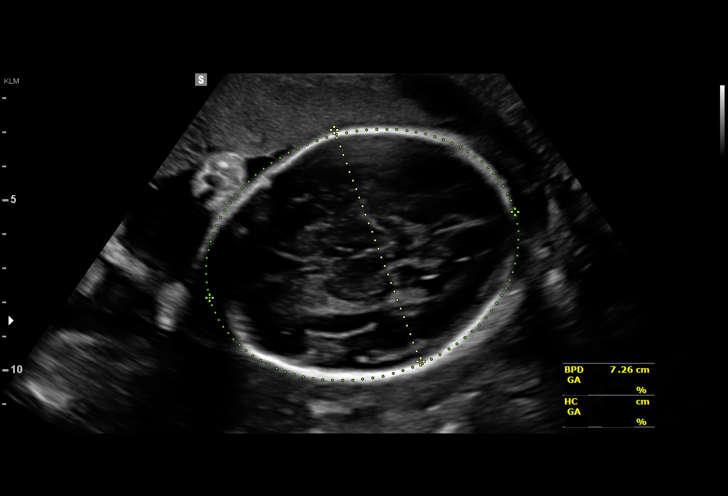
[im 14/41]
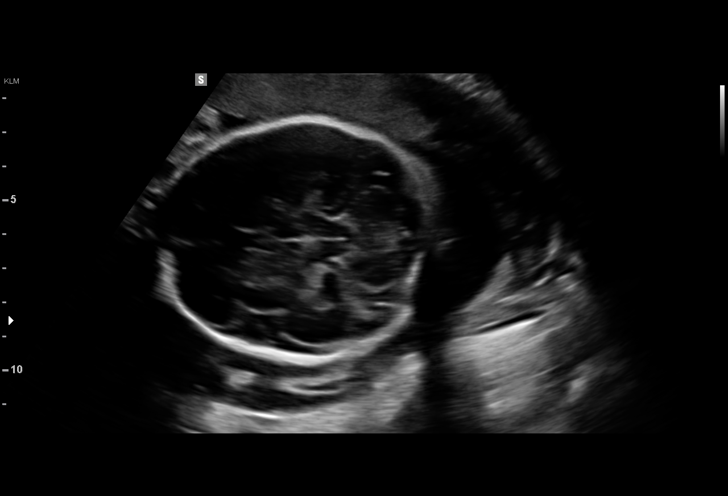
[im 17/41]
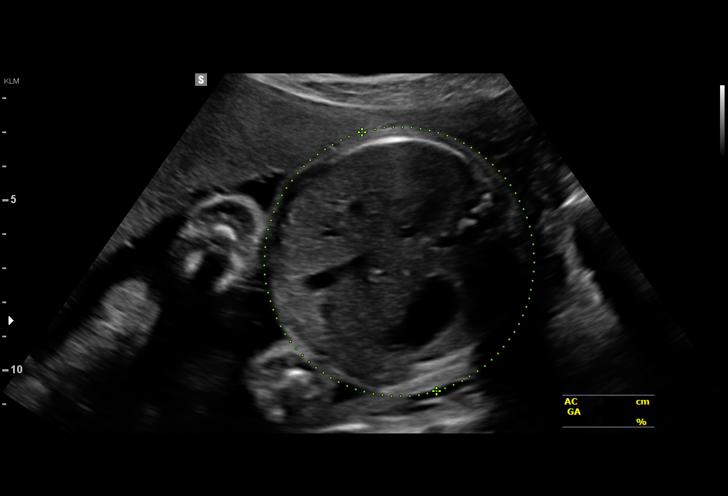
[im 20/41]
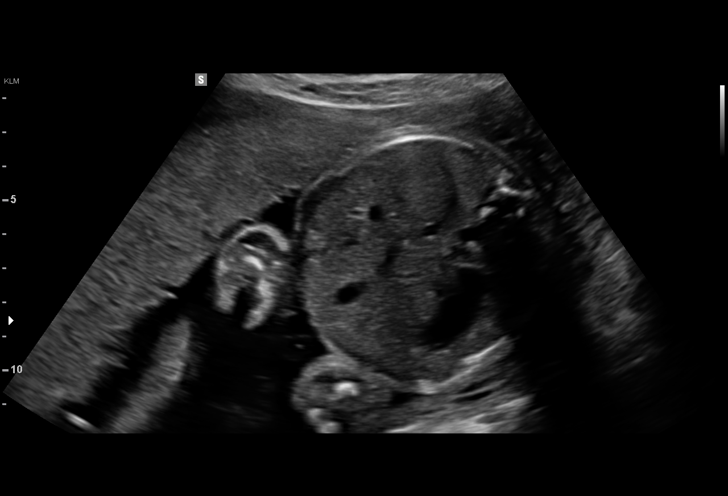
[im 23/41]
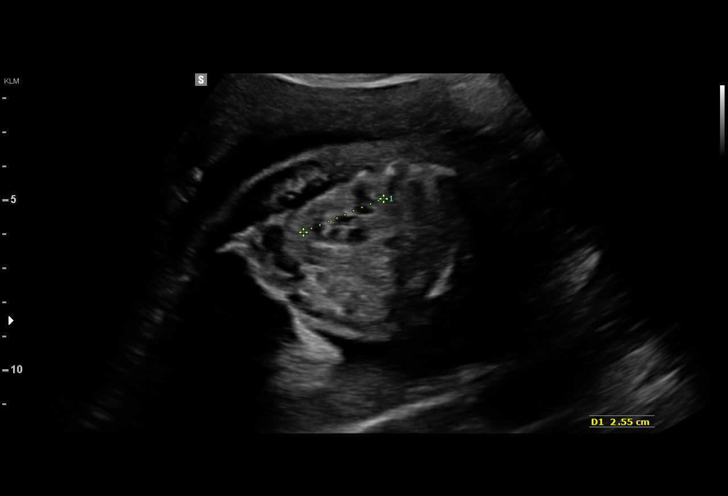
[im 26/41]
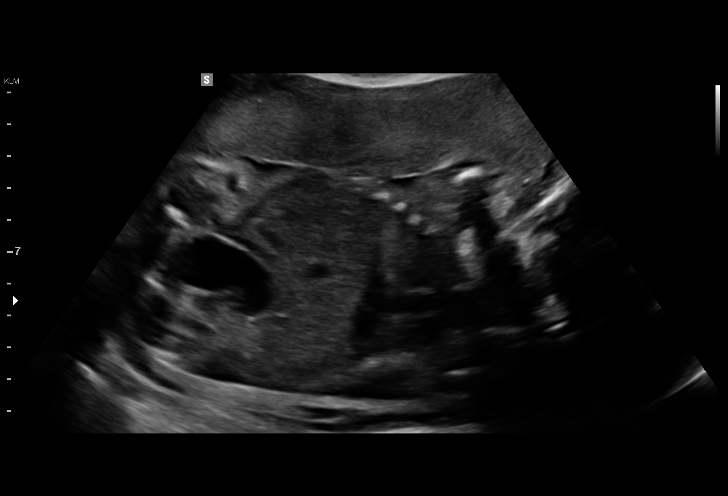
[im 29/41]
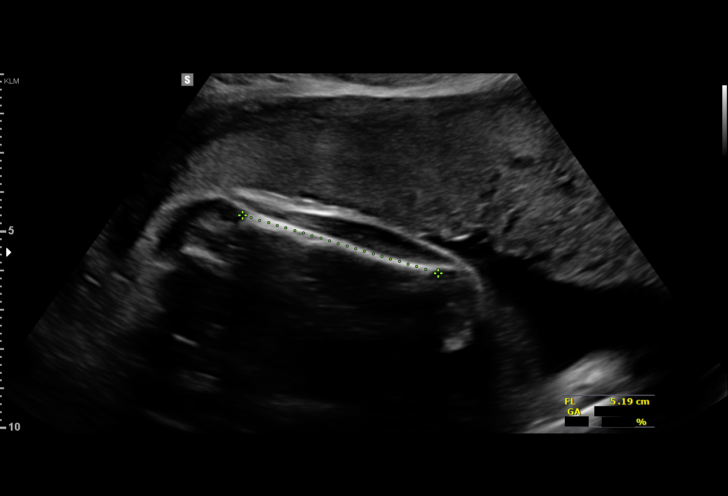
[im 32/41]
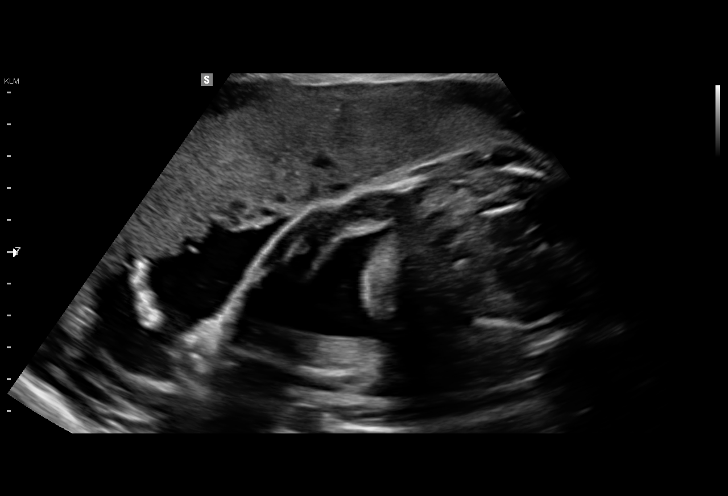
[im 35/41]
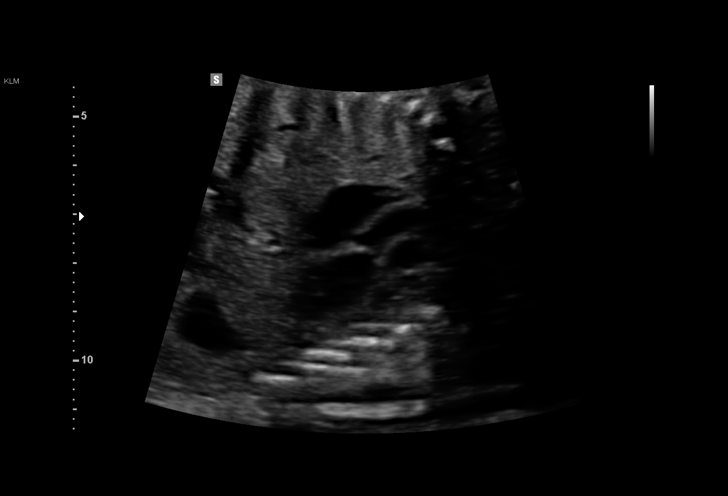
[im 38/41]
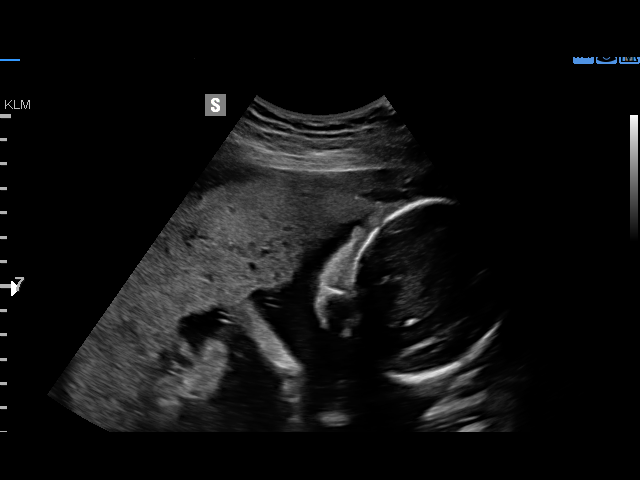
[im 41/41]
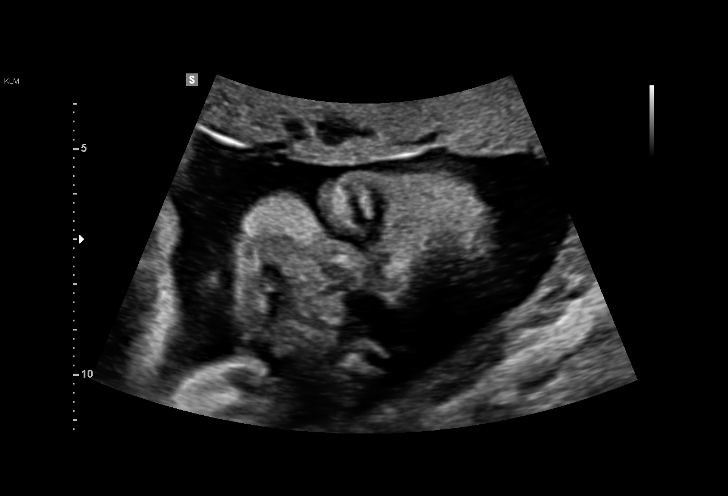

[14 of 28 positions shown; findings below may reference images not displayed]

pm)

Hospital Clinic-
Faculty Physician
OB/Gyn Clinic
Ref. Address:     [REDACTED]

1  [facility]            [id number]      [id number]     [id number]
Indications

27 weeks gestation of pregnancy
Fetal abnormality - other known or suspected
OB History

Gravidity:    3         Term:   0        Prem:   0        SAB:   0
TOP:          1       Ectopic:  1        Living: 0
Fetal Evaluation

Num Of Fetuses:     1
Fetal Heart         136
Rate(bpm):
Cardiac Activity:   Observed
Presentation:       Cephalic
Placenta:           Anterior, above cervical os
P. Cord Insertion:  Previously Visualized

Amniotic Fluid
AFI FV:      Subjectively within normal limits

Largest Pocket(cm)
5.2
Biometry

BPD:      72.3  mm     G. Age:  29w 0d         89  %    CI:         76.7   %   70 - 86
FL/HC:      19.8   %   18.6 -
HC:      261.5  mm     G. Age:  28w 3d         59  %    HC/AC:      1.05       1.05 -
AC:      248.3  mm     G. Age:  29w 0d         88  %    FL/BPD:     71.6   %   71 - 87
FL:       51.8  mm     G. Age:  27w 4d         46  %    FL/AC:      20.9   %   20 - 24

Est. FW:    3111  gm    2 lb 12 oz      76  %
Gestational Age

LMP:           27w 2d       Date:   06/27/15                 EDD:   04/02/16
U/S Today:     28w 4d                                        EDD:   03/24/16
Best:          27w 2d    Det. By:   LMP  (06/27/15)          EDD:   04/02/16
Anatomy

Cranium:               Appears normal         Aortic Arch:            Previously seen
Cavum:                 Appears normal         Ductal Arch:            Previously seen
Ventricles:            Appears normal         Diaphragm:              Appears normal
Choroid Plexus:        Previously seen        Stomach:                Appears normal, left
sided
Cerebellum:            Previously seen        Abdomen:                Appears normal
Posterior Fossa:       Previously seen        Abdominal Wall:         Previously seen
Nuchal Fold:           Previously seen        Cord Vessels:           Previously seen
Face:                  Orbits and profile     Kidneys:                Appear normal
previously seen
Lips:                  Previously seen        Bladder:                Appears normal
Thoracic:              Appears normal         Spine:                  Previously seen
Heart:                 Appears normal         Upper Extremities:      Appears normal
(4CH, axis, and situs
RVOT:                  Previously seen        Lower Extremities:      Previously seen
LVOT:                  Appears normal

Other:  Female gender previously screen. Nasal bone, Heel and 5th digit
previously visualized. Technically difficult due to fetal position.
Cervix Uterus Adnexa

Cervix
Length:            4.1  cm.
Normal appearance by transabdominal scan.
Impression

SIUP at 27+2 weeks
Normal interval anatomy; anatomic survey complete; upper
extremities moved normally today
Normal amniotic fluid volume
Appropriate interval growth with EFW at the 76th %tile
Recommendations

Follow-up as clinically indicated

## 2017-01-16 IMAGING — US US MFM OB LIMITED
1 series · 15 of 21 positions shown · non-contrast
Comparison: none

[Series 1: us mfm ob limited · 21 acquisitions, 15 frames shown]
[im 1/21]
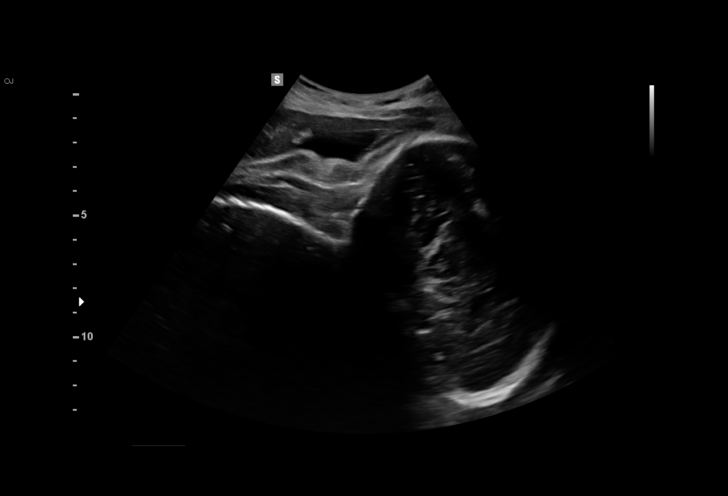
[im 3/21]
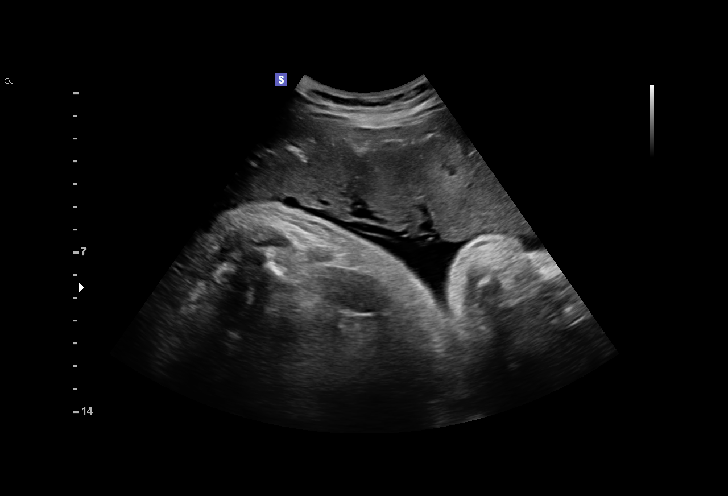
[im 4/21]
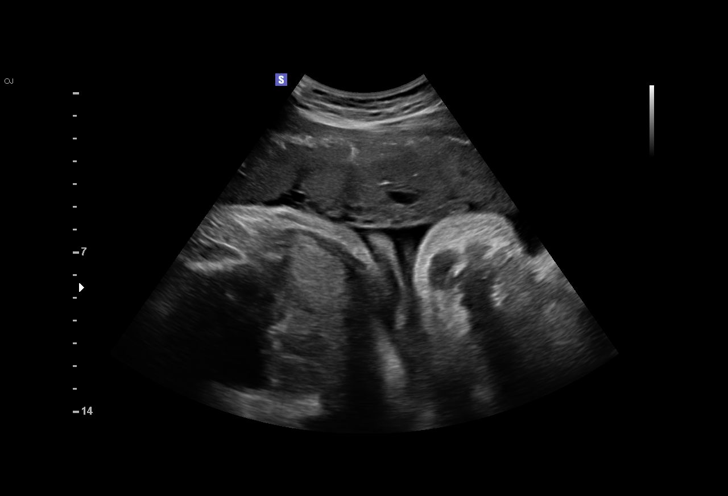
[im 5/21]
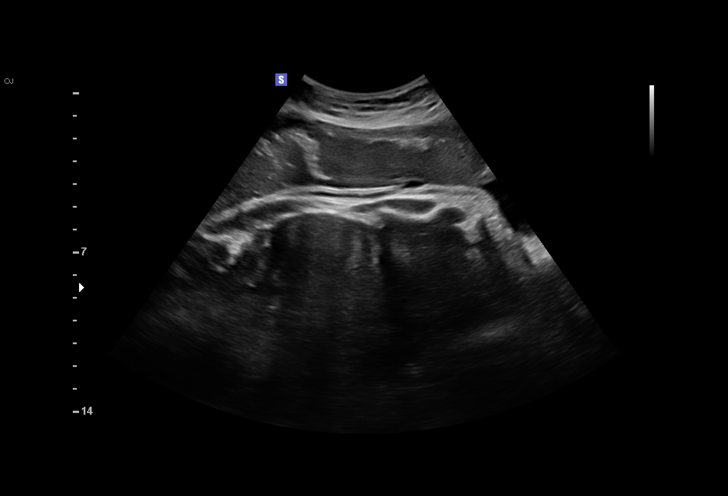
[im 7/21]
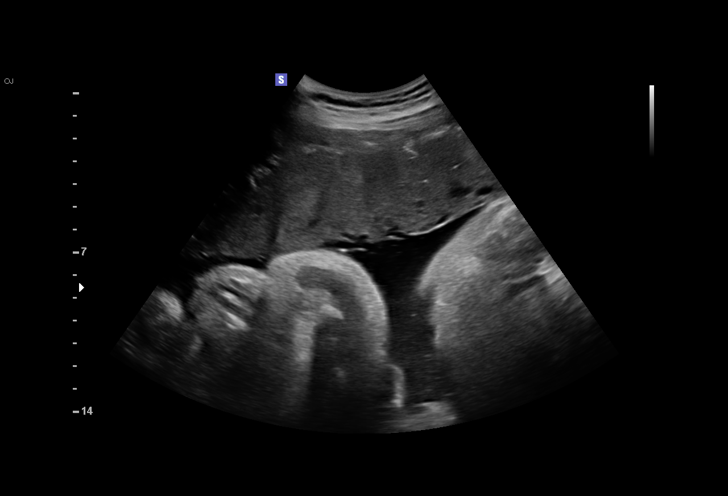
[im 8/21]
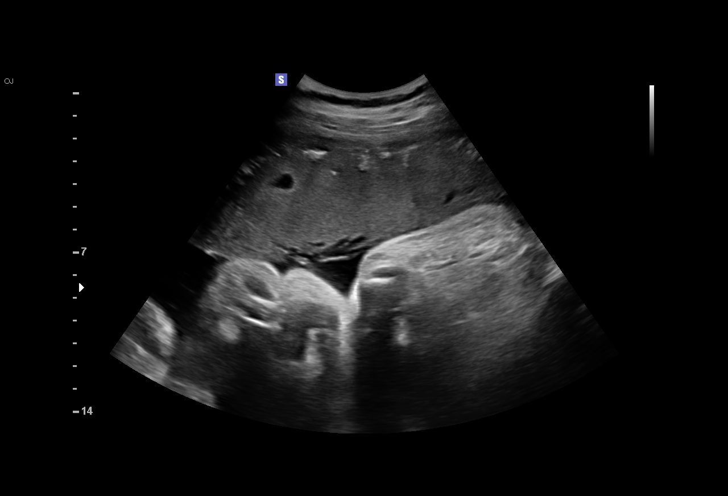
[im 10/21]
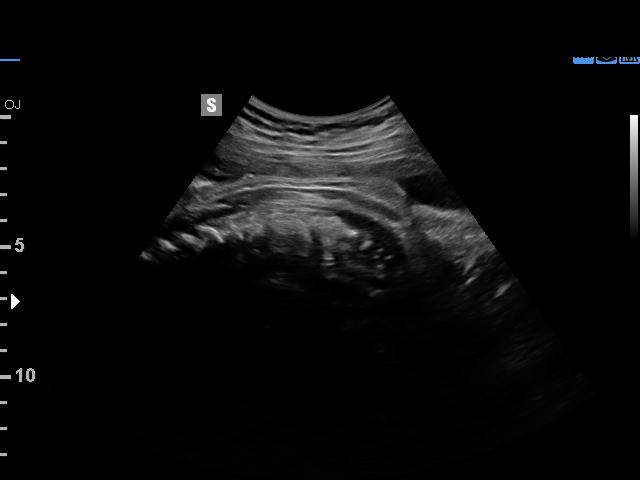
[im 11/21]
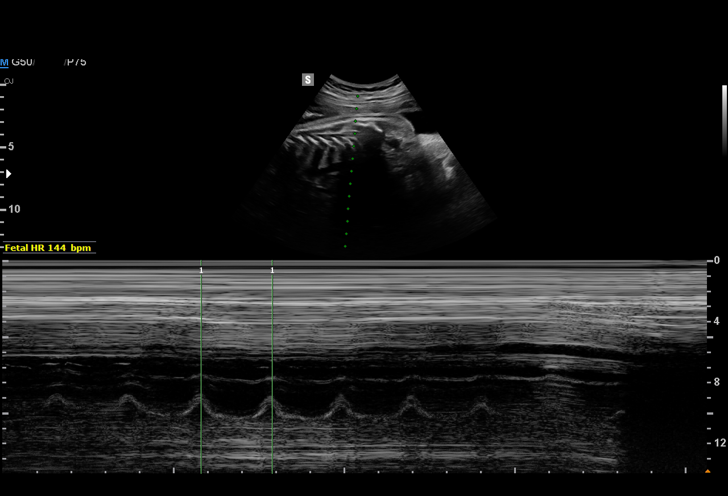
[im 12/21]
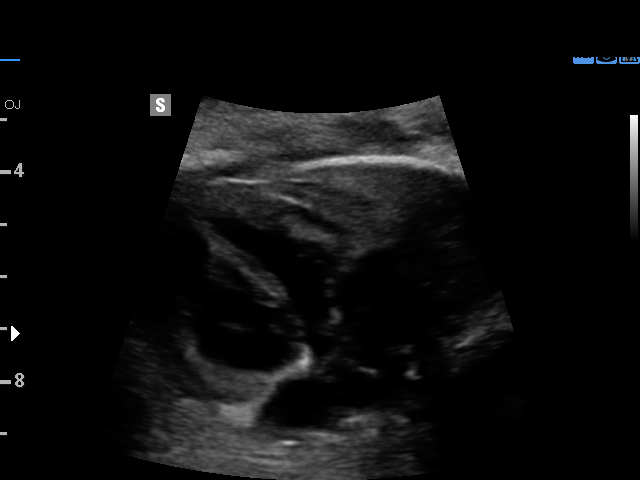
[im 14/21]
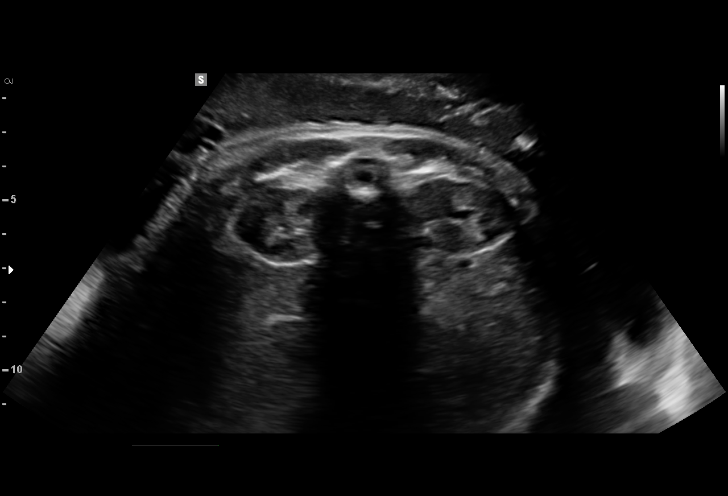
[im 15/21]
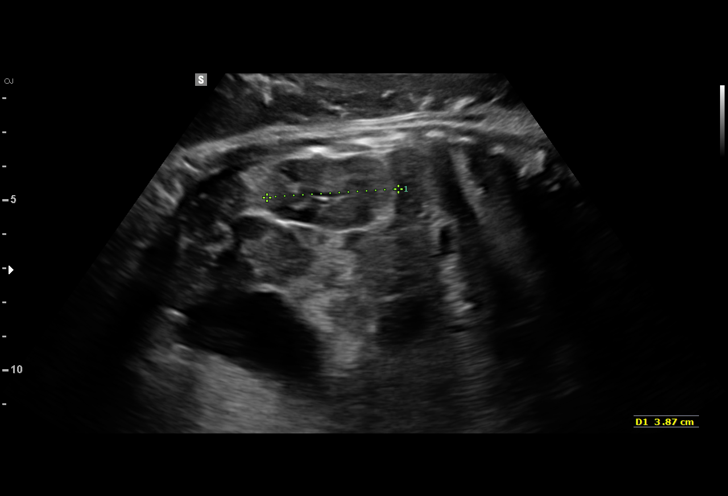
[im 17/21]
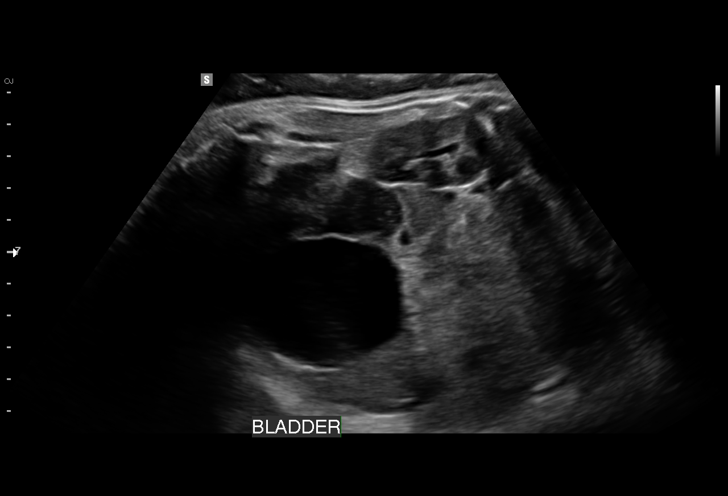
[im 18/21]
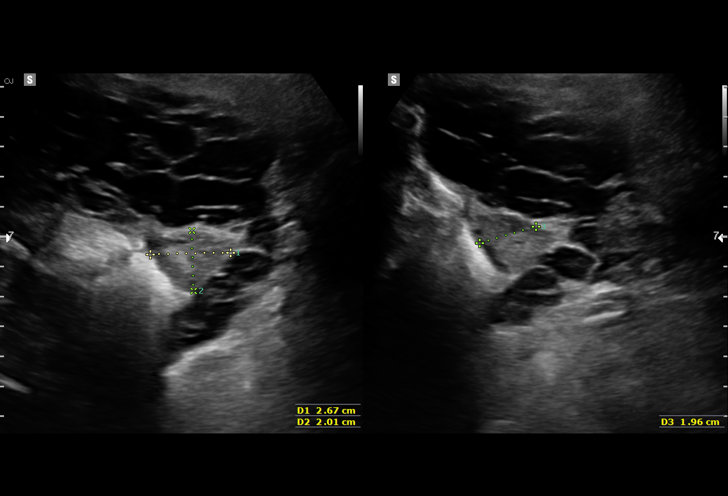
[im 19/21]
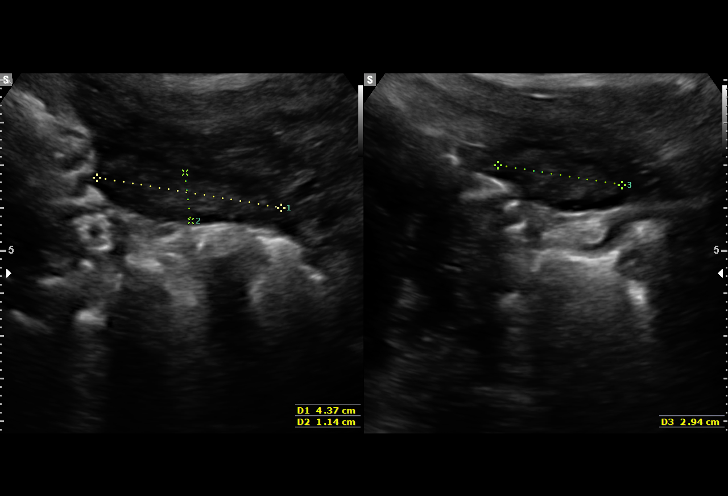
[im 21/21]
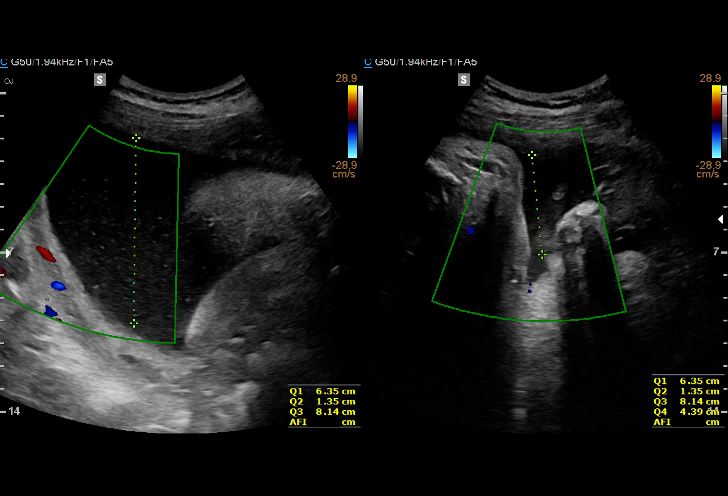

[15 of 21 positions shown; findings below may reference images not displayed]

OB/Gyn Clinic
Ref. Address:     [REDACTED]

1  AR[facility] CARVER            [id number]      [id number]     [id number]
Indications

40 weeks gestation of pregnancy
Postdate pregnancy (40-42 weeks)
OB History

Gravidity:    3         Term:   0        Prem:   0        SAB:   0
TOP:          1       Ectopic:  1        Living: 0
Fetal Evaluation

Num Of Fetuses:     1
Fetal Heart         144
Rate(bpm):
Cardiac Activity:   Observed
Presentation:       Cephalic
Placenta:           Anterior, above cervical os

Amniotic Fluid
AFI FV:      Subjectively within normal limits

AFI Sum(cm)     %Tile       Largest Pocket(cm)
20.23           95
RUQ(cm)       RLQ(cm)       LUQ(cm)        LLQ(cm)
6.35
Gestational Age

LMP:           40w 5d       Date:   06/27/15                 EDD:   04/02/16
Best:          40w 5d    Det. By:   LMP  (06/27/15)          EDD:   04/02/16
Impression

SIUP at 40+5 weeks
Cephalic presentation
High normal amniotic fluid volume
Recommendations

Follow-up ultrasounds as clinically indicated.

## 2017-04-06 ENCOUNTER — Other Ambulatory Visit: Payer: Self-pay | Admitting: Family Medicine

## 2017-09-26 DIAGNOSIS — S93401A Sprain of unspecified ligament of right ankle, initial encounter: Secondary | ICD-10-CM | POA: Diagnosis not present

## 2017-10-04 ENCOUNTER — Other Ambulatory Visit: Payer: Self-pay | Admitting: Family Medicine

## 2017-10-07 ENCOUNTER — Telehealth: Payer: Self-pay | Admitting: General Practice

## 2017-10-07 NOTE — Telephone Encounter (Signed)
Called and notified patient of appointment with Dr. Adrian BlackwaterStinson.  Patient voiced understanding.

## 2017-11-09 ENCOUNTER — Encounter: Payer: Self-pay | Admitting: Family Medicine

## 2017-11-09 ENCOUNTER — Ambulatory Visit: Payer: Self-pay | Admitting: Family Medicine

## 2017-11-24 ENCOUNTER — Encounter: Payer: Self-pay | Admitting: *Deleted

## 2018-02-03 DIAGNOSIS — L600 Ingrowing nail: Secondary | ICD-10-CM | POA: Diagnosis not present

## 2018-03-04 DIAGNOSIS — L03031 Cellulitis of right toe: Secondary | ICD-10-CM | POA: Diagnosis not present

## 2018-05-12 DIAGNOSIS — L600 Ingrowing nail: Secondary | ICD-10-CM | POA: Diagnosis not present

## 2018-06-07 DIAGNOSIS — F439 Reaction to severe stress, unspecified: Secondary | ICD-10-CM | POA: Diagnosis not present

## 2018-06-07 DIAGNOSIS — F411 Generalized anxiety disorder: Secondary | ICD-10-CM | POA: Diagnosis not present

## 2018-06-07 DIAGNOSIS — F502 Bulimia nervosa: Secondary | ICD-10-CM | POA: Diagnosis not present

## 2018-06-07 DIAGNOSIS — Z5181 Encounter for therapeutic drug level monitoring: Secondary | ICD-10-CM | POA: Diagnosis not present

## 2018-08-21 ENCOUNTER — Encounter (HOSPITAL_COMMUNITY): Payer: Self-pay

## 2018-08-21 ENCOUNTER — Inpatient Hospital Stay (HOSPITAL_COMMUNITY)
Admission: AD | Admit: 2018-08-21 | Discharge: 2018-08-21 | Disposition: A | Discharge status: Home / Self Care | Payer: Medicaid Other | Source: Ambulatory Visit | Attending: Obstetrics & Gynecology | Admitting: Obstetrics & Gynecology

## 2018-08-21 ENCOUNTER — Other Ambulatory Visit: Payer: Self-pay

## 2018-08-21 DIAGNOSIS — R109 Unspecified abdominal pain: Secondary | ICD-10-CM | POA: Diagnosis present

## 2018-08-21 DIAGNOSIS — R1030 Lower abdominal pain, unspecified: Secondary | ICD-10-CM

## 2018-08-21 DIAGNOSIS — Z3202 Encounter for pregnancy test, result negative: Secondary | ICD-10-CM | POA: Diagnosis not present

## 2018-08-21 LAB — URINALYSIS, ROUTINE W REFLEX MICROSCOPIC
BILIRUBIN URINE: NEGATIVE
GLUCOSE, UA: NEGATIVE mg/dL
HGB URINE DIPSTICK: NEGATIVE
KETONES UR: NEGATIVE mg/dL
Leukocytes, UA: NEGATIVE
NITRITE: NEGATIVE
PH: 6 (ref 5.0–8.0)
Protein, ur: NEGATIVE mg/dL
SPECIFIC GRAVITY, URINE: 1.016 (ref 1.005–1.030)

## 2018-08-21 LAB — WET PREP, GENITAL
Clue Cells Wet Prep HPF POC: NONE SEEN
Sperm: NONE SEEN
Trich, Wet Prep: NONE SEEN
Yeast Wet Prep HPF POC: NONE SEEN

## 2018-08-21 LAB — CBC
HCT: 42.6 % (ref 36.0–46.0)
HEMOGLOBIN: 13.9 g/dL (ref 12.0–15.0)
MCH: 30.5 pg (ref 26.0–34.0)
MCHC: 32.6 g/dL (ref 30.0–36.0)
MCV: 93.4 fL (ref 80.0–100.0)
NRBC: 0 % (ref 0.0–0.2)
Platelets: 241 10*3/uL (ref 150–400)
RBC: 4.56 MIL/uL (ref 3.87–5.11)
RDW: 13 % (ref 11.5–15.5)
WBC: 5.3 10*3/uL (ref 4.0–10.5)

## 2018-08-21 LAB — POCT PREGNANCY, URINE: PREG TEST UR: NEGATIVE

## 2018-08-21 NOTE — MAU Note (Addendum)
Have had abdominal pain since Monday. Have tried gas meds and not helping. Lower abd pain. LMP 12/20. Does have hx of ectopic tx with MTX. No birth control. Has hx ovarian cysts. Normal BMs and had one today. Dizziness and nausea at times

## 2018-08-21 NOTE — Discharge Instructions (Signed)
Abdominal Pain, Adult  Abdominal pain can be caused by many things. Often, abdominal pain is not serious and it gets better with no treatment or by being treated at home. However, sometimes abdominal pain is serious. Your health care provider will do a medical history and a physical exam to try to determine the cause of your abdominal pain.  Follow these instructions at home:   Take over-the-counter and prescription medicines only as told by your health care provider. Do not take a laxative unless told by your health care provider.   Drink enough fluid to keep your urine clear or pale yellow.   Watch your condition for any changes.   Keep all follow-up visits as told by your health care provider. This is important.  Contact a health care provider if:   Your abdominal pain changes or gets worse.   You are not hungry or you lose weight without trying.   You are constipated or have diarrhea for more than 2-3 days.   You have pain when you urinate or have a bowel movement.   Your abdominal pain wakes you up at night.   Your pain gets worse with meals, after eating, or with certain foods.   You are throwing up and cannot keep anything down.   You have a fever.  Get help right away if:   Your pain does not go away as soon as your health care provider told you to expect.   You cannot stop throwing up.   Your pain is only in areas of the abdomen, such as the right side or the left lower portion of the abdomen.   You have bloody or black stools, or stools that look like tar.   You have severe pain, cramping, or bloating in your abdomen.   You have signs of dehydration, such as:  ? Dark urine, very little urine, or no urine.  ? Cracked lips.  ? Dry mouth.  ? Sunken eyes.  ? Sleepiness.  ? Weakness.  This information is not intended to replace advice given to you by your health care provider. Make sure you discuss any questions you have with your health care provider.  Document Released: 05/21/2005 Document  Revised: 02/29/2016 Document Reviewed: 01/23/2016  Elsevier Interactive Patient Education  2019 [employer] Inc[employer]

## 2018-08-21 NOTE — MAU Provider Note (Signed)
History     CSN: [id number]  Arrival date and time: 08/21/18 [id number]   First Provider Initiated Contact with Patient 08/21/18 1943      Chief Complaint  Patient presents with  . Abdominal Pain   27 y.o. female here with LAP. Pain started 5 days ago. Describes as sharp and cramping, bilateral, and intermittent. She thought it was gas and used OTC products for that which did not help. Rates pain 8/10. Reports feeling hot and cold but no fever when she checked. Associated sx are nausea and poor appetite. Had BM tonight. Denies vaginal discharge. No urinary sx. LMP was 12/20. Hx of ectopic and the pain is similar. No new partner. No hx of STDs.   Past Medical History:  Diagnosis Date  . Anxiety    hx of meds  . Heart murmur    at birth  . Infection    UTI    Past Surgical History:  Procedure Laterality Date  . NO PAST SURGERIES    . WISDOM TOOTH EXTRACTION      Family History  Problem Relation Age of Onset  . Hypertension Father   . Hyperlipidemia Father   . Deep vein thrombosis Father        turning into a PE  . Diabetes Maternal Grandfather   . Cancer Paternal Grandmother   . Cancer Paternal Grandfather   . Stroke Paternal Grandfather   . Heart disease Paternal Grandfather   . Hearing loss Neg Hx     Social History   Tobacco Use  . Smoking status: Never Smoker  . Smokeless tobacco: Former Engineer, waterUser  Substance Use Topics  . Alcohol use: No  . Drug use: No    Allergies:  Allergies  Allergen Reactions  . Codeine Nausea Only    Raises body temperature    Medications Prior to Admission  Medication Sig Dispense Refill Last Dose  . BLISOVI 24 FE 1-20 MG-MCG(24) tablet TAKE 1 TABLET BY MOUTH ONCE DAILY 28 tablet 11   . busPIRone (BUSPAR) 10 MG tablet Take 1 tablet (10 mg total) by mouth 2 (two) times daily. (Patient not taking: Reported on 09/03/2016) 180 tablet 1 Not Taking  . cephALEXin (KEFLEX) 500 MG capsule Take 1 capsule (500 mg total) by mouth 2 (two) times  daily. 15 capsule 0   . FLUoxetine (PROZAC) 20 MG capsule Take 1 capsule (20 mg total) by mouth daily. 90 capsule 1 Taking  . FLUoxetine (PROZAC) 20 MG capsule TAKE 1 CAPSULE(20 MG) BY MOUTH DAILY (Patient not taking: Reported on 09/03/2016) 30 capsule 0 Not Taking    Review of Systems  Constitutional: Positive for appetite change. Negative for chills and fever.  Gastrointestinal: Positive for abdominal pain and nausea. Negative for constipation, diarrhea and vomiting.  Genitourinary: Negative for dysuria, frequency, hematuria, vaginal bleeding and vaginal discharge.  Musculoskeletal: Negative for back pain.   Physical Exam   Blood pressure 105/72, pulse 62, temperature 97.7 F (36.5 C), resp. rate 18, height 5\' 5"  (1.651 m), weight 71.2 kg, last menstrual period 08/13/2018, SpO2 100 %.  Physical Exam  Nursing note and vitals reviewed. Constitutional: She is oriented to person, place, and time. She appears well-developed and well-nourished. No distress (appears comfortable).  HENT:  Head: Normocephalic and atraumatic.  Neck: Normal range of motion.  Cardiovascular: Normal rate.  Respiratory: Effort normal. No respiratory distress.  GI: Soft. She exhibits no distension and no mass. There is no abdominal tenderness. There is no rebound and no guarding.  Genitourinary:       Genitourinary Comments: Vagina: rugated, pink, moist, thin white discharge Uterus: non enlarged, anteverted, non tender, no CMT Adnexae: no masses, no tenderness left, no tenderness right Cervix nml    Musculoskeletal: Normal range of motion.  Neurological: She is alert and oriented to person, place, and time.  Skin: Skin is warm and dry.  Psychiatric: She has a normal mood and affect.   Results for orders placed or performed during the hospital encounter of 08/21/18 (from the past 24 hour(s))  Urinalysis, Routine w reflex microscopic     Status: Abnormal   Collection Time: 08/21/18  7:53 PM  Result Value  Ref Range   Color, Urine STRAW (A) YELLOW   APPearance HAZY (A) CLEAR   Specific Gravity, Urine 1.016 1.005 - 1.030   pH 6.0 5.0 - 8.0   Glucose, UA NEGATIVE NEGATIVE mg/dL   Hgb urine dipstick NEGATIVE NEGATIVE   Bilirubin Urine NEGATIVE NEGATIVE   Ketones, ur NEGATIVE NEGATIVE mg/dL   Protein, ur NEGATIVE NEGATIVE mg/dL   Nitrite NEGATIVE NEGATIVE   Leukocytes, UA NEGATIVE NEGATIVE  Pregnancy, urine POC     Status: None   Collection Time: 08/21/18  7:56 PM  Result Value Ref Range   Preg Test, Ur NEGATIVE NEGATIVE  Wet prep, genital     Status: Abnormal   Collection Time: 08/21/18  7:58 PM  Result Value Ref Range   Yeast Wet Prep HPF POC NONE SEEN NONE SEEN   Trich, Wet Prep NONE SEEN NONE SEEN   Clue Cells Wet Prep HPF POC NONE SEEN NONE SEEN   WBC, Wet Prep HPF POC MODERATE (A) NONE SEEN   Sperm NONE SEEN   CBC     Status: None   Collection Time: 08/21/18  8:13 PM  Result Value Ref Range   WBC 5.3 4.0 - 10.5 K/uL   RBC 4.56 3.87 - 5.11 MIL/uL   Hemoglobin 13.9 12.0 - 15.0 g/dL   HCT 40.942.6 81.136.0 - 91.446.0 %   MCV 93.4 80.0 - 100.0 fL   MCH 30.5 26.0 - 34.0 pg   MCHC 32.6 30.0 - 36.0 g/dL   RDW 78.213.0 95.611.5 - 21.315.5 %   Platelets 241 150 - 400 K/uL   nRBC 0.0 0.0 - 0.2 %   MAU Course  Procedures  MDM Offered pain med but she declined. No evidence of pregnancy or acute abdominal or pelvic process. Sx may be GI such as IBS, unlikely GYN etiology. Stable for discharge home.   Assessment and Plan   1. Lower abdominal pain    Discharge home Follow up with PCP if sx persist Return precautions  Allergies as of 08/21/2018      Reactions   Codeine Nausea Only   Raises body temperature      Medication List    STOP taking these medications   BLISOVI 24 FE 1-20 MG-MCG(24) tablet Generic drug:  Norethindrone Acetate-Ethinyl Estrad-FE   busPIRone 10 MG tablet Commonly known as:  BUSPAR   cephALEXin 500 MG capsule Commonly known as:  KEFLEX     TAKE these medications     FLUoxetine 20 MG capsule Commonly known as:  PROZAC Take 1 capsule (20 mg total) by mouth daily. What changed:  Another medication with the same name was removed. Continue taking this medication, and follow the directions you see here.      Donette LarryMelanie Tyshea Imel, CNM 08/21/2018, 8:37 PM

## 2018-08-23 LAB — GC/CHLAMYDIA PROBE AMP (~~LOC~~) NOT AT ARMC
Chlamydia: NEGATIVE
Neisseria Gonorrhea: NEGATIVE

## 2018-08-23 LAB — GLUCOSE, CAPILLARY: Glucose-Capillary: 76 mg/dL (ref 70–99)

## 2018-09-06 DIAGNOSIS — F411 Generalized anxiety disorder: Secondary | ICD-10-CM | POA: Diagnosis not present

## 2018-10-02 DIAGNOSIS — H10023 Other mucopurulent conjunctivitis, bilateral: Secondary | ICD-10-CM | POA: Diagnosis not present

## 2018-10-02 DIAGNOSIS — J069 Acute upper respiratory infection, unspecified: Secondary | ICD-10-CM | POA: Diagnosis not present

## 2018-11-01 DIAGNOSIS — F411 Generalized anxiety disorder: Secondary | ICD-10-CM | POA: Diagnosis not present

## 2018-12-06 DIAGNOSIS — F411 Generalized anxiety disorder: Secondary | ICD-10-CM | POA: Diagnosis not present

## 2019-01-03 DIAGNOSIS — F411 Generalized anxiety disorder: Secondary | ICD-10-CM | POA: Diagnosis not present

## 2019-01-31 DIAGNOSIS — F411 Generalized anxiety disorder: Secondary | ICD-10-CM | POA: Diagnosis not present

## 2019-03-14 DIAGNOSIS — H5203 Hypermetropia, bilateral: Secondary | ICD-10-CM | POA: Diagnosis not present

## 2019-03-14 DIAGNOSIS — H52223 Regular astigmatism, bilateral: Secondary | ICD-10-CM | POA: Diagnosis not present

## 2019-04-04 DIAGNOSIS — F411 Generalized anxiety disorder: Secondary | ICD-10-CM | POA: Diagnosis not present

## 2019-04-20 DIAGNOSIS — Z20828 Contact with and (suspected) exposure to other viral communicable diseases: Secondary | ICD-10-CM | POA: Diagnosis not present

## 2019-05-04 DIAGNOSIS — Z20828 Contact with and (suspected) exposure to other viral communicable diseases: Secondary | ICD-10-CM | POA: Diagnosis not present

## 2019-05-09 DIAGNOSIS — F411 Generalized anxiety disorder: Secondary | ICD-10-CM | POA: Diagnosis not present

## 2019-06-06 DIAGNOSIS — F411 Generalized anxiety disorder: Secondary | ICD-10-CM | POA: Diagnosis not present

## 2019-07-04 DIAGNOSIS — F411 Generalized anxiety disorder: Secondary | ICD-10-CM | POA: Diagnosis not present

## 2019-08-08 DIAGNOSIS — F411 Generalized anxiety disorder: Secondary | ICD-10-CM | POA: Diagnosis not present

## 2019-09-05 DIAGNOSIS — F411 Generalized anxiety disorder: Secondary | ICD-10-CM | POA: Diagnosis not present

## 2019-09-10 ENCOUNTER — Other Ambulatory Visit: Payer: Self-pay

## 2019-09-10 ENCOUNTER — Inpatient Hospital Stay (HOSPITAL_COMMUNITY)
Admission: AD | Admit: 2019-09-10 | Discharge: 2019-09-10 | Disposition: A | Discharge status: Home / Self Care | Payer: Medicaid Other | Attending: Obstetrics and Gynecology | Admitting: Obstetrics and Gynecology

## 2019-09-10 DIAGNOSIS — N939 Abnormal uterine and vaginal bleeding, unspecified: Secondary | ICD-10-CM

## 2019-09-10 DIAGNOSIS — Z9889 Other specified postprocedural states: Secondary | ICD-10-CM

## 2019-09-10 LAB — POCT PREGNANCY, URINE: Preg Test, Ur: NEGATIVE

## 2019-09-10 NOTE — Discharge Instructions (Signed)
[facility] Prenatal Care Providers °         °[facility] locations:  °Hours may vary. Please call for an appointment ° °[facility] @ [place] ° [address]  °[phone] ° °[facility] @ [facility]  ° [address]  °[phone] ° °[facility] @ [place]      ° [address] °[phone]   °         °[facility] @ [place]    ° [address] °[phone] °         °[facility] @ [place]  ° [address] °[phone] ° °[facility] @ [facility] ° [address] °[phone] °    °[facility] ° [address]  ° [phone] ° °

## 2019-09-10 NOTE — MAU Note (Signed)
Carrie [name] is a 29 y.o. here in MAU reporting: took cytotec on Dec 12 for TAB and states everything seemed fine. Was put on sprintec on Dec 13. States she stopped taking it on Dec 18 due to lots of vomiting. States bleeding subsided with the birth control but when she stopped taking it she started bleeding again. States bleeding is heavier today, it is like a period. Started with cramping last night and took some tylenol.   Onset of complaint: ongoing  Pain score: 4/10  Vitals:   09/10/19 1429  BP: 107/77  Pulse: 75  Resp: 16  Temp: 98.3 F (36.8 C)  SpO2: 100%     Lab orders placed from triage: UPT

## 2019-09-10 NOTE — MAU Provider Note (Signed)
First Provider Initiated Contact with Patient 09/10/19 1434      S [name] is a 29 y.o. [phone] non-pregnant female who presents to MAU today with complaint of vaginal bleeding. She had a TAB in December, started OCPs after, stopped OCPs a few weeks after starting and has been bleeding since.    O BP 107/77 (BP Location: Right Arm)   Pulse 75   Temp 98.3 F (36.8 C) (Oral)   Resp 16   Ht 5\' 5"  (1.651 m)   Wt 75.1 kg   SpO2 100%   BMI 27.54 kg/m  Physical Exam  Nursing note and vitals reviewed. Constitutional: She is oriented to person, place, and time. She appears well-developed and well-nourished. No distress.  HENT:  Head: Normocephalic.  Eyes: Pupils are equal, round, and reactive to light.  Cardiovascular: Normal rate, regular rhythm and normal heart sounds.  Respiratory: Effort normal and breath sounds normal. No respiratory distress.  GI: Soft. Bowel sounds are normal. She exhibits no distension. There is no abdominal tenderness.  Neurological: She is alert and oriented to person, place, and time.  Skin: Skin is warm and dry.  Psychiatric: She has a normal mood and affect. Her behavior is normal. Judgment and thought content normal.   Results for orders placed or performed during the hospital encounter of 09/10/19 (from the past 24 hour(s))  Pregnancy, urine POC     Status: None   Collection Time: 09/10/19  2:16 PM  Result Value Ref Range   Preg Test, Ur NEGATIVE NEGATIVE   A Non pregnant female Medical screening exam complete  P Discharge from MAU in stable condition Patient given the option of transfer to [facility] for further evaluation or seek care in outpatient facility of choice List of options for follow-up given  Warning signs for worsening condition that would warrant emergency follow-up discussed Patient may return to MAU as needed for pregnancy related complaints  [facility], CNM 09/10/2019 2:50 PM

## 2019-09-19 ENCOUNTER — Ambulatory Visit (HOSPITAL_COMMUNITY): Payer: Medicaid Other

## 2019-09-29 ENCOUNTER — Other Ambulatory Visit: Payer: Self-pay

## 2019-09-29 ENCOUNTER — Other Ambulatory Visit (HOSPITAL_COMMUNITY)
Admission: RE | Admit: 2019-09-29 | Discharge: 2019-09-29 | Disposition: A | Discharge status: Home / Self Care | Payer: Medicaid Other | Source: Ambulatory Visit | Attending: Obstetrics and Gynecology | Admitting: Obstetrics and Gynecology

## 2019-09-29 ENCOUNTER — Ambulatory Visit: Payer: Medicaid Other | Admitting: Obstetrics and Gynecology

## 2019-09-29 ENCOUNTER — Encounter: Payer: Self-pay | Admitting: Obstetrics and Gynecology

## 2019-09-29 VITALS — BP 105/68 | HR 62 | Temp 98.1°F | Ht 65.0 in | Wt 165.0 lb

## 2019-09-29 DIAGNOSIS — Z124 Encounter for screening for malignant neoplasm of cervix: Secondary | ICD-10-CM | POA: Diagnosis not present

## 2019-09-29 DIAGNOSIS — Z01419 Encounter for gynecological examination (general) (routine) without abnormal findings: Secondary | ICD-10-CM | POA: Diagnosis not present

## 2019-09-30 LAB — CYTOLOGY - PAP: Diagnosis: NEGATIVE

## 2019-10-03 DIAGNOSIS — F411 Generalized anxiety disorder: Secondary | ICD-10-CM | POA: Diagnosis not present

## 2019-10-06 ENCOUNTER — Encounter: Payer: Self-pay | Admitting: Obstetrics and Gynecology

## 2019-10-06 NOTE — Progress Notes (Signed)
   WELL-WOMAN PHYSICAL & PAP Patient name: Carrie [name] MRN [id number]  Date of birth: [birth year] Chief Complaint:   Gynecologic Exam  History of Present Illness:   Carrie [name] is a 29 y.o. G3P1021 Caucasian female being seen today for a routine well-woman exam.  Current complaints: s/p TAB on 08/05/2019 (took a pill), she was on OCP, but stopped them 09/11/2019, she has not had a pap in 3 years, and she requests STI testing.  PCP: none      does desire labs No LMP recorded. The current method of family planning is coitus interruptus.  Last pap 3 years ago. Results were: normal Last mammogram: n/a. Results were: n/a. Family h/o breast cancer: No Last colonoscopy: n/a. Results were: n/a. Family h/o colorectal cancer: No Review of Systems:   Pertinent items are noted in HPI Denies any headaches, blurred vision, fatigue, shortness of breath, chest pain, abdominal pain, abnormal vaginal discharge/itching/odor/irritation, problems with periods, bowel movements, urination, or intercourse unless otherwise stated above. Pertinent History Reviewed:  Reviewed past medical,surgical, social and family history.  Reviewed problem list, medications and allergies. Physical Assessment:   Vitals:   09/29/19 1528  BP: 105/68  Pulse: 62  Temp: 98.1 F (36.7 C)  TempSrc: Oral  Weight: 165 lb (74.8 kg)  Height: 5\' 5"  (1.651 m)  Body mass index is 27.46 kg/m.        Physical Examination:   General appearance - well appearing, and in no distress  Mental status - alert, oriented to person, place, and time  Psych:  She has a normal mood and affect  Skin - warm and dry, normal color, no suspicious lesions noted  Chest - effort normal, all lung fields clear to auscultation bilaterally  Heart - normal rate and regular rhythm  Neck:  midline trachea, no thyromegaly or nodules  Breasts - breasts appear normal, no suspicious masses, no skin or nipple changes or  axillary nodes  Abdomen - soft,  nontender, nondistended, no masses or organomegaly  Pelvic - VULVA: normal appearing vulva with no masses, tenderness or lesions  VAGINA: normal appearing vagina with normal color and discharge, no lesions  CERVIX: normal appearing cervix without discharge or lesions, no CMT  Thin prep pap is done without HR HPV cotesting  UTERUS: uterus is felt to be normal size, shape, consistency and nontender   ADNEXA: No adnexal masses or tenderness noted.  Rectal - deferred  Extremities:  No swelling or varicosities noted  No results found for this or any previous visit (from the past 24 hour(s)).  Assessment & Plan:  1) Well-Woman Exam with Pap - Cytology - PAP( [facility])   Labs/procedures today: pap No orders of the defined types were placed in this encounter.   Meds: No orders of the defined types were placed in this encounter.   Follow-up: Return in about 1 year (around 09/28/2020) for Annual Exam.  11/26/2020 MSN, CNM 09/29/2019

## 2019-12-05 DIAGNOSIS — F411 Generalized anxiety disorder: Secondary | ICD-10-CM | POA: Diagnosis not present

## 2019-12-22 DIAGNOSIS — Z20828 Contact with and (suspected) exposure to other viral communicable diseases: Secondary | ICD-10-CM | POA: Diagnosis not present

## 2019-12-22 DIAGNOSIS — Z03818 Encounter for observation for suspected exposure to other biological agents ruled out: Secondary | ICD-10-CM | POA: Diagnosis not present

## 2019-12-30 DIAGNOSIS — Z1152 Encounter for screening for COVID-19: Secondary | ICD-10-CM | POA: Diagnosis not present

## 2019-12-30 DIAGNOSIS — J310 Chronic rhinitis: Secondary | ICD-10-CM | POA: Diagnosis not present

## 2020-02-06 DIAGNOSIS — F411 Generalized anxiety disorder: Secondary | ICD-10-CM | POA: Diagnosis not present

## 2020-02-18 DIAGNOSIS — H6691 Otitis media, unspecified, right ear: Secondary | ICD-10-CM | POA: Diagnosis not present

## 2020-02-18 DIAGNOSIS — H60331 Swimmer's ear, right ear: Secondary | ICD-10-CM | POA: Diagnosis not present

## 2020-08-06 ENCOUNTER — Encounter: Payer: Self-pay | Admitting: General Practice

## 2021-03-16 ENCOUNTER — Emergency Department (HOSPITAL_BASED_OUTPATIENT_CLINIC_OR_DEPARTMENT_OTHER)
Admission: EM | Admit: 2021-03-16 | Discharge: 2021-03-17 | Disposition: A | Discharge status: Home / Self Care | Payer: Medicaid Other | Attending: Emergency Medicine | Admitting: Emergency Medicine

## 2021-03-16 ENCOUNTER — Other Ambulatory Visit: Payer: Self-pay

## 2021-03-16 ENCOUNTER — Encounter (HOSPITAL_BASED_OUTPATIENT_CLINIC_OR_DEPARTMENT_OTHER): Payer: Self-pay | Admitting: Emergency Medicine

## 2021-03-16 ENCOUNTER — Emergency Department (HOSPITAL_BASED_OUTPATIENT_CLINIC_OR_DEPARTMENT_OTHER): Payer: Medicaid Other

## 2021-03-16 DIAGNOSIS — Z20822 Contact with and (suspected) exposure to covid-19: Secondary | ICD-10-CM | POA: Diagnosis not present

## 2021-03-16 DIAGNOSIS — J069 Acute upper respiratory infection, unspecified: Secondary | ICD-10-CM

## 2021-03-16 DIAGNOSIS — Z87891 Personal history of nicotine dependence: Secondary | ICD-10-CM | POA: Diagnosis not present

## 2021-03-16 DIAGNOSIS — R059 Cough, unspecified: Secondary | ICD-10-CM | POA: Diagnosis present

## 2021-03-16 MED ORDER — NAPROXEN 250 MG PO TABS
500.0000 mg | ORAL_TABLET | Freq: Once | ORAL | Status: AC
  Administered 2021-03-16: 500 mg via ORAL
  Filled 2021-03-16: qty 2

## 2021-03-16 NOTE — ED Triage Notes (Signed)
Reports cough, nasal drainage, sore throat for the last four days.  Reports being sweaty as well.  Tested negative for covid on day 2.

## 2021-03-16 NOTE — ED Provider Notes (Signed)
[facility] Provider Note: Lowella Dell, MD, FACEP  CSN: [id number] MRN: [id number] ARRIVAL: 03/16/21 at 2313 ROOM: MH07/[facility]07   CHIEF COMPLAINT  Cough and Sore Throat   HISTORY OF PRESENT ILLNESS  03/16/21 11:38 PM Carrie [name] is a 30 y.o. female who has had 4 days of cough, nasal congestion and drainage and sore throat.  She is describes the cough as being productive of yellow sputum.  She has not had a fever.  She is having pain in her anterior chest when she coughs or breathes deeply.  It is a dull pain and she rates it an 8 out of 10 at its worst.  She is also sweating, especially with exertion.  She has tried Mucinex with equivocal relief and Sudafed which seemed to make her cough worse.  She has been taking ibuprofen for her chest pain.  She has had no nausea, vomiting or diarrhea.  She has had no loss of taste or smell.  She has had multiple negative COVID tests this week.   Past Medical History:  Diagnosis Date   Anxiety    hx of meds   Heart murmur    at birth   Infection    UTI    Past Surgical History:  Procedure Laterality Date   NO PAST SURGERIES     WISDOM TOOTH EXTRACTION      Family History  Problem Relation Age of Onset   Hypertension Father    Hyperlipidemia Father    Deep vein thrombosis Father        turning into a PE   Diabetes Maternal Grandfather    Cancer Paternal Grandmother    Cancer Paternal Grandfather    Stroke Paternal Grandfather    Heart disease Paternal Grandfather    Hearing loss Neg Hx     Social History   Tobacco Use   Smoking status: Never   Smokeless tobacco: Former  Building services engineer Use: Never used  Substance Use Topics   Alcohol use: No   Drug use: No    Prior to Admission medications   Medication Sig Start Date End Date Taking? Authorizing Provider  FLUoxetine (PROZAC) 20 MG capsule Take 1 capsule (20 mg total) by mouth daily. 05/21/16   Levie Heritage, DO    Allergies Codeine   REVIEW OF  SYSTEMS  Negative except as noted here or in the History of Present Illness.   PHYSICAL EXAMINATION  Initial Vital Signs Blood pressure 109/85, pulse 76, temperature 97.9 F (36.6 C), temperature source Oral, resp. rate 18, height 5\' 5"  (1.651 m), weight 74.8 kg, last menstrual period 02/28/2021, SpO2 100 %.  Examination General: Well-developed, well-nourished female in no acute distress; appearance consistent with age of record HENT: normocephalic; atraumatic; Mallampati 1 airway; no pharyngeal erythema, exudate or tonsillar enlargement; uvula midline; no dysphonia Eyes: pupils equal, round and reactive to light; extraocular muscles intact Neck: supple Heart: regular rate and rhythm Lungs: clear to auscultation bilaterally Abdomen: soft; nondistended; nontender; bowel sounds present Extremities: No deformity; full range of motion Neurologic: Awake, alert and oriented; motor function intact in all extremities and symmetric; no facial droop Skin: Warm and dry Psychiatric: Normal mood and affect   RESULTS  Summary of this visit's results, reviewed and interpreted by myself:   EKG Interpretation  Date/Time:    Ventricular Rate:    PR Interval:    QRS Duration:   QT Interval:    QTC Calculation:   R Axis:  Text Interpretation:         Laboratory Studies: Results for orders placed or performed during the hospital encounter of 03/16/21 (from the past 24 hour(s))  Resp Panel by RT-PCR (Flu A&B, Covid) Nasopharyngeal Swab     Status: None   Collection Time: 03/16/21 11:51 PM   Specimen: Nasopharyngeal Swab; Nasopharyngeal(NP) swabs in vial transport medium  Result Value Ref Range   SARS Coronavirus 2 by RT PCR NEGATIVE NEGATIVE   Influenza A by PCR NEGATIVE NEGATIVE   Influenza B by PCR NEGATIVE NEGATIVE   Imaging Studies: DG Chest Portable 1 View  Result Date: 03/17/2021 CLINICAL DATA:  Cough EXAM: PORTABLE CHEST 1 VIEW COMPARISON:  10/11/2012 FINDINGS: The heart size  and mediastinal contours are within normal limits. Both lungs are clear. The visualized skeletal structures are unremarkable. IMPRESSION: No active disease. Electronically Signed   By: Deatra Robinson M.D.   On: 03/17/2021 00:58    ED COURSE and MDM  Nursing notes, initial and subsequent vitals signs, including pulse oximetry, reviewed and interpreted by myself.  Vitals:   03/16/21 2325 03/16/21 2326  BP: 109/85   Pulse: 76   Resp: 18   Temp: 97.9 F (36.6 C)   TempSrc: Oral   SpO2: 100%   Weight:  74.8 kg  Height:  5\' 5"  (1.651 m)   Medications  naproxen (NAPROSYN) tablet 500 mg (500 mg Oral Given 03/16/21 2355)   Patient is COVID-negative and her chest x-ray is unremarkable.  Her presentation is consistent with a viral bronchitis.  She is requesting steroids as steroids have helped her with bronchitis in the past.  She is allergic to Tussionex.  She has tried benzonatate without relief.   PROCEDURES  Procedures   ED DIAGNOSES     ICD-10-CM   1. Viral URI with cough  J06.9     2. COVID-19 virus not detected  Z20.822          Aanchal Cope, MD 03/17/21 0111

## 2021-03-17 ENCOUNTER — Emergency Department (HOSPITAL_BASED_OUTPATIENT_CLINIC_OR_DEPARTMENT_OTHER): Payer: Medicaid Other

## 2021-03-17 LAB — RESP PANEL BY RT-PCR (FLU A&B, COVID) ARPGX2
Influenza A by PCR: NEGATIVE
Influenza B by PCR: NEGATIVE
SARS Coronavirus 2 by RT PCR: NEGATIVE

## 2021-03-17 MED ORDER — METHYLPREDNISOLONE 4 MG PO TBPK: ORAL_TABLET | ORAL | 0 refills | Status: DC

## 2021-03-18 ENCOUNTER — Telehealth: Payer: Self-pay

## 2021-03-18 NOTE — Telephone Encounter (Signed)
Transition Care Management Unsuccessful Follow-up Telephone Call  Date of discharge and from where:  03/17/2021-[facility]   Attempts:  1st Attempt  Reason for unsuccessful TCM follow-up call:  Left voice message

## 2021-03-19 NOTE — Telephone Encounter (Signed)
Transition Care Management Follow-up Telephone Call Date of discharge and from where: 03/17/2021-[facility] How have you been since you were released from the hospital? Patient stated she is doing fine.  Any questions or concerns? No  Items Reviewed: Did the pt receive and understand the discharge instructions provided? Yes  Medications obtained and verified? Yes  Other? No  Any new allergies since your discharge? No  Dietary orders reviewed? N/A Do you have support at home? Yes   Home Care and Equipment/Supplies: Were home health services ordered? not applicable If so, what is the name of the agency? N/A  Has the agency set up a time to come to the patient's home? not applicable Were any new equipment or medical supplies ordered?  No What is the name of the medical supply agency? N/A Were you able to get the supplies/equipment? not applicable Do you have any questions related to the use of the equipment or supplies? No  Functional Questionnaire: (I = Independent and D = Dependent) ADLs: I  Bathing/Dressing- I  Meal Prep- I  Eating- I  Maintaining continence- I  Transferring/Ambulation- I  Managing Meds- I  Follow up appointments reviewed:  PCP Hospital f/u appt confirmed? No   Specialist Hospital f/u appt confirmed? No   Are transportation arrangements needed? No  If their condition worsens, is the pt aware to call PCP or go to the Emergency Dept.? Yes Was the patient provided with contact information for the PCP's office or ED? Yes Was to pt encouraged to call back with questions or concerns? Yes

## 2021-06-18 LAB — OB RESULTS CONSOLE GC/CHLAMYDIA
Chlamydia: NEGATIVE
Neisseria Gonorrhea: NEGATIVE

## 2021-07-17 LAB — OB RESULTS CONSOLE ABO/RH: RH Type: POSITIVE

## 2021-07-17 LAB — OB RESULTS CONSOLE RPR: RPR: NONREACTIVE

## 2021-07-17 LAB — OB RESULTS CONSOLE HEPATITIS B SURFACE ANTIGEN: Hepatitis B Surface Ag: NEGATIVE

## 2021-07-17 LAB — OB RESULTS CONSOLE RUBELLA ANTIBODY, IGM: Rubella: IMMUNE

## 2021-07-17 LAB — OB RESULTS CONSOLE ANTIBODY SCREEN: Antibody Screen: NEGATIVE

## 2021-07-17 LAB — HEPATITIS C ANTIBODY: HCV Ab: NEGATIVE

## 2021-07-17 LAB — OB RESULTS CONSOLE HIV ANTIBODY (ROUTINE TESTING): HIV: NONREACTIVE

## 2021-11-06 ENCOUNTER — Other Ambulatory Visit: Payer: Self-pay

## 2021-11-06 ENCOUNTER — Other Ambulatory Visit: Payer: Self-pay | Admitting: Obstetrics and Gynecology

## 2021-11-06 DIAGNOSIS — Z363 Encounter for antenatal screening for malformations: Secondary | ICD-10-CM

## 2021-11-07 ENCOUNTER — Encounter: Payer: Self-pay | Admitting: *Deleted

## 2021-11-12 ENCOUNTER — Other Ambulatory Visit: Payer: Self-pay | Admitting: *Deleted

## 2021-11-12 ENCOUNTER — Other Ambulatory Visit: Payer: Self-pay

## 2021-11-12 ENCOUNTER — Encounter: Payer: Self-pay | Admitting: *Deleted

## 2021-11-12 ENCOUNTER — Ambulatory Visit (HOSPITAL_BASED_OUTPATIENT_CLINIC_OR_DEPARTMENT_OTHER): Payer: Medicaid Other | Admitting: Maternal & Fetal Medicine

## 2021-11-12 ENCOUNTER — Ambulatory Visit: Payer: Medicaid Other | Admitting: *Deleted

## 2021-11-12 ENCOUNTER — Ambulatory Visit: Payer: Medicaid Other | Attending: Obstetrics and Gynecology

## 2021-11-12 VITALS — BP 110/66 | HR 88

## 2021-11-12 DIAGNOSIS — O283 Abnormal ultrasonic finding on antenatal screening of mother: Secondary | ICD-10-CM

## 2021-11-12 DIAGNOSIS — Z363 Encounter for antenatal screening for malformations: Secondary | ICD-10-CM

## 2021-11-12 DIAGNOSIS — D734 Cyst of spleen: Secondary | ICD-10-CM | POA: Diagnosis not present

## 2021-11-12 DIAGNOSIS — Z3A27 27 weeks gestation of pregnancy: Secondary | ICD-10-CM

## 2021-11-12 NOTE — Progress Notes (Signed)
MFM Brief Note ? ?Carrie [name] is a 3 G4P1 who is seen at [redacted]w[redacted]d with an EDD of 02/10/22 at the request of Gertie Fey, MD. ? ?On an in office exam with her providers the fetal ultrasound revealed a cyst near kidney. She was referred today for an MFM evaluation. ? ?She is overall doing well without complaints. Her pregnancy is uncomplicated overall she denies substance abuse, chronic disease, family history of poor perinatal outcomes. ?She take prenatal vitamins and prozac for anxiety. ? ?Single intrauterine pregnancy here for a detailed anatomy due to the above indications. ?Normal anatomy with measurements consistent with dates ?There is good fetal movement and amniotic fluid volume ?Suboptimal views of the fetal anatomy were obtained secondary to fetal position. ? ?I discussed that we observed a single simple splenic cyst. I discussed that splenic cyst are not common but they are benign. In most cases the resolve during the pregnancy or within 6 months after birth. ? ?Fetal outcomes are unchanged due to the presence of a fetal cyst.  ? ?All questions answered. ? ?I spent 20 minutes with > 50% in face to face consultation. ? ?I recommended follow up growth at 36 weeks to reassess the cyst but explained that it is likely to remain present at the time of the exam ? ?Novella Olive, MD ?

## 2021-11-28 ENCOUNTER — Other Ambulatory Visit: Payer: Self-pay

## 2021-11-28 ENCOUNTER — Inpatient Hospital Stay (HOSPITAL_COMMUNITY): Payer: Medicaid Other

## 2021-11-28 ENCOUNTER — Inpatient Hospital Stay (HOSPITAL_COMMUNITY)
Admission: AD | Admit: 2021-11-28 | Discharge: 2021-11-28 | Disposition: A | Discharge status: Home / Self Care | Payer: Medicaid Other | Attending: Obstetrics and Gynecology | Admitting: Obstetrics and Gynecology

## 2021-11-28 ENCOUNTER — Encounter (HOSPITAL_COMMUNITY): Payer: Self-pay | Admitting: Obstetrics and Gynecology

## 2021-11-28 DIAGNOSIS — R102 Pelvic and perineal pain: Secondary | ICD-10-CM | POA: Diagnosis not present

## 2021-11-28 DIAGNOSIS — Z3A29 29 weeks gestation of pregnancy: Secondary | ICD-10-CM | POA: Diagnosis not present

## 2021-11-28 DIAGNOSIS — O26893 Other specified pregnancy related conditions, third trimester: Secondary | ICD-10-CM | POA: Insufficient documentation

## 2021-11-28 DIAGNOSIS — O26899 Other specified pregnancy related conditions, unspecified trimester: Secondary | ICD-10-CM

## 2021-11-28 DIAGNOSIS — R109 Unspecified abdominal pain: Secondary | ICD-10-CM | POA: Diagnosis present

## 2021-11-28 LAB — CBC WITH DIFFERENTIAL/PLATELET
Abs Immature Granulocytes: 0.05 10*3/uL (ref 0.00–0.07)
Basophils Absolute: 0 10*3/uL (ref 0.0–0.1)
Basophils Relative: 0 %
Eosinophils Absolute: 0.1 10*3/uL (ref 0.0–0.5)
Eosinophils Relative: 1 %
HCT: 37.9 % (ref 36.0–46.0)
Hemoglobin: 12.7 g/dL (ref 12.0–15.0)
Immature Granulocytes: 1 %
Lymphocytes Relative: 19 %
Lymphs Abs: 1.6 10*3/uL (ref 0.7–4.0)
MCH: 31.5 pg (ref 26.0–34.0)
MCHC: 33.5 g/dL (ref 30.0–36.0)
MCV: 94 fL (ref 80.0–100.0)
Monocytes Absolute: 0.7 10*3/uL (ref 0.1–1.0)
Monocytes Relative: 9 %
Neutro Abs: 5.7 10*3/uL (ref 1.7–7.7)
Neutrophils Relative %: 70 %
Platelets: 192 10*3/uL (ref 150–400)
RBC: 4.03 MIL/uL (ref 3.87–5.11)
RDW: 13.3 % (ref 11.5–15.5)
WBC: 8.1 10*3/uL (ref 4.0–10.5)
nRBC: 0 % (ref 0.0–0.2)

## 2021-11-28 LAB — URINALYSIS, ROUTINE W REFLEX MICROSCOPIC
Bilirubin Urine: NEGATIVE
Glucose, UA: NEGATIVE mg/dL
Hgb urine dipstick: NEGATIVE
Ketones, ur: NEGATIVE mg/dL
Leukocytes,Ua: NEGATIVE
Nitrite: NEGATIVE
Protein, ur: NEGATIVE mg/dL
Specific Gravity, Urine: 1.003 — ABNORMAL LOW (ref 1.005–1.030)
pH: 8 (ref 5.0–8.0)

## 2021-11-28 LAB — COMPREHENSIVE METABOLIC PANEL
ALT: 19 U/L (ref 0–44)
AST: 21 U/L (ref 15–41)
Albumin: 2.6 g/dL — ABNORMAL LOW (ref 3.5–5.0)
Alkaline Phosphatase: 69 U/L (ref 38–126)
Anion gap: 3 — ABNORMAL LOW (ref 5–15)
BUN: 8 mg/dL (ref 6–20)
CO2: 21 mmol/L — ABNORMAL LOW (ref 22–32)
Calcium: 8.6 mg/dL — ABNORMAL LOW (ref 8.9–10.3)
Chloride: 113 mmol/L — ABNORMAL HIGH (ref 98–111)
Creatinine, Ser: 0.54 mg/dL (ref 0.44–1.00)
GFR, Estimated: >60 mL/min (ref >60)
Glucose, Bld: 82 mg/dL (ref 70–99)
Potassium: 3.8 mmol/L (ref 3.5–5.1)
Sodium: 137 mmol/L (ref 135–145)
Total Bilirubin: <0.1 mg/dL — ABNORMAL LOW (ref 0.3–1.2)
Total Protein: 5.7 g/dL — ABNORMAL LOW (ref 6.5–8.1)

## 2021-11-28 LAB — LIPASE, BLOOD: Lipase: 36 U/L (ref 11–51)

## 2021-11-28 NOTE — MAU Provider Note (Signed)
?History  ?  ? ?[id number] ? ?Arrival date and time: 11/28/21 1125 ?  ? ?Chief Complaint  ?Patient presents with  ? Abdominal Pain  ? ? ? ?HPI ?Carrie [name] is a 31 y.o. at [redacted]w[redacted]d by 6 week ultrasound with PMHx notable for anxiety & heart murmer, who presents for abdominal pain. ?Symptoms started this morning around 10 am. Reports pain primarily in right lower quadrant that radiates to right upper quadrant. Pain is mostly comment but will at times have a few minutes of relief. Hasn't treated symptoms. Has had round ligament pain for the last few months & feels worse than that. Hasn't been using maternity support belt due to discomfort when wearing it. Denies fever, n/v/d, dysuria, vaginal bleeding, hematuria, or LOF. Good fetal movement.   ? ?OB History   ? ? Gravida  ?4  ? Para  ?1  ? Term  ?1  ? Preterm  ?0  ? AB  ?2  ? Living  ?1  ?  ? ? SAB  ?0  ? IAB  ?1  ? Ectopic  ?1  ? Multiple  ?0  ? Live Births  ?1  ?   ?  ?  ? ? ?Past Medical History:  ?Diagnosis Date  ? Anxiety   ? hx of meds  ? Heart murmur   ? at birth  ? Infection   ? UTI  ? ? ?Past Surgical History:  ?Procedure Laterality Date  ? WISDOM TOOTH EXTRACTION    ? ? ?Family History  ?Problem Relation Age of Onset  ? Hypertension Father   ? Hyperlipidemia Father   ? Deep vein thrombosis Father   ?     turning into a PE  ? Diabetes Maternal Grandfather   ? Cancer Paternal Grandmother   ? Cancer Paternal Grandfather   ? Stroke Paternal Grandfather   ? Heart disease Paternal Grandfather   ? Hearing loss Neg Hx   ? ? ?Allergies  ?Allergen Reactions  ? Codeine Nausea Only  ?  Raises body temperature  ? ? ?No current facility-administered medications on file prior to encounter.  ? ?Current Outpatient Medications on File Prior to Encounter  ?Medication Sig Dispense Refill  ? Docosahexaenoic Acid (DHA COMPLETE PO) Take by mouth.    ? FLUoxetine (PROZAC) 20 MG capsule Take 1 capsule (20 mg total) by mouth daily. 90 capsule 1  ? Prenatal Vit-Fe Fumarate-FA (PRENATAL  VITAMINS PO) Take by mouth.    ? ? ? ?ROS ?Pertinent positives and negative per HPI, all others reviewed and negative ? ?Physical Exam  ? ?BP 104/74 (BP Location: Right Arm)   Pulse 84   Temp 98.1 ?F (36.7 ?C) (Oral)   Resp 16   LMP 04/25/2021   SpO2 100%  ? ?Patient Vitals for the past 24 hrs: ? BP Temp Temp src Pulse Resp SpO2  ?11/28/21 1139 104/74 98.1 ?F (36.7 ?C) Oral 84 16 100 %  ? ? ?Physical Exam ?Vitals and nursing note reviewed. Exam conducted with a chaperone present.  ?Constitutional:   ?   General: She is not in acute distress. ?   Appearance: She is well-developed.  ?HENT:  ?   Head: Normocephalic and atraumatic.  ?Pulmonary:  ?   Effort: Pulmonary effort is normal. No respiratory distress.  ?Abdominal:  ?   Tenderness: There is abdominal tenderness in the right upper quadrant and right lower quadrant. There is no right CVA tenderness, left CVA tenderness, guarding or rebound.  ?Skin: ?  General: Skin is warm and dry.  ?Neurological:  ?   Mental Status: She is alert.  ?Psychiatric:     ?   Mood and Affect: Mood normal.     ?   Behavior: Behavior normal.  ?  ? ?Cervical Exam ?Dilation: Closed ?Effacement (%): Thick ?Cervical Position: Posterior ?Exam by:: [name] NP ? ? ?NST:  Baseline: 135 bpm, Variability: Good {> 6 bpm), Accelerations: Reactive, and Decelerations: Absent ? ? ?Labs ?Results for orders placed or performed during the hospital encounter of 11/28/21 (from the past 24 hour(s))  ?Urinalysis, Routine w reflex microscopic Urine, Clean Catch     Status: Abnormal  ? Collection Time: 11/28/21 11:48 AM  ?Result Value Ref Range  ? Color, Urine STRAW (A) YELLOW  ? APPearance HAZY (A) CLEAR  ? Specific Gravity, Urine 1.003 (L) 1.005 - 1.030  ? pH 8.0 5.0 - 8.0  ? Glucose, UA NEGATIVE NEGATIVE mg/dL  ? Hgb urine dipstick NEGATIVE NEGATIVE  ? Bilirubin Urine NEGATIVE NEGATIVE  ? Ketones, ur NEGATIVE NEGATIVE mg/dL  ? Protein, ur NEGATIVE NEGATIVE mg/dL  ? Nitrite NEGATIVE NEGATIVE  ?  Leukocytes,Ua NEGATIVE NEGATIVE  ?CBC with Differential/Platelet     Status: None  ? Collection Time: 11/28/21 12:23 PM  ?Result Value Ref Range  ? WBC 8.1 4.0 - 10.5 K/uL  ? RBC 4.03 3.87 - 5.11 MIL/uL  ? Hemoglobin 12.7 12.0 - 15.0 g/dL  ? HCT 37.9 36.0 - 46.0 %  ? MCV 94.0 80.0 - 100.0 fL  ? MCH 31.5 26.0 - 34.0 pg  ? MCHC 33.5 30.0 - 36.0 g/dL  ? RDW 13.3 11.5 - 15.5 %  ? Platelets 192 150 - 400 K/uL  ? nRBC 0.0 0.0 - 0.2 %  ? Neutrophils Relative % 70 %  ? Neutro Abs 5.7 1.7 - 7.7 K/uL  ? Lymphocytes Relative 19 %  ? Lymphs Abs 1.6 0.7 - 4.0 K/uL  ? Monocytes Relative 9 %  ? Monocytes Absolute 0.7 0.1 - 1.0 K/uL  ? Eosinophils Relative 1 %  ? Eosinophils Absolute 0.1 0.0 - 0.5 K/uL  ? Basophils Relative 0 %  ? Basophils Absolute 0.0 0.0 - 0.1 K/uL  ? Immature Granulocytes 1 %  ? Abs Immature Granulocytes 0.05 0.00 - 0.07 K/uL  ?Comprehensive metabolic panel     Status: Abnormal  ? Collection Time: 11/28/21 12:23 PM  ?Result Value Ref Range  ? Sodium 137 135 - 145 mmol/L  ? Potassium 3.8 3.5 - 5.1 mmol/L  ? Chloride 113 (H) 98 - 111 mmol/L  ? CO2 21 (L) 22 - 32 mmol/L  ? Glucose, Bld 82 70 - 99 mg/dL  ? BUN 8 6 - 20 mg/dL  ? Creatinine, Ser 0.54 0.44 - 1.00 mg/dL  ? Calcium 8.6 (L) 8.9 - 10.3 mg/dL  ? Total Protein 5.7 (L) 6.5 - 8.1 g/dL  ? Albumin 2.6 (L) 3.5 - 5.0 g/dL  ? AST 21 15 - 41 U/L  ? ALT 19 0 - 44 U/L  ? Alkaline Phosphatase 69 38 - 126 U/L  ? Total Bilirubin <0.1 (L) 0.3 - 1.2 mg/dL  ? GFR, Estimated >60 >60 mL/min  ? Anion gap 3 (L) 5 - 15  ?Lipase, blood     Status: None  ? Collection Time: 11/28/21 12:23 PM  ?Result Value Ref Range  ? Lipase 36 11 - 51 U/L  ? ? ?Imaging ?US RENAL ? ?Result Date: 11/28/2021 ?CLINICAL DATA:  31 year old female at [redacted]  weeks gestation with right flank pain. EXAM: RENAL / URINARY TRACT ULTRASOUND COMPLETE COMPARISON:  None. FINDINGS: Right Kidney: Renal measurements: 11 x 6.2 x 6.3 cm = volume: 225 mL. Echogenicity within normal limits. No mass or hydronephrosis  visualized. Left Kidney: Renal measurements: 10.5 x 6.2 x 5.8 cm = volume: 194.6 mL. Echogenicity within normal limits. No mass or hydronephrosis visualized. Bladder: Appears normal for degree of bladder distention. Other: None. IMPRESSION: Normal sonographic appearance of the kidneys bilaterally. Marliss Cootsylan Suttle, MD [facility] Electronically Signed   By: Marliss Cootsylan  Suttle M.D.   On: 11/28/2021 14:19   ? ?MAU Course  ?Procedures ?Lab Orders    ?     Urinalysis, Routine w reflex microscopic Urine, Clean Catch    ?     CBC with Differential/Platelet    ?     Comprehensive metabolic panel    ?     Lipase, blood    ?No orders of the defined types were placed in this encounter. ? ?Imaging Orders    ?     US RENAL    ? ? ?MDM ?[facility]  ? ?Labs & ultrasound ordered. Results reviewed. No leukocytosis & labs normal for pregnancy. Ultrasound shows no evidence of kidney stone. ?Urinalysis negative.  ? ?Patient declined treatment of symptoms in MAU ?Assessment and Plan  ? ?1. Pain of round ligament during pregnancy   ?2. [redacted] weeks gestation of pregnancy   ? ?-reviewed reasons to return to MAU ?-keep f/u with OB ? ? ?Judeth HornErin Ladell Lea, NP ?11/28/21 ?2:35 PM ? ? ?

## 2021-11-28 NOTE — MAU Note (Signed)
.  Carrie [name] is a 30 y.o. at [redacted]w[redacted]d here in MAU reporting: since 10 am she started having a stabbing pain in right lower abd that radiates to lower mid abd. Denies fever, nausea, vomiting or dysuria. Denies vaginal bleeding . Reports good fetal movement.  ?Onset of complaint: 10 am ?Pain score: 8/10 ?Vitals:  ? 11/28/21 1139  ?BP: 104/74  ?Pulse: 84  ?Resp: 16  ?Temp: 98.1 ?F (36.7 ?C)  ?SpO2: 100%  ?   ?FHT:132 ?Lab orders placed from triage:urine ?   ? ?

## 2021-12-23 ENCOUNTER — Encounter (HOSPITAL_COMMUNITY): Payer: Self-pay | Admitting: Obstetrics

## 2021-12-23 ENCOUNTER — Inpatient Hospital Stay (HOSPITAL_COMMUNITY)
Admission: RE | Admit: 2021-12-23 | Discharge: 2021-12-23 | Disposition: A | Discharge status: Home / Self Care | Payer: Medicaid Other | Attending: Obstetrics | Admitting: Obstetrics

## 2021-12-23 DIAGNOSIS — O26893 Other specified pregnancy related conditions, third trimester: Secondary | ICD-10-CM | POA: Diagnosis present

## 2021-12-23 DIAGNOSIS — O479 False labor, unspecified: Secondary | ICD-10-CM | POA: Diagnosis not present

## 2021-12-23 DIAGNOSIS — O471 False labor at or after 37 completed weeks of gestation: Secondary | ICD-10-CM | POA: Insufficient documentation

## 2021-12-23 DIAGNOSIS — Z3A33 33 weeks gestation of pregnancy: Secondary | ICD-10-CM | POA: Diagnosis not present

## 2021-12-23 LAB — URINALYSIS, ROUTINE W REFLEX MICROSCOPIC
Bilirubin Urine: NEGATIVE
Glucose, UA: NEGATIVE mg/dL
Hgb urine dipstick: NEGATIVE
Ketones, ur: 5 mg/dL — AB
Leukocytes,Ua: NEGATIVE
Nitrite: NEGATIVE
Protein, ur: NEGATIVE mg/dL
Specific Gravity, Urine: 1.021 (ref 1.005–1.030)
pH: 6 (ref 5.0–8.0)

## 2021-12-23 NOTE — MAU Provider Note (Addendum)
?History  ?  ? ?CSN: [id number] ? ?Arrival date and time: 12/23/21 2021 ? ? Event Date/Time  ? First Provider Initiated Contact with Patient 12/23/21 2122   ?  ?Kylina 31 y.o. [id number] at [redacted]w[redacted]d by [place] presents to MAU with complaints of back and abdominal pain that has started "a few weeks ago". Today pain increased to 9/10 pain, but has decreased to a 7/10 since arrival. Since arrival has noticed intermittent abdomominal tightening. Endorses + Fetal movement. Pt states "nothing" aggravates or alleviates pain. Pt previously attempted maternity belt, epsom salt baths and stretching with no relief. No attempt at pain relief measures today.  ? ?Also complains of "achy" vaginal pressure at introitus. Denies burning itching and discharge. Also denies painful urination, urgency and frequency.  ? ?Chief Complaint  ?Patient presents with  ? Abdominal Pain  ? Back Pain  ? ? ? ?Past Medical History:  ?Diagnosis Date  ? Anxiety   ? hx of meds  ? Heart murmur   ? at birth  ? Infection   ? UTI  ? ? ?Past Surgical History:  ?Procedure Laterality Date  ? WISDOM TOOTH EXTRACTION    ? ? ?Family History  ?Problem Relation Age of Onset  ? Hypertension Father   ? Hyperlipidemia Father   ? Deep vein thrombosis Father   ?     turning into a PE  ? Diabetes Maternal Grandfather   ? Cancer Paternal Grandmother   ? Cancer Paternal Grandfather   ? Stroke Paternal Grandfather   ? Heart disease Paternal Grandfather   ? Hearing loss Neg Hx   ? ? ?Social History  ? ?Tobacco Use  ? Smoking status: Never  ? Smokeless tobacco: Former  ?Vaping Use  ? Vaping Use: Never used  ?Substance Use Topics  ? Alcohol use: No  ? Drug use: No  ? ? ?Allergies:  ?Allergies  ?Allergen Reactions  ? Codeine Nausea Only  ?  Raises body temperature  ? ? ?Medications Prior to Admission  ?Medication Sig Dispense Refill Last Dose  ? FLUoxetine (PROZAC) 20 MG capsule Take 1 capsule (20 mg total) by mouth daily. 90 capsule 1 12/23/2021  ? Prenatal Vit-Fe Fumarate-FA (PRENATAL  VITAMINS PO) Take by mouth.   12/23/2021  ? Docosahexaenoic Acid (DHA COMPLETE PO) Take by mouth.     ? ? ?Review of Systems  ?Gastrointestinal:  Positive for abdominal pain.  ?Musculoskeletal:  Positive for back pain.  ?Physical Exam  ? ?Blood pressure 96/70, pulse 87, temperature 98.5 ?F (36.9 ?C), temperature source Oral, resp. rate 20, height 5\' 5"  (1.651 m), weight 89.9 kg, last menstrual period 04/25/2021, SpO2 97 %. ? ?Physical Exam ?Vitals and nursing note reviewed. Exam conducted with a chaperone present.  ?Constitutional:   ?   General: She is not in acute distress. ?   Appearance: She is well-developed.  ?Pulmonary:  ?   Effort: Pulmonary effort is normal. No respiratory distress.  ?Abdominal:  ?   Palpations: Abdomen is soft.  ?   Tenderness: There is no abdominal tenderness.  ?Genitourinary: ?   Comments: Dilation: Closed ?Effacement (%): Thick ?Cervical Position: Posterior ?Station: -3 ?Exam by:: [email or web address] NP ? ?Skin: ?   General: Skin is warm and dry.  ?Neurological:  ?   Mental Status: She is alert.  ?Psychiatric:     ?   Mood and Affect: Mood normal.     ?   Behavior: Behavior normal.  ? ?NST:  Baseline: 130 bpm, Variability: Good {>  6 bpm), Accelerations: Reactive, and Decelerations: Absent ? ?MAU Course  ?Procedures ?Results for orders placed or performed during the hospital encounter of 12/23/21 (from the past 24 hour(s))  ?Urinalysis, Routine w reflex microscopic     Status: Abnormal  ? Collection Time: 12/23/21  9:17 PM  ?Result Value Ref Range  ? Color, Urine YELLOW YELLOW  ? APPearance CLOUDY (A) CLEAR  ? Specific Gravity, Urine 1.021 1.005 - 1.030  ? pH 6.0 5.0 - 8.0  ? Glucose, UA NEGATIVE NEGATIVE mg/dL  ? Hgb urine dipstick NEGATIVE NEGATIVE  ? Bilirubin Urine NEGATIVE NEGATIVE  ? Ketones, ur 5 (A) NEGATIVE mg/dL  ? Protein, ur NEGATIVE NEGATIVE mg/dL  ? Nitrite NEGATIVE NEGATIVE  ? Leukocytes,Ua NEGATIVE NEGATIVE  ? ? ? ? ?Transfer of care given to [name], NP. Pt in stable  condition.  ?MDM ?Carlynn Herald, MSN, CNM ?12/23/2021 2200 ? ?Contraction x 1 per TOCO. Cervix closed/thick. U/a negative. Offered patient IV vs oral fluids, procardia, & f/u exam. Patient's preference is to be discharged home. She is stable for discharge.  ? ?Assessment and Plan  ? ?1. Braxton Hicks contractions   ?2. [redacted] weeks gestation of pregnancy   ? ?-Reviewed preterm labor precautions & reasons to return to MAU ?-Keep f/u with [facility] next week ? ?[name], NP  ?

## 2021-12-23 NOTE — MAU Note (Signed)
Pt says had appointment last Wed - did U/s ?Has been having dull pain since then -did not call office-  ?She feels UC's - back hurts, - pain - 9 . ?Says vag hurt- worse when she walks  ?Last sex- Sat  ?

## 2022-01-18 ENCOUNTER — Other Ambulatory Visit: Payer: Self-pay

## 2022-01-18 ENCOUNTER — Inpatient Hospital Stay (HOSPITAL_COMMUNITY)
Admission: AD | Admit: 2022-01-18 | Discharge: 2022-01-18 | Disposition: A | Discharge status: Home / Self Care | Payer: Medicaid Other | Attending: Obstetrics and Gynecology | Admitting: Obstetrics and Gynecology

## 2022-01-18 ENCOUNTER — Encounter (HOSPITAL_COMMUNITY): Payer: Self-pay | Admitting: Obstetrics and Gynecology

## 2022-01-18 DIAGNOSIS — O479 False labor, unspecified: Secondary | ICD-10-CM | POA: Diagnosis not present

## 2022-01-18 DIAGNOSIS — O4703 False labor before 37 completed weeks of gestation, third trimester: Secondary | ICD-10-CM | POA: Insufficient documentation

## 2022-01-18 DIAGNOSIS — Z3A36 36 weeks gestation of pregnancy: Secondary | ICD-10-CM | POA: Diagnosis not present

## 2022-01-18 NOTE — MAU Provider Note (Signed)
S: Patient is here for RN labor evaluation. Fetal tracing, vital signs, & chart reviewed   O:  Vitals:   01/18/22 1829 01/18/22 1854 01/18/22 2058  BP: 113/76 (!) 107/57 105/72  Pulse: 100 83 78  Resp: 18 18   Temp: 98.5 F (36.9 C)    TempSrc: Oral    SpO2: 99% 98%    No results found for this or any previous visit (from the past 24 hour(s)).  Dilation: 1 Effacement (%): 50 Cervical Position: Posterior Station: -3 Exam by:: Johnanna Schneiders RN   FHR: 130 bpm, Mod Var, no Decels, 15x15 Accels UC: irregular   A: 1. False labor   2. [redacted] weeks gestation of pregnancy      P:  RN to discharge home in stable condition with return precautions & fetal kick counts  [name], NP 01/18/2022  9:00 PM

## 2022-01-18 NOTE — MAU Note (Signed)
I have communicated with [name] NP and reviewed vital signs:  Vitals:   01/18/22 1854 01/18/22 2058  BP: (!) 107/57 105/72  Pulse: 83 78  Resp: 18 18  Temp:  98.2 F (36.8 C)  SpO2: 98% 99%    Vaginal exam:  Dilation: 1 Effacement (%): 50 Cervical Position: Posterior Station: -3 Exam by:: Johnanna Schneiders RN,   Also reviewed contraction pattern and that non-stress test is reactive.  It has been documented that patient is contracting every 2-6 minutes, irregularly with no cervical change over 2 hours not indicating active labor.  Patient denies any other complaints.  Based on this report provider has given order for discharge.  A discharge order and diagnosis entered by a provider.   Labor discharge instructions reviewed with patient.

## 2022-01-18 NOTE — MAU Note (Signed)
Carrie [name] is a 31 y.o. at [redacted]w[redacted]d here in MAU reporting: ongoing contractions for the past month and states they got worse in the last 1.5 hours. Unsure about frequency. No bleeding or LOF. +FM  Onset of complaint: ongoing  Pain score: 9/10  Vitals:   01/18/22 1829  BP: 113/76  Pulse: 100  Resp: 18  Temp: 98.5 F (36.9 C)  SpO2: 99%     FHT:165  Lab orders placed from triage: none

## 2022-01-21 ENCOUNTER — Ambulatory Visit: Payer: Medicaid Other | Admitting: *Deleted

## 2022-01-21 ENCOUNTER — Ambulatory Visit: Payer: Medicaid Other | Attending: Maternal & Fetal Medicine

## 2022-01-21 ENCOUNTER — Encounter: Payer: Self-pay | Admitting: *Deleted

## 2022-01-21 VITALS — BP 106/72 | HR 78

## 2022-01-21 DIAGNOSIS — Z3689 Encounter for other specified antenatal screening: Secondary | ICD-10-CM | POA: Insufficient documentation

## 2022-01-21 DIAGNOSIS — O358XX0 Maternal care for other (suspected) fetal abnormality and damage, not applicable or unspecified: Secondary | ICD-10-CM | POA: Diagnosis not present

## 2022-01-21 DIAGNOSIS — O283 Abnormal ultrasonic finding on antenatal screening of mother: Secondary | ICD-10-CM | POA: Diagnosis present

## 2022-01-21 DIAGNOSIS — Z3A37 37 weeks gestation of pregnancy: Secondary | ICD-10-CM | POA: Diagnosis not present

## 2022-01-22 LAB — OB RESULTS CONSOLE GBS: GBS: NEGATIVE

## 2022-01-23 ENCOUNTER — Encounter (HOSPITAL_COMMUNITY): Payer: Self-pay

## 2022-01-23 NOTE — Patient Instructions (Addendum)
Carrie [name]  01/23/2022   Your procedure is scheduled on:  02/05/2022  Arrive at 0900 at Entrance C on [address] at [facility]  and [employer]. You are invited to use the FREE valet parking or use the Visitor's parking deck.  Pick up the phone at the desk and dial [phone].  Call this number if you have problems the morning of surgery: [phone]  Remember:   Do not eat food:(After Midnight) Desps de medianoche.  Do not drink clear liquids: (After Midnight) Desps de medianoche.  Take these medicines the morning of surgery with A SIP OF WATER:  Take your prozac as prescribed   Do not wear jewelry, make-up or nail polish.  Do not wear lotions, powders, or perfumes. Do not wear deodorant.  Do not shave 48 hours prior to surgery.  Do not bring valuables to the hospital.  [facility] is not   responsible for any belongings or valuables brought to the hospital.  Contacts, dentures or bridgework may not be worn into surgery.  Leave suitcase in the car. After surgery it may be brought to your room.  For patients admitted to the hospital, checkout time is 11:00 AM the day of              discharge.      Please read over the following fact sheets that you were given:     Preparing for Surgery

## 2022-01-25 ENCOUNTER — Other Ambulatory Visit: Payer: Self-pay

## 2022-01-25 ENCOUNTER — Encounter (HOSPITAL_COMMUNITY): Payer: Self-pay | Admitting: Obstetrics and Gynecology

## 2022-01-25 ENCOUNTER — Inpatient Hospital Stay (HOSPITAL_COMMUNITY)
Admission: AD | Admit: 2022-01-25 | Discharge: 2022-01-25 | Disposition: A | Discharge status: Home / Self Care | Payer: Medicaid Other | Attending: Obstetrics and Gynecology | Admitting: Obstetrics and Gynecology

## 2022-01-25 DIAGNOSIS — Z3689 Encounter for other specified antenatal screening: Secondary | ICD-10-CM | POA: Insufficient documentation

## 2022-01-25 DIAGNOSIS — O99513 Diseases of the respiratory system complicating pregnancy, third trimester: Secondary | ICD-10-CM | POA: Diagnosis not present

## 2022-01-25 DIAGNOSIS — J029 Acute pharyngitis, unspecified: Secondary | ICD-10-CM | POA: Insufficient documentation

## 2022-01-25 DIAGNOSIS — O99891 Other specified diseases and conditions complicating pregnancy: Secondary | ICD-10-CM | POA: Insufficient documentation

## 2022-01-25 DIAGNOSIS — Z3A37 37 weeks gestation of pregnancy: Secondary | ICD-10-CM | POA: Insufficient documentation

## 2022-01-25 DIAGNOSIS — R590 Localized enlarged lymph nodes: Secondary | ICD-10-CM | POA: Insufficient documentation

## 2022-01-25 NOTE — MAU Provider Note (Signed)
History     [id number]  Arrival date and time: 01/25/22 1518    Chief Complaint  Patient presents with   Mass   Contractions     HPI Carrie [name] is a 31 y.o. at [redacted]w[redacted]d who presents for right axillary swelling & contractions. Reports continues to have contractions. Doesn't note frequency. Denies LOF or vaginal bleeding. Reports good fetal movement.  This morning she felt a lump in her right axilla. Area is tender. Denies any arm pain, breast pain, known wounds. Had a mild sore throat this morning. Was sweating this morning but didn't check temperature. Denies chills, cough.    OB History     Gravida  4   Para  1   Term  1   Preterm  0   AB  2   Living  1      SAB  0   IAB  1   Ectopic  1   Multiple  0   Live Births  1           Past Medical History:  Diagnosis Date   Anxiety    hx of meds   Heart murmur    at birth   Infection    UTI    Past Surgical History:  Procedure Laterality Date   WISDOM TOOTH EXTRACTION      Family History  Problem Relation Age of Onset   Hypertension Father    Hyperlipidemia Father    Deep vein thrombosis Father        turning into a PE   Diabetes Maternal Grandfather    Cancer Paternal Grandmother    Cancer Paternal Grandfather    Stroke Paternal Grandfather    Heart disease Paternal Grandfather    Hearing loss Neg Hx     Allergies  Allergen Reactions   Codeine Nausea Only    Raises body temperature    No current facility-administered medications on file prior to encounter.   Current Outpatient Medications on File Prior to Encounter  Medication Sig Dispense Refill   Docosahexaenoic Acid (DHA COMPLETE PO) Take by mouth.     FLUoxetine (PROZAC) 20 MG capsule Take 1 capsule (20 mg total) by mouth daily. 90 capsule 1   Prenatal Vit-Fe Fumarate-FA (PRENATAL VITAMINS PO) Take by mouth.       ROS Pertinent positives and negative per HPI, all others reviewed and negative  Physical Exam   BP 124/70  (BP Location: Left Arm)   Pulse 83   Temp 97.9 F (36.6 C) (Oral)   Resp 18   Ht 5\' 5"  (1.651 m)   Wt 95.8 kg   LMP 04/25/2021   SpO2 100%   BMI 35.13 kg/m   Patient Vitals for the past 24 hrs:  BP Temp Temp src Pulse Resp SpO2 Height Weight  01/25/22 1700 124/70 97.9 F (36.6 C) Oral 83 18 100 % -- --  01/25/22 1546 112/73 98.2 F (36.8 C) Oral 76 18 96 % -- --  01/25/22 1531 109/72 97.7 F (36.5 C) Oral 77 16 97 % -- --  01/25/22 1524 -- -- -- -- -- -- 5\' 5"  (1.651 m) 95.8 kg    Physical Exam Vitals and nursing note reviewed. Exam conducted with a chaperone present.  Constitutional:      General: She is not in acute distress.    Appearance: Normal appearance. She is not ill-appearing, toxic-appearing or diaphoretic.  HENT:     Head: Normocephalic and atraumatic.  Mouth/Throat:     Pharynx: Uvula midline. No oropharyngeal exudate, posterior oropharyngeal erythema or uvula swelling.  Eyes:     General: No scleral icterus.    Conjunctiva/sclera: Conjunctivae normal.     Pupils: Pupils are equal, round, and reactive to light.  Neck:     Thyroid: No thyroid mass.  Pulmonary:     Effort: Pulmonary effort is normal. No respiratory distress.  Chest:  Breasts:    Breasts are symmetrical.     Right: No mass, nipple discharge, skin change or tenderness.     Left: No mass, nipple discharge, skin change or tenderness.  Musculoskeletal:     Cervical back: Neck supple.  Lymphadenopathy:     Cervical: No cervical adenopathy.     Upper Body:     Right upper body: No supraclavicular or axillary adenopathy.     Left upper body: Axillary adenopathy (soft, regularly shaped, mobile, tender) present. No supraclavicular adenopathy.  Skin:    General: Skin is warm and dry.  Neurological:     Mental Status: She is alert.  Psychiatric:        Mood and Affect: Mood normal.        Behavior: Behavior normal.     Cervical Exam Dilation: 1.5 Effacement (%): 50 Exam by::  J.Bellamy, RN   FHT Baseline 125, moderate variability, 15x15 accels, no decels Toco: none Cat: 1  Labs No results found for this or any previous visit (from the past 24 hour(s)).  Imaging No results found.  MAU Course  Procedures Lab Orders         Urinalysis, Routine w reflex microscopic Urine, Clean Catch    No orders of the defined types were placed in this encounter.  Imaging Orders  No imaging studies ordered today    MDM Reactive NST. No regular contractions. Cervix unchanged from previous check in the office.   Left axillary lymphadenopathy. No obvious infection on exam. She is afebrile. Swelling started this morning. Offered watchful waiting vs antibiotics - patient prefers waiting & following up with her OB next week.  Assessment and Plan   1. Axillary lymphadenopathy   2. [redacted] weeks gestation of pregnancy    -Call office or return for worsening symptoms and/or fever -Tylenol prn pain -warm compresses -Keep f/u with OB on Wednesday  [name], NP 01/25/22 5:08 PM

## 2022-01-25 NOTE — MAU Note (Signed)
Carrie [name] is a 31 y.o. at [redacted]w[redacted]d here in MAU reporting: woke up with lump under her right armpit. States area is tender to touch. States had a fever at home that was 101, has not taken any tylenol. Also noted swelling in her legs. Also ongoing contractions. No bleeding or LOF. +FM.   Onset of complaint: today  Pain score: contractions 8/10, armpit 6/10  Vitals:   01/25/22 1531  BP: 109/72  Pulse: 77  Resp: 16  Temp: 97.7 F (36.5 C)  SpO2: 97%     FHT:144  Lab orders placed from triage: UA

## 2022-02-02 ENCOUNTER — Other Ambulatory Visit: Payer: Self-pay

## 2022-02-02 ENCOUNTER — Inpatient Hospital Stay (HOSPITAL_COMMUNITY): Payer: Medicaid Other | Admitting: Anesthesiology

## 2022-02-02 ENCOUNTER — Encounter (HOSPITAL_COMMUNITY): Payer: Self-pay | Admitting: Obstetrics and Gynecology

## 2022-02-02 ENCOUNTER — Inpatient Hospital Stay (HOSPITAL_COMMUNITY)
Admission: AD | Admit: 2022-02-02 | Discharge: 2022-02-06 | DRG: 788 | Disposition: A | Discharge status: Home / Self Care | Payer: Medicaid Other | Attending: Obstetrics and Gynecology | Admitting: Obstetrics and Gynecology

## 2022-02-02 ENCOUNTER — Encounter (HOSPITAL_COMMUNITY)
Admission: AD | Disposition: A | Discharge status: Home / Self Care | Payer: Self-pay | Source: Home / Self Care | Attending: Obstetrics and Gynecology

## 2022-02-02 DIAGNOSIS — O3663X0 Maternal care for excessive fetal growth, third trimester, not applicable or unspecified: Secondary | ICD-10-CM | POA: Diagnosis present

## 2022-02-02 DIAGNOSIS — O99344 Other mental disorders complicating childbirth: Secondary | ICD-10-CM

## 2022-02-02 DIAGNOSIS — Z3A38 38 weeks gestation of pregnancy: Secondary | ICD-10-CM

## 2022-02-02 DIAGNOSIS — F419 Anxiety disorder, unspecified: Secondary | ICD-10-CM | POA: Diagnosis present

## 2022-02-02 DIAGNOSIS — F32A Depression, unspecified: Secondary | ICD-10-CM | POA: Diagnosis present

## 2022-02-02 DIAGNOSIS — O358XX0 Maternal care for other (suspected) fetal abnormality and damage, not applicable or unspecified: Secondary | ICD-10-CM | POA: Diagnosis present

## 2022-02-02 DIAGNOSIS — O99214 Obesity complicating childbirth: Secondary | ICD-10-CM | POA: Diagnosis present

## 2022-02-02 DIAGNOSIS — Z98891 History of uterine scar from previous surgery: Secondary | ICD-10-CM

## 2022-02-02 LAB — URINALYSIS, ROUTINE W REFLEX MICROSCOPIC
Bacteria, UA: NONE SEEN
Bilirubin Urine: NEGATIVE
Glucose, UA: NEGATIVE mg/dL
Ketones, ur: 80 mg/dL — AB
Nitrite: NEGATIVE
Protein, ur: NEGATIVE mg/dL
RBC / HPF: >50 RBC/hpf — ABNORMAL HIGH (ref 0–5)
Specific Gravity, Urine: 1.021 (ref 1.005–1.030)
pH: 6 (ref 5.0–8.0)

## 2022-02-02 LAB — CBC
HCT: 41.4 % (ref 36.0–46.0)
Hemoglobin: 14.1 g/dL (ref 12.0–15.0)
MCH: 31.9 pg (ref 26.0–34.0)
MCHC: 34.1 g/dL (ref 30.0–36.0)
MCV: 93.7 fL (ref 80.0–100.0)
Platelets: 218 10*3/uL (ref 150–400)
RBC: 4.42 MIL/uL (ref 3.87–5.11)
RDW: 12.6 % (ref 11.5–15.5)
WBC: 9.5 10*3/uL (ref 4.0–10.5)
nRBC: 0 % (ref 0.0–0.2)

## 2022-02-02 LAB — TYPE AND SCREEN
ABO/RH(D): O POS
Antibody Screen: NEGATIVE

## 2022-02-02 SURGERY — Surgical Case
Anesthesia: Spinal

## 2022-02-02 MED ORDER — LACTATED RINGERS IV BOLUS: 1000.0000 mL | Freq: Once | INTRAVENOUS | Status: DC

## 2022-02-02 MED ORDER — MORPHINE SULFATE (PF) 0.5 MG/ML IJ SOLN
INTRAMUSCULAR | Status: DC | PRN
  Administered 2022-02-02: 150 ug via INTRATHECAL

## 2022-02-02 MED ORDER — DEXMEDETOMIDINE HCL IN NACL 80 MCG/20ML IV SOLN
INTRAVENOUS | Status: AC
  Filled 2022-02-02: qty 20

## 2022-02-02 MED ORDER — NALOXONE HCL 4 MG/10ML IJ SOLN: 1.0000 ug/kg/h | INTRAVENOUS | Status: DC | PRN

## 2022-02-02 MED ORDER — LACTATED RINGERS IV SOLN: INTRAVENOUS | Status: DC

## 2022-02-02 MED ORDER — OXYTOCIN-SODIUM CHLORIDE 30-0.9 UT/500ML-% IV SOLN
2.5000 [IU]/h | INTRAVENOUS | Status: AC
  Administered 2022-02-02 (×2): 2.5 [IU]/h via INTRAVENOUS
  Filled 2022-02-02: qty 500

## 2022-02-02 MED ORDER — MEPERIDINE HCL 25 MG/ML IJ SOLN: 6.2500 mg | INTRAMUSCULAR | Status: DC | PRN

## 2022-02-02 MED ORDER — PHENYLEPHRINE 80 MCG/ML (10ML) SYRINGE FOR IV PUSH (FOR BLOOD PRESSURE SUPPORT)
PREFILLED_SYRINGE | INTRAVENOUS | Status: AC
  Filled 2022-02-02: qty 10

## 2022-02-02 MED ORDER — ZOLPIDEM TARTRATE 5 MG PO TABS: 5.0000 mg | ORAL_TABLET | Freq: Every evening | ORAL | Status: DC | PRN

## 2022-02-02 MED ORDER — METHYLERGONOVINE MALEATE 0.2 MG/ML IJ SOLN: 0.2000 mg | INTRAMUSCULAR | Status: DC | PRN

## 2022-02-02 MED ORDER — ONDANSETRON HCL 4 MG/2ML IJ SOLN
INTRAMUSCULAR | Status: AC
  Filled 2022-02-02: qty 2

## 2022-02-02 MED ORDER — SOD CITRATE-CITRIC ACID 500-334 MG/5ML PO SOLN
30.0000 mL | Freq: Once | ORAL | Status: AC
  Administered 2022-02-02: 30 mL via ORAL
  Filled 2022-02-02: qty 30

## 2022-02-02 MED ORDER — PRENATAL MULTIVITAMIN CH
1.0000 | ORAL_TABLET | Freq: Every day | ORAL | Status: DC
  Administered 2022-02-03 – 2022-02-05 (×3): 1 via ORAL
  Filled 2022-02-02 (×3): qty 1

## 2022-02-02 MED ORDER — SENNOSIDES-DOCUSATE SODIUM 8.6-50 MG PO TABS
2.0000 | ORAL_TABLET | ORAL | Status: DC
  Administered 2022-02-03 – 2022-02-04 (×2): 2 via ORAL
  Filled 2022-02-02 (×2): qty 2

## 2022-02-02 MED ORDER — SCOPOLAMINE 1 MG/3DAYS TD PT72
MEDICATED_PATCH | TRANSDERMAL | Status: DC | PRN
  Administered 2022-02-02: 1 via TRANSDERMAL

## 2022-02-02 MED ORDER — OXYCODONE HCL 5 MG PO TABS: 5.0000 mg | ORAL_TABLET | Freq: Once | ORAL | Status: DC | PRN

## 2022-02-02 MED ORDER — STERILE WATER FOR IRRIGATION IR SOLN
Status: DC | PRN
  Administered 2022-02-02: 1

## 2022-02-02 MED ORDER — OXYCODONE HCL 5 MG PO TABS
5.0000 mg | ORAL_TABLET | ORAL | Status: DC | PRN
  Administered 2022-02-04 (×2): 5 mg via ORAL
  Filled 2022-02-02 (×2): qty 1

## 2022-02-02 MED ORDER — FENTANYL CITRATE (PF) 100 MCG/2ML IJ SOLN
INTRAMUSCULAR | Status: DC | PRN
  Administered 2022-02-02: 15 ug via INTRATHECAL

## 2022-02-02 MED ORDER — DEXAMETHASONE SODIUM PHOSPHATE 4 MG/ML IJ SOLN
INTRAMUSCULAR | Status: DC | PRN
  Administered 2022-02-02: 8 mg via INTRAVENOUS

## 2022-02-02 MED ORDER — PHENYLEPHRINE HCL (PRESSORS) 10 MG/ML IV SOLN
INTRAVENOUS | Status: DC | PRN
  Administered 2022-02-02: 160 ug via INTRAVENOUS
  Administered 2022-02-02: 240 ug via INTRAVENOUS
  Administered 2022-02-02: 160 ug via INTRAVENOUS

## 2022-02-02 MED ORDER — SIMETHICONE 80 MG PO CHEW
80.0000 mg | CHEWABLE_TABLET | ORAL | Status: DC | PRN
  Administered 2022-02-03 – 2022-02-04 (×5): 80 mg via ORAL
  Filled 2022-02-02 (×5): qty 1

## 2022-02-02 MED ORDER — CEFAZOLIN SODIUM-DEXTROSE 2-4 GM/100ML-% IV SOLN
2.0000 g | INTRAVENOUS | Status: AC
  Administered 2022-02-02: 2 g via INTRAVENOUS
  Filled 2022-02-02: qty 100

## 2022-02-02 MED ORDER — SODIUM CHLORIDE 0.9% FLUSH: 3.0000 mL | INTRAVENOUS | Status: DC | PRN

## 2022-02-02 MED ORDER — METOCLOPRAMIDE HCL 5 MG/ML IJ SOLN
INTRAMUSCULAR | Status: AC
  Filled 2022-02-02: qty 2

## 2022-02-02 MED ORDER — FAMOTIDINE IN NACL 20-0.9 MG/50ML-% IV SOLN
20.0000 mg | Freq: Once | INTRAVENOUS | Status: AC
  Administered 2022-02-02: 20 mg via INTRAVENOUS
  Filled 2022-02-02: qty 50

## 2022-02-02 MED ORDER — KETOROLAC TROMETHAMINE 30 MG/ML IJ SOLN
30.0000 mg | Freq: Four times a day (QID) | INTRAMUSCULAR | Status: AC | PRN
Start: 2022-02-02 — End: 2022-02-03

## 2022-02-02 MED ORDER — COCONUT OIL OIL: 1.0000 "application " | TOPICAL_OIL | Status: DC | PRN

## 2022-02-02 MED ORDER — TETANUS-DIPHTH-ACELL PERTUSSIS 5-2.5-18.5 LF-MCG/0.5 IM SUSY: 0.5000 mL | PREFILLED_SYRINGE | Freq: Once | INTRAMUSCULAR | Status: DC

## 2022-02-02 MED ORDER — METHYLERGONOVINE MALEATE 0.2 MG PO TABS: 0.2000 mg | ORAL_TABLET | ORAL | Status: DC | PRN

## 2022-02-02 MED ORDER — OXYTOCIN-SODIUM CHLORIDE 30-0.9 UT/500ML-% IV SOLN
INTRAVENOUS | Status: DC | PRN
  Administered 2022-02-02: 400 mL via INTRAVENOUS

## 2022-02-02 MED ORDER — DIPHENHYDRAMINE HCL 50 MG/ML IJ SOLN: 12.5000 mg | INTRAMUSCULAR | Status: DC | PRN

## 2022-02-02 MED ORDER — SODIUM CHLORIDE 0.9 % IR SOLN
Status: DC | PRN
  Administered 2022-02-02: 1

## 2022-02-02 MED ORDER — FLUOXETINE HCL 20 MG PO CAPS
20.0000 mg | ORAL_CAPSULE | Freq: Every day | ORAL | Status: DC
  Administered 2022-02-02 – 2022-02-06 (×5): 20 mg via ORAL
  Filled 2022-02-02 (×5): qty 1

## 2022-02-02 MED ORDER — FENTANYL CITRATE (PF) 100 MCG/2ML IJ SOLN
INTRAMUSCULAR | Status: AC
  Filled 2022-02-02: qty 2

## 2022-02-02 MED ORDER — ONDANSETRON HCL 4 MG/2ML IJ SOLN
INTRAMUSCULAR | Status: DC | PRN
  Administered 2022-02-02: 4 mg via INTRAVENOUS

## 2022-02-02 MED ORDER — SCOPOLAMINE 1 MG/3DAYS TD PT72
1.0000 | MEDICATED_PATCH | Freq: Once | TRANSDERMAL | Status: AC
  Administered 2022-02-02: 1.5 mg via TRANSDERMAL

## 2022-02-02 MED ORDER — OXYCODONE HCL 5 MG/5ML PO SOLN: 5.0000 mg | Freq: Once | ORAL | Status: DC | PRN

## 2022-02-02 MED ORDER — WITCH HAZEL-GLYCERIN EX PADS: 1.0000 "application " | MEDICATED_PAD | CUTANEOUS | Status: DC | PRN

## 2022-02-02 MED ORDER — ACETAMINOPHEN 500 MG PO TABS
1000.0000 mg | ORAL_TABLET | Freq: Four times a day (QID) | ORAL | Status: DC
  Administered 2022-02-03 – 2022-02-06 (×13): 1000 mg via ORAL
  Filled 2022-02-02 (×13): qty 2

## 2022-02-02 MED ORDER — MAGNESIUM HYDROXIDE 400 MG/5ML PO SUSP: 30.0000 mL | ORAL | Status: DC | PRN

## 2022-02-02 MED ORDER — IBUPROFEN 600 MG PO TABS
600.0000 mg | ORAL_TABLET | Freq: Four times a day (QID) | ORAL | Status: DC
  Administered 2022-02-03 – 2022-02-06 (×12): 600 mg via ORAL
  Filled 2022-02-02 (×12): qty 1

## 2022-02-02 MED ORDER — MEPERIDINE HCL 25 MG/ML IJ SOLN
INTRAMUSCULAR | Status: AC
  Filled 2022-02-02: qty 1

## 2022-02-02 MED ORDER — PHENYLEPHRINE HCL-NACL 20-0.9 MG/250ML-% IV SOLN
INTRAVENOUS | Status: AC
  Filled 2022-02-02: qty 250

## 2022-02-02 MED ORDER — OXYTOCIN-SODIUM CHLORIDE 30-0.9 UT/500ML-% IV SOLN
INTRAVENOUS | Status: AC
  Filled 2022-02-02: qty 500

## 2022-02-02 MED ORDER — PHENYLEPHRINE HCL-NACL 20-0.9 MG/250ML-% IV SOLN
INTRAVENOUS | Status: DC | PRN
  Administered 2022-02-02: 60 ug/min via INTRAVENOUS

## 2022-02-02 MED ORDER — DIBUCAINE (PERIANAL) 1 % EX OINT: 1.0000 "application " | TOPICAL_OINTMENT | CUTANEOUS | Status: DC | PRN

## 2022-02-02 MED ORDER — BUPIVACAINE IN DEXTROSE 0.75-8.25 % IT SOLN
INTRATHECAL | Status: DC | PRN
  Administered 2022-02-02: 1.6 mL via INTRATHECAL

## 2022-02-02 MED ORDER — DIPHENHYDRAMINE HCL 25 MG PO CAPS: 25.0000 mg | ORAL_CAPSULE | ORAL | Status: DC | PRN

## 2022-02-02 MED ORDER — MORPHINE SULFATE (PF) 0.5 MG/ML IJ SOLN
INTRAMUSCULAR | Status: AC
  Filled 2022-02-02: qty 10

## 2022-02-02 MED ORDER — MEASLES, MUMPS & RUBELLA VAC IJ SOLR: 0.5000 mL | Freq: Once | INTRAMUSCULAR | Status: DC

## 2022-02-02 MED ORDER — DEXAMETHASONE SODIUM PHOSPHATE 4 MG/ML IJ SOLN
INTRAMUSCULAR | Status: AC
  Filled 2022-02-02: qty 2

## 2022-02-02 MED ORDER — FENTANYL CITRATE (PF) 100 MCG/2ML IJ SOLN: 25.0000 ug | INTRAMUSCULAR | Status: DC | PRN

## 2022-02-02 MED ORDER — ACETAMINOPHEN 10 MG/ML IV SOLN
INTRAVENOUS | Status: DC | PRN
  Administered 2022-02-02: 1000 mg via INTRAVENOUS

## 2022-02-02 MED ORDER — ENOXAPARIN SODIUM 60 MG/0.6ML IJ SOSY
50.0000 mg | PREFILLED_SYRINGE | INTRAMUSCULAR | Status: DC
  Administered 2022-02-03 – 2022-02-04 (×2): 50 mg via SUBCUTANEOUS
  Filled 2022-02-02 (×3): qty 0.6

## 2022-02-02 MED ORDER — KETOROLAC TROMETHAMINE 30 MG/ML IJ SOLN
30.0000 mg | Freq: Four times a day (QID) | INTRAMUSCULAR | Status: AC
  Administered 2022-02-02 – 2022-02-03 (×2): 30 mg via INTRAVENOUS
  Filled 2022-02-02 (×2): qty 1

## 2022-02-02 MED ORDER — MEPERIDINE HCL 25 MG/ML IJ SOLN
INTRAMUSCULAR | Status: DC | PRN
  Administered 2022-02-02 (×2): 12.5 mg via INTRAVENOUS

## 2022-02-02 MED ORDER — METOCLOPRAMIDE HCL 5 MG/ML IJ SOLN
10.0000 mg | Freq: Once | INTRAMUSCULAR | Status: DC | PRN
Start: 2022-02-02 — End: 2022-02-02

## 2022-02-02 MED ORDER — NALOXONE HCL 0.4 MG/ML IJ SOLN: 0.4000 mg | INTRAMUSCULAR | Status: DC | PRN

## 2022-02-02 MED ORDER — DEXMEDETOMIDINE (PRECEDEX) IN NS 20 MCG/5ML (4 MCG/ML) IV SYRINGE
PREFILLED_SYRINGE | INTRAVENOUS | Status: DC | PRN
  Administered 2022-02-02: 12 ug via INTRAVENOUS

## 2022-02-02 MED ORDER — DIPHENHYDRAMINE HCL 25 MG PO CAPS: 25.0000 mg | ORAL_CAPSULE | Freq: Four times a day (QID) | ORAL | Status: DC | PRN

## 2022-02-02 MED ORDER — MENTHOL 3 MG MT LOZG: 1.0000 | LOZENGE | OROMUCOSAL | Status: DC | PRN

## 2022-02-02 MED ORDER — ONDANSETRON HCL 4 MG/2ML IJ SOLN: 4.0000 mg | Freq: Three times a day (TID) | INTRAMUSCULAR | Status: DC | PRN

## 2022-02-02 SURGICAL SUPPLY — 31 items
BENZOIN TINCTURE PRP APPL 2/3 (GAUZE/BANDAGES/DRESSINGS) ×1 IMPLANT
CHLORAPREP W/TINT 26ML (MISCELLANEOUS) ×4 IMPLANT
CLAMP CORD UMBIL (MISCELLANEOUS) ×2 IMPLANT
CLOTH BEACON ORANGE TIMEOUT ST (SAFETY) ×2 IMPLANT
DRSG OPSITE POSTOP 4X10 (GAUZE/BANDAGES/DRESSINGS) ×2 IMPLANT
ELECT REM PT RETURN 9FT ADLT (ELECTROSURGICAL) ×2
ELECTRODE REM PT RTRN 9FT ADLT (ELECTROSURGICAL) ×1 IMPLANT
EXTRACTOR VACUUM KIWI (MISCELLANEOUS) IMPLANT
EXTRACTOR VACUUM M CUP 4 TUBE (SUCTIONS) IMPLANT
GLOVE BIOGEL PI IND STRL 7.0 (GLOVE) ×1 IMPLANT
GLOVE BIOGEL PI INDICATOR 7.0 (GLOVE) ×1
GLOVE ORTHO TXT STRL SZ7.5 (GLOVE) ×2 IMPLANT
GOWN STRL REUS W/TWL LRG LVL3 (GOWN DISPOSABLE) ×4 IMPLANT
KIT ABG SYR 3ML LUER SLIP (SYRINGE) IMPLANT
NDL HYPO 25X5/8 SAFETYGLIDE (NEEDLE) ×1 IMPLANT
NEEDLE HYPO 25X5/8 SAFETYGLIDE (NEEDLE) ×2 IMPLANT
NS IRRIG 1000ML POUR BTL (IV SOLUTION) ×2 IMPLANT
PACK C SECTION WH (CUSTOM PROCEDURE TRAY) ×2 IMPLANT
PAD OB MATERNITY 4.3X12.25 (PERSONAL CARE ITEMS) ×2 IMPLANT
RTRCTR C-SECT PINK 25CM LRG (MISCELLANEOUS) ×2 IMPLANT
STRIP CLOSURE SKIN 1/2X4 (GAUZE/BANDAGES/DRESSINGS) ×1 IMPLANT
SUT CHROMIC 1 CTX 36 (SUTURE) ×4 IMPLANT
SUT PLAIN 0 NONE (SUTURE) IMPLANT
SUT PLAIN 2 0 XLH (SUTURE) IMPLANT
SUT VIC AB 0 CT1 27 (SUTURE) ×4
SUT VIC AB 0 CT1 27XBRD ANBCTR (SUTURE) ×2 IMPLANT
SUT VIC AB 2-0 CT1 (SUTURE) ×2 IMPLANT
SUT VIC AB 4-0 KS 27 (SUTURE) IMPLANT
TOWEL OR 17X24 6PK STRL BLUE (TOWEL DISPOSABLE) ×2 IMPLANT
TRAY FOLEY W/BAG SLVR 14FR LF (SET/KITS/TRAYS/PACK) ×2 IMPLANT
WATER STERILE IRR 1000ML POUR (IV SOLUTION) ×2 IMPLANT

## 2022-02-02 NOTE — MAU Note (Signed)
Notified Venia Carbon, NP pt;s cervical change and pt being more uncomfortable.  Provider agreed to recheck pt in one hour to see if cx's will cause change in cervix.    Leonette Nutting  02/02/22

## 2022-02-02 NOTE — H&P (Signed)
Carrie [name] is a 31 y.o. female, G4 P1021, EGA 38+ weeks with [facility] 6-19 presenting for ctx and spotting.  Having ctx off and on since yesterday, stronger and closer now.  In MAU over several hours cervix changed from 2 to 3 cm, she is getting more uncomfortable with ctx.  She is scheduled for primary c-section in 3 days for suspected fetal macrosomia.  PNC otherwise complicated by anxiety/depression managed with prozac, fetal splenic cyst.  OB History     Gravida  4   Para  1   Term  1   Preterm  0   AB  2   Living  1      SAB  0   IAB  1   Ectopic  1   Multiple  0   Live Births  1          Past Medical History:  Diagnosis Date   Anxiety    hx of meds   Heart murmur    at birth   Infection    UTI   Past Surgical History:  Procedure Laterality Date   WISDOM TOOTH EXTRACTION     Family History: family history includes Cancer in her paternal grandfather and paternal grandmother; Deep vein thrombosis in her father; Diabetes in her maternal grandfather; Heart disease in her paternal grandfather; Hyperlipidemia in her father; Hypertension in her father; Stroke in her paternal grandfather. Social History:  reports that she has never smoked. She has quit using smokeless tobacco. She reports that she does not drink alcohol and does not use drugs.     Maternal Diabetes: No Genetic Screening: Normal Maternal Ultrasounds/Referrals: Other: Fetal Ultrasounds or other Referrals:  Referred to Materal Fetal Medicine  Maternal Substance Abuse:  No Significant Maternal Medications:  Meds include: Prozac Significant Maternal Lab Results:  Group B Strep negative Other Comments:   fetal splenic cyst  Review of Systems  Respiratory: Negative.    Cardiovascular: Negative.    Maternal Medical History:  Reason for admission: Contractions.   Contractions: Frequency: regular.   Perceived severity is moderate.   Fetal activity: Perceived fetal activity is normal.   Prenatal  complications: no prenatal complications Prenatal Complications - Diabetes: none.   Dilation: 3 Effacement (%): 80 Station: -3 Exam by:: J.Bellamy,RN Blood pressure 110/77, pulse 75, temperature 98.8 F (37.1 C), temperature source Oral, resp. rate 17, last menstrual period 04/25/2021, SpO2 99 %. Maternal Exam:  Uterine Assessment: Contraction strength is moderate.  Contraction frequency is regular.  Abdomen: Patient reports no abdominal tenderness. Estimated fetal weight is 9+ lbs.   Fetal presentation: vertex Introitus: Normal vulva. Normal vagina.  Amniotic fluid character: not assessed.   Fetal Exam Fetal Monitor Review: Mode: ultrasound.   Baseline rate: 120.  Variability: moderate (6-25 bpm).   Pattern: accelerations present and no decelerations.   Fetal State Assessment: Category I - tracings are normal.   Physical Exam Vitals reviewed.  Constitutional:      Appearance: Normal appearance.  Cardiovascular:     Rate and Rhythm: Normal rate and regular rhythm.  Pulmonary:     Effort: Pulmonary effort is normal. No respiratory distress.  Abdominal:     Palpations: Abdomen is soft.  Genitourinary:    General: Normal vulva.  Musculoskeletal:     Cervical back: Normal range of motion and neck supple.  Neurological:     Mental Status: She is alert.     Prenatal labs: ABO, Rh: O/Positive/-- (11/23 0000) Antibody: Negative (11/23 0000)  Rubella: Immune (11/23 0000) RPR: Nonreactive (11/23 0000)  HBsAg: Negative (11/23 0000)  HIV: Non-reactive (11/23 0000)  GBS: Negative/-- (05/31 0000)   Assessment/Plan: IUP at 38+ weeks, latent labor, suspected fetal macrosomia, fetal splenic cyst.  She is scheduled for c-section in 3 days, recommended we proceed today.  Procedure discussed, questions answered.  OR notified, will proceed with c-section when they are ready   Zenaida Niece 02/02/2022, 3:24 PM

## 2022-02-02 NOTE — Anesthesia Preprocedure Evaluation (Addendum)
Anesthesia Evaluation  Patient identified by MRN, date of birth, ID band Patient awake    Reviewed: Allergy & Precautions, NPO status , Patient's Chart, lab work & pertinent test results  History of Anesthesia Complications Negative for: history of anesthetic complications  Airway Mallampati: II   Neck ROM: Full    Dental   Pulmonary neg pulmonary ROS,    Pulmonary exam normal        Cardiovascular negative cardio ROS Normal cardiovascular exam     Neuro/Psych PSYCHIATRIC DISORDERS Anxiety negative neurological ROS     GI/Hepatic negative GI ROS, Neg liver ROS,   Endo/Other   Obesity   Renal/GU negative Renal ROS     Musculoskeletal negative musculoskeletal ROS (+)   Abdominal   Peds  Hematology negative hematology ROS (+)   Anesthesia Other Findings   Reproductive/Obstetrics (+) Pregnancy                            Anesthesia Physical Anesthesia Plan  ASA: 2  Anesthesia Plan: Spinal   Post-op Pain Management: Regional block*   Induction:   PONV Risk Score and Plan: 2 and Treatment may vary due to age or medical condition, Ondansetron and Scopolamine patch - Pre-op  Airway Management Planned: Natural Airway  Additional Equipment: None  Intra-op Plan:   Post-operative Plan:   Informed Consent: I have reviewed the patients History and Physical, chart, labs and discussed the procedure including the risks, benefits and alternatives for the proposed anesthesia with the patient or authorized representative who has indicated his/her understanding and acceptance.       Plan Discussed with: CRNA and Anesthesiologist  Anesthesia Plan Comments: (Labs reviewed, platelets acceptable. Discussed risks and benefits of spinal, including spinal/epidural hematoma, infection, failed block, and PDPH. Patient expressed understanding and wished to proceed. )       Anesthesia Quick  Evaluation

## 2022-02-02 NOTE — MAU Note (Signed)
.  Carrie [name] is a 31 y.o. at [redacted]w[redacted]d here in MAU reporting: ctx q 3-5 min since 0500 this morning.  Denies LOF, reports some spotting  Onset of complaint: 0500 Pain score: 9/10 There were no vitals filed for this visit.   FHT:120

## 2022-02-02 NOTE — Op Note (Signed)
Preoperative diagnosis: Intrauterine pregnancy at 73 weeks, latent labor, suspected fetal macrosomia Postoperative diagnosis: Same Procedure: Primary low transverse cesarean section without extensions Surgeon: Cheri Fowler M.D. Anesthesia: Spinal  Findings: Patient had normal gravid anatomy and delivered a viable female infant with Apgars of 6 and 7 weight pending Estimated blood loss: 720 cc Specimens: Placenta sent to labor and delivery Complications: None  Procedure in detail: The patient was taken to the operating room and placed in the sitting position. The anesthesiologist instilled spinal anesthesia.  She was then placed in the dorsosupine position with left tilt. Abdomen was then prepped and draped in the usual sterile fashion, and a foley catheter was inserted. The level of her anesthesia was found to be adequate. Abdomen was entered via a standard Pfannenstiel incision. Once the peritoneal cavity was entered the Alexis disposable self-retaining retractor was placed and good visualization was achieved. A 4 cm transverse incision was then made in the lower uterine segment pushing the bladder inferior. Once the uterine cavity was entered the incision was extended digitally, light meconium amniotic fluid. The fetal vertex was grasped and delivered through the incision atraumatically. Mouth and nares were suctioned. The remainder of the infant then delivered atraumatically. Cord was doubly clamped and cut after one minute and the infant handed to the awaiting pediatric team. Cord blood was obtained. The placenta delivered spontaneously. Uterus was wiped dry with clean lap pad and all clots and debris were removed. Uterine incision was inspected and found to be free of extensions. Uterine incision was closed in 1 layer with running locking #1 Chromic. Figure 8 of #1 Chromic placed at right angle for hemostasis.  Tubes and ovaries were inspected and found to be normal. Uterine incision was inspected  and found to be hemostatic. Bleeding from serosal edges was controlled with electrocautery. The Alexis retractor was removed. Subfascial space was irrigated and made hemostatic with electrocautery.  Fascia was closed in running fashion starting at both ends and meeting in the middle with 0 Vicryl. Subcutaneous tissue was then irrigated and made hemostatic with electrocautery, then closed with running 2-0 plain gut. Skin was closed with running 4-0 Vicryl subcuticular suture followed by steri-strips and a sterile dressing. Patient tolerated the procedure well and was taken to the recovery in stable condition. Counts were correct x2, she received Ancef 2 g IV at the beginning of the procedure and she had PAS hose on throughout the procedure. Baby to NICU with some respiratory issues.

## 2022-02-02 NOTE — Anesthesia Procedure Notes (Signed)
Spinal  Patient location during procedure: OR Start time: 02/02/2022 5:11 PM End time: 02/02/2022 5:15 PM Reason for block: surgical anesthesia Staffing Performed: anesthesiologist  Anesthesiologist: Audry Pili, MD Performed by: Audry Pili, MD Authorized by: Audry Pili, MD   Preanesthetic Checklist Completed: patient identified, IV checked, risks and benefits discussed, surgical consent, monitors and equipment checked, pre-op evaluation and timeout performed Spinal Block Patient position: sitting Prep: DuraPrep Patient monitoring: heart rate, cardiac monitor, continuous pulse ox and blood pressure Approach: midline Location: L3-4 Injection technique: single-shot Needle Needle type: Pencan  Needle gauge: 24 G Additional Notes Consent was obtained prior to the procedure with all questions answered and concerns addressed. Risks including, but not limited to, bleeding, infection, nerve damage, paralysis, failed block, inadequate analgesia, allergic reaction, high spinal, itching, and headache were discussed and the patient wished to proceed. Functioning IV was confirmed and monitors were applied. Sterile prep and drape, including hand hygiene, mask, and sterile gloves were used. The patient was positioned and the spine was prepped. The skin was anesthetized with lidocaine. Free flow of clear CSF was obtained prior to injecting local anesthetic into the CSF. The spinal needle aspirated freely following injection. The needle was carefully withdrawn. The patient tolerated the procedure well.   Renold Don, MD

## 2022-02-03 ENCOUNTER — Encounter (HOSPITAL_COMMUNITY): Payer: Self-pay | Admitting: Obstetrics and Gynecology

## 2022-02-03 ENCOUNTER — Encounter (HOSPITAL_COMMUNITY)
Admission: RE | Admit: 2022-02-03 | Discharge: 2022-02-03 | Disposition: A | Discharge status: Home / Self Care | Payer: Medicaid Other | Source: Ambulatory Visit | Attending: Obstetrics and Gynecology | Admitting: Obstetrics and Gynecology

## 2022-02-03 LAB — CBC
HCT: 33.8 % — ABNORMAL LOW (ref 36.0–46.0)
Hemoglobin: 11.5 g/dL — ABNORMAL LOW (ref 12.0–15.0)
MCH: 32 pg (ref 26.0–34.0)
MCHC: 34 g/dL (ref 30.0–36.0)
MCV: 94.2 fL (ref 80.0–100.0)
Platelets: 193 10*3/uL (ref 150–400)
RBC: 3.59 MIL/uL — ABNORMAL LOW (ref 3.87–5.11)
RDW: 12.4 % (ref 11.5–15.5)
WBC: 12.4 10*3/uL — ABNORMAL HIGH (ref 4.0–10.5)
nRBC: 0 % (ref 0.0–0.2)

## 2022-02-03 LAB — RPR: RPR Ser Ql: NONREACTIVE

## 2022-02-03 MED ORDER — HYDROMORPHONE HCL 2 MG PO TABS
1.0000 mg | ORAL_TABLET | ORAL | Status: DC | PRN
  Administered 2022-02-03 (×2): 1 mg via ORAL
  Filled 2022-02-03 (×2): qty 1

## 2022-02-03 NOTE — Progress Notes (Signed)
Subjective: Postpartum Day 1: Cesarean Delivery Carrie [name] is doing well this morning. States some incisional pain with movement but overall well controlled. Wants to avoid taking narcotics. Foley removed at 0600 and hasn't voided yet. She is ambulating and tolerating PO. Minimal lochia.  Baby in NICU  Objective: Patient Vitals for the past 24 hrs:  BP Temp Temp src Pulse Resp SpO2  02/03/22 0910 90/60 98.2 F (36.8 C) -- 78 -- 99 %  02/03/22 0548 96/76 -- -- 73 -- 97 %  02/03/22 0149 97/62 98.5 F (36.9 C) Oral (!) 58 19 97 %  02/03/22 0024 -- -- -- -- -- 98 %  02/02/22 2122 107/71 98.2 F (36.8 C) Oral 74 18 99 %  02/02/22 2013 104/64 98.1 F (36.7 C) -- 68 -- 100 %  02/02/22 1945 105/88 -- -- 65 (!) 23 100 %  02/02/22 1930 106/64 98.2 F (36.8 C) Oral 68 19 100 %  02/02/22 1915 94/62 -- -- 66 18 98 %  02/02/22 1903 101/70 -- -- -- -- --  02/02/22 1900 -- -- -- 82 20 99 %  02/02/22 1848 105/70 98.6 F (37 C) Axillary 83 15 97 %  02/02/22 1120 110/77 98.8 F (37.1 C) Oral 75 17 99 %  02/02/22 1107 112/70 97.8 F (36.6 C) Oral 67 17 100 %     Physical Exam:  General: alert, cooperative, and no distress Lochia: appropriate Uterine Fundus: firm Incision: healing well, no significant drainage, no dehiscence, no significant erythema DVT Evaluation: No evidence of DVT seen on physical exam.  Recent Labs    02/02/22 1547 02/03/22 0418  HGB 14.1 11.5*  HCT 41.4 33.8*    Assessment/Plan:  Twisha M Jorgensen [id number] POD#1 sp PCS at [redacted]w[redacted]d for LGA 1. PPC: routine PP care, follow up void 2. Rh pos 3. Desires circ. Baby in NICU with resp issues. Will plan for when he is cleared by pediatrician.   Charlett Nose 02/03/2022, 10:10 AM

## 2022-02-03 NOTE — Anesthesia Postprocedure Evaluation (Signed)
Anesthesia Post Note  Patient: EARLENE GRUETZMACHER  Procedure(s) Performed: CESAREAN SECTION     Patient location during evaluation: PACU Anesthesia Type: Spinal Level of consciousness: awake and alert Pain management: pain level controlled Vital Signs Assessment: post-procedure vital signs reviewed and stable Respiratory status: spontaneous breathing and respiratory function stable Cardiovascular status: blood pressure returned to baseline and stable Postop Assessment: spinal receding and no apparent nausea or vomiting Anesthetic complications: no   No notable events documented.  Last Vitals:  Vitals:   02/02/22 2122 02/03/22 0024  BP: 107/71   Pulse: 74   Resp: 18   Temp: 36.8 C   SpO2: 99% 98%    Last Pain:  Vitals:   02/02/22 2122  TempSrc: Oral  PainSc:    Pain Goal: Patients Stated Pain Goal: 0 (02/02/22 1108)                 Audry Pili

## 2022-02-03 NOTE — Progress Notes (Signed)
Patient screened out for psychosocial assessment since none of the following apply: Psychosocial stressors documented in mother or baby's chart Gestation less than 32 weeks Code at delivery  Infant with anomalies Please contact the Clinical Social Worker if specific needs arise or by MOB's request.    MOB's Edinburgh Score is 2.  Donnalynn Wheeless Boyd-Gilyard, MSW, LCSW Clinical Social Work [phone]   

## 2022-02-03 NOTE — Progress Notes (Signed)
Patients pain is controlled. Pain meds that are available were talked about. Patient told the plan of care. No complaints at this time.

## 2022-02-03 NOTE — Lactation Note (Signed)
This note was copied from a baby's chart. Lactation Consultation Note Mother plans to formula feed only. LC services are complete at this time.   Patient Name: Carrie [name] [id number] Date: 02/03/2022   Age:32 hours   Feeding Nipple Type: Nfant Slow Flow (purple)   Elder Negus 02/03/2022, 10:43 AM

## 2022-02-04 ENCOUNTER — Encounter (HOSPITAL_COMMUNITY): Payer: Self-pay | Admitting: Obstetrics and Gynecology

## 2022-02-04 NOTE — Progress Notes (Signed)
Subjective: Postpartum Day 2: Cesarean Delivery Patient reports nausea, incisional pain, tolerating PO, and no problems voiding.    Objective: Vital signs in last 24 hours: Temp:  [98 F (36.7 C)-98.2 F (36.8 C)] 98.1 F (36.7 C) (06/13 0523) Pulse Rate:  [63-69] 69 (06/13 0523) Resp:  [16-18] 16 (06/13 0523) BP: (102-118)/(66-71) 103/71 (06/13 0523) SpO2:  [99 %-100 %] 100 % (06/12 2102)  Physical Exam:  General: alert, cooperative, and mild distress Lochia: appropriate Uterine Fundus: mild abdominal distension Incision: no significant drainage DVT Evaluation: No evidence of DVT seen on physical exam.  Recent Labs    02/02/22 1547 02/03/22 0418  HGB 14.1 11.5*  HCT 41.4 33.8*    Assessment/Plan: Status post Cesarean section.  Pt agrees to try oxycodone now  ; monitor pain Continue current care Abdominal binder  CIrcumcision prior to discharge  .  Carrie [name] 02/04/2022, 11:03 AM

## 2022-02-04 NOTE — Transfer of Care (Signed)
Immediate Anesthesia Transfer of Care Note  Patient: Carrie [name]  Procedure(s) Performed: CESAREAN SECTION  Patient Location: PACU  Anesthesia Type:Spinal  Level of Consciousness: awake, alert  and oriented  Airway & Oxygen Therapy: Patient Spontanous Breathing  Post-op Assessment: Report given to RN and Post -op Vital signs reviewed and stable  Post vital signs: Reviewed and stable  Last Vitals:  Vitals Value Taken Time  BP 103/71 02/04/22 0523  Temp 36.7 C 02/04/22 0523  Pulse 69 02/04/22 0523  Resp 16 02/04/22 0523  SpO2 100 % 02/03/22 2102    Last Pain:  Vitals:   02/04/22 0755  TempSrc:   PainSc: 6       Patients Stated Pain Goal: 0 (02/02/22 1108)  Complications: No notable events documented.

## 2022-02-05 ENCOUNTER — Inpatient Hospital Stay (HOSPITAL_COMMUNITY)
Admission: RE | Admit: 2022-02-05 | Payer: Medicaid Other | Source: Home / Self Care | Admitting: Obstetrics and Gynecology

## 2022-02-05 LAB — COMPREHENSIVE METABOLIC PANEL
ALT: 33 U/L (ref 0–44)
AST: 36 U/L (ref 15–41)
Albumin: 2.4 g/dL — ABNORMAL LOW (ref 3.5–5.0)
Alkaline Phosphatase: 91 U/L (ref 38–126)
Anion gap: 8 (ref 5–15)
BUN: 8 mg/dL (ref 6–20)
CO2: 21 mmol/L — ABNORMAL LOW (ref 22–32)
Calcium: 8.5 mg/dL — ABNORMAL LOW (ref 8.9–10.3)
Chloride: 109 mmol/L (ref 98–111)
Creatinine, Ser: 0.62 mg/dL (ref 0.44–1.00)
GFR, Estimated: >60 mL/min (ref >60)
Glucose, Bld: 85 mg/dL (ref 70–99)
Potassium: 3.8 mmol/L (ref 3.5–5.1)
Sodium: 138 mmol/L (ref 135–145)
Total Bilirubin: 0.5 mg/dL (ref 0.3–1.2)
Total Protein: 5.5 g/dL — ABNORMAL LOW (ref 6.5–8.1)

## 2022-02-05 LAB — CBC WITH DIFFERENTIAL/PLATELET
Abs Immature Granulocytes: 0.04 10*3/uL (ref 0.00–0.07)
Basophils Absolute: 0 10*3/uL (ref 0.0–0.1)
Basophils Relative: 0 %
Eosinophils Absolute: 0.3 10*3/uL (ref 0.0–0.5)
Eosinophils Relative: 3 %
HCT: 33 % — ABNORMAL LOW (ref 36.0–46.0)
Hemoglobin: 11.4 g/dL — ABNORMAL LOW (ref 12.0–15.0)
Immature Granulocytes: 0 %
Lymphocytes Relative: 15 %
Lymphs Abs: 1.4 10*3/uL (ref 0.7–4.0)
MCH: 32.3 pg (ref 26.0–34.0)
MCHC: 34.5 g/dL (ref 30.0–36.0)
MCV: 93.5 fL (ref 80.0–100.0)
Monocytes Absolute: 0.6 10*3/uL (ref 0.1–1.0)
Monocytes Relative: 7 %
Neutro Abs: 7 10*3/uL (ref 1.7–7.7)
Neutrophils Relative %: 75 %
Platelets: 214 10*3/uL (ref 150–400)
RBC: 3.53 MIL/uL — ABNORMAL LOW (ref 3.87–5.11)
RDW: 12.7 % (ref 11.5–15.5)
WBC: 9.4 10*3/uL (ref 4.0–10.5)
nRBC: 0 % (ref 0.0–0.2)

## 2022-02-05 MED ORDER — PANTOPRAZOLE SODIUM 40 MG PO TBEC
40.0000 mg | DELAYED_RELEASE_TABLET | Freq: Every day | ORAL | Status: DC
Start: 2022-02-06 — End: 2022-02-06
  Administered 2022-02-06: 40 mg via ORAL
  Filled 2022-02-05 (×2): qty 1

## 2022-02-05 MED ORDER — FLUOXETINE HCL 20 MG PO CAPS: 40.0000 mg | ORAL_CAPSULE | Freq: Every day | ORAL | 1 refills | Status: DC

## 2022-02-05 MED ORDER — ACETAMINOPHEN 500 MG PO TABS: 1000.0000 mg | ORAL_TABLET | Freq: Four times a day (QID) | ORAL | 0 refills | Status: DC

## 2022-02-05 MED ORDER — ONDANSETRON 4 MG PO TBDP
4.0000 mg | ORAL_TABLET | Freq: Three times a day (TID) | ORAL | Status: DC | PRN
  Administered 2022-02-05: 4 mg via ORAL
  Filled 2022-02-05: qty 1

## 2022-02-05 MED ORDER — OXYCODONE HCL 5 MG PO TABS: 5.0000 mg | ORAL_TABLET | ORAL | 0 refills | Status: DC | PRN

## 2022-02-05 MED ORDER — IBUPROFEN 600 MG PO TABS: 600.0000 mg | ORAL_TABLET | Freq: Four times a day (QID) | ORAL | 0 refills | Status: DC

## 2022-02-05 NOTE — Discharge Summary (Signed)
Postpartum Discharge Summary       Patient Name: Carrie [name] DOB: [birth year] MRN: [id number]  Date of admission: 02/02/2022 Delivery date:02/02/2022  Delivering provider: Jackelyn Knife, TODD  Date of discharge: 02/05/2022  Admitting diagnosis: S/P cesarean section [Z98.891] Intrauterine pregnancy: [redacted]w[redacted]d     Secondary diagnosis:  Principal Problem:   S/P cesarean section  Additional problems: Fetal macrosomia    Discharge diagnosis: Term Pregnancy Delivered                                              Post partum procedures: none Augmentation: N/A Complications: None  Hospital course: Pt had planned C/S that was performed early due to labor.    31 y.o. yo [id number] at [redacted]w[redacted]d was admitted to the hospital 02/02/2022 with contractions and planned cesarean section with the following indication: fetal macrosomia .Delivery details are as follows:  Membrane Rupture Time/Date: 5:44 PM ,02/02/2022   Delivery Method:C-Section, Low Transverse  Details of operation can be found in separate operative note.  Patient had an uncomplicated postpartum course.  Baby had a short NICU stay for respiratory issues, but was able to transition out quickly.  She is ambulating, tolerating a regular diet, passing flatus, and urinating well. Patient is discharged home in stable condition on  02/05/22        Newborn Data: Birth date:02/02/2022  Birth time:5:44 PM  Gender:Female  Living status:Living  Apgars:3 ,6  Weight:4590 g       Physical exam  Vitals:   02/04/22 0523 02/04/22 1421 02/04/22 2045 02/05/22 0615  BP: 103/71 107/64 113/72 104/80  Pulse: 69 65 73 74  Resp: 16 18 17 16   Temp: 98.1 F (36.7 C) 98.4 F (36.9 C) 98.4 F (36.9 C) 97.8 F (36.6 C)  TempSrc: Oral Oral Oral Oral  SpO2:  100% 96% 98%   General: alert and cooperative Lochia: appropriate Uterine Fundus: firm Incision: Dressing is clean, dry, and intact  Labs: Lab Results  Component Value Date   WBC 12.4 (H) 02/03/2022    HGB 11.5 (L) 02/03/2022   HCT 33.8 (L) 02/03/2022   MCV 94.2 02/03/2022   PLT 193 02/03/2022      Latest Ref Rng & Units 11/28/2021   12:23 PM  CMP  Glucose 70 - 99 mg/dL 82   BUN 6 - 20 mg/dL 8   Creatinine 01/28/2022 - 5.39 mg/dL 7.67   Sodium 3.41 - 937 mmol/L 137   Potassium 3.5 - 5.1 mmol/L 3.8   Chloride 98 - 111 mmol/L 113   CO2 22 - 32 mmol/L 21   Calcium 8.9 - 10.3 mg/dL 8.6   Total Protein 6.5 - 8.1 g/dL 5.7   Total Bilirubin 0.3 - 1.2 mg/dL 902   Alkaline Phos 38 - 126 U/L 69   AST 15 - 41 U/L 21   ALT 0 - 44 U/L 19    Edinburgh Score:    02/02/2022    8:45 PM  Edinburgh Postnatal Depression Scale Screening Tool  I have been able to laugh and see the funny side of things. 0  I have looked forward with enjoyment to things. 0  I have blamed myself unnecessarily when things went wrong. 1  I have been anxious or worried for no good reason. 0  I have felt scared or panicky for no good reason. 1  Things  have been getting on top of me. 0  I have been so unhappy that I have had difficulty sleeping. 0  I have felt sad or miserable. 0  I have been so unhappy that I have been crying. 0  The thought of harming myself has occurred to me. 0  Edinburgh Postnatal Depression Scale Total 2     After visit meds:  Allergies as of 02/05/2022       Reactions   Codeine Nausea Only   Raises body temperature        Medication List     STOP taking these medications    COLLAGEN PO       TAKE these medications    acetaminophen 500 MG tablet Commonly known as: TYLENOL Take 2 tablets (1,000 mg total) by mouth every 6 (six) hours.   FLUoxetine 20 MG capsule Commonly known as: PROzac Take 2 capsules (40 mg total) by mouth daily. What changed: how much to take   ibuprofen 600 MG tablet Commonly known as: ADVIL Take 1 tablet (600 mg total) by mouth every 6 (six) hours.   oxyCODONE 5 MG immediate release tablet Commonly known as: Oxy IR/ROXICODONE Take 1-2 tablets (5-10  mg total) by mouth every 4 (four) hours as needed for moderate pain.   PRENATAL VITAMINS PO Take 3 tablets by mouth daily.         Discharge home in stable condition Infant Feeding: Bottle Infant Disposition:home with mother Discharge instruction: per After Visit Summary and Postpartum booklet. Activity: Advance as tolerated. Pelvic rest for 6 weeks.  Diet: routine diet Future Appointments:No future appointments. Follow up Visit:  Follow-up Information     Carrington Clamp, MD Follow up in 2 week(s).   Specialty: Obstetrics and Gynecology Why: Incision check Contact information: [address] [place] [address] [phone]                  Please schedule this patient for a In person postpartum visit in  2 weeks  with the following provider: MD.  Delivery mode:  C-Section, Low Transverse  Anticipated Birth Control:  Unsure   02/05/2022 Oliver Pila, MD

## 2022-02-05 NOTE — Progress Notes (Signed)
Patient ID: Carrie [name], female   DOB: 1991/07/26, 31 y.o.   MRN: [id number] I called nurse to see why patient not d/c at 930pm and she stated pt was have nausea and epigastric pain.  ALso some swelling in her extrremities and blurry vision.   VItals done and BP totally WNL 111/78 pulse 65  Will check stat labs and treat with zofran ODT and protonix.  Cancelling d/c

## 2022-02-05 NOTE — Progress Notes (Signed)
Subjective: Postpartum Day 3: Cesarean Delivery Patient reports tolerating PO, + flatus, and no problems voiding.  Pain much better controlled with prn oxycodone.  She is anxiuos because baby has not passed hearing screen  Objective: Vital signs in last 24 hours: Temp:  [97.8 F (36.6 C)-98.4 F (36.9 C)] 97.8 F (36.6 C) (06/14 0615) Pulse Rate:  [65-74] 74 (06/14 0615) Resp:  [16-18] 16 (06/14 0615) BP: (104-113)/(64-80) 104/80 (06/14 0615) SpO2:  [96 %-100 %] 98 % (06/14 0615)  Physical Exam:  General: alert and cooperative Lochia: appropriate Uterine Fundus: firm Incision: C/D/I DVT Evaluation: No evidence of DVT seen on physical exam.  Recent Labs    02/02/22 1547 02/03/22 0418  HGB 14.1 11.5*  HCT 41.4 33.8*    Assessment/Plan: Status post Cesarean section. Doing well postoperatively. Pt has some baseline anxiety and h/o pp depression.  She is on prozac and increased her dose to 40mg .  She will monitor and if feels not controlled let the office know. D/w pt circumcision risks/benefits and procedure in detail and desired to proceed. Discharge home with standard precautions and return to office in 1-2 weeks for incision check.  02/05/2022, 9:56 AM

## 2022-02-06 MED ORDER — PANTOPRAZOLE SODIUM 40 MG PO TBEC: 40.0000 mg | DELAYED_RELEASE_TABLET | Freq: Every day | ORAL | 1 refills | Status: DC

## 2022-02-06 NOTE — Progress Notes (Signed)
Patient ID: Carrie [name], female   DOB: 07-Jun-1991, 31 y.o.   MRN: [id number] Per nurse, pt felt better and slept after meds and pumping.  Still with some LE swelling.  Labs all WNL.

## 2022-02-06 NOTE — Progress Notes (Signed)
  Patient is eating, ambulating, voiding.  Pain control is good.  Vitals:   02/04/22 1421 02/04/22 2045 02/05/22 0615 02/05/22 2130  BP: 107/64 113/72 104/80 111/78  Pulse: 65 73 74 65  Resp: 18 17 16 18   Temp: 98.4 F (36.9 C) 98.4 F (36.9 C) 97.8 F (36.6 C) 98.1 F (36.7 C)  TempSrc: Oral Oral Oral Oral  SpO2: 100% 96% 98% 99%    lungs:   clear to auscultation cor:    RRR Abdomen:  soft, appropriate tenderness, incisions intact and without erythema or exudate ex:    no cords   Lab Results  Component Value Date   WBC 9.4 02/05/2022   HGB 11.4 (L) 02/05/2022   HCT 33.0 (L) 02/05/2022   MCV 93.5 02/05/2022   PLT 214 02/05/2022    --/--/O POS (06/11 1546)/RI  A/P    Post operative day 4.  Routine post op and postpartum care.  Expect d/c today- d/c delayed yesterday for epigastric discomfort- labs and BPs normal. OK for d/c today.  Oxycodone for pain control.

## 2022-02-06 NOTE — Progress Notes (Signed)
Patient has pumped to relieve severe engorgement. Her breast are feeling better and she plans to pump to relieve pressure but does not intend to breast feed and desires to resume formula feeding. Patient has the hand pump for home to relieve milk/pressure from breast, ice packs for edema, Ibuprofen for discomfort and plans to resume cabbage leaves to facilitate drying up milk. She declines the offer to see a Advertising copywriter, stating she is feeling better now.

## 2022-02-10 ENCOUNTER — Telehealth (HOSPITAL_COMMUNITY): Payer: Self-pay

## 2022-02-10 NOTE — Telephone Encounter (Signed)
"  We are ok. I was having some pain. I called my OB-GYN. I took some gas x and they had me take some omeprazole. I'm feeling better now. Is the incision supposed to be thicker in the center?" RN reviewed incision care and signs of infection to watch for. "My dressing is off, the steri strips are in place still." RN suggested patient call her OB-GYN if she would like her provider to check her incision. Patient has no other questions or concerns about her healing.  "He's doing good. He has a little red splotchy rash that pops up then gets better." RN reviewed what a newborn rash can look like. RN told patient to call her pediatrician if she is concerned. "He sleeps in a bassinet." RN reviewed ABC's of safe sleep with patient. Patient declines any questions or concerns about baby.  EPDS score is 2.  Marcelino Duster [facility] 06/19//2023,1917

## 2022-12-15 ENCOUNTER — Ambulatory Visit (INDEPENDENT_AMBULATORY_CARE_PROVIDER_SITE_OTHER): Payer: Medicaid Other | Admitting: Licensed Clinical Social Worker

## 2022-12-15 ENCOUNTER — Encounter (HOSPITAL_COMMUNITY): Payer: Self-pay | Admitting: Licensed Clinical Social Worker

## 2022-12-15 DIAGNOSIS — F411 Generalized anxiety disorder: Secondary | ICD-10-CM
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
## 2022-12-19 ENCOUNTER — Ambulatory Visit (INDEPENDENT_AMBULATORY_CARE_PROVIDER_SITE_OTHER): Payer: Medicaid Other | Admitting: Psychiatry

## 2022-12-19 ENCOUNTER — Encounter (HOSPITAL_COMMUNITY): Payer: Self-pay | Admitting: Psychiatry

## 2022-12-19 DIAGNOSIS — F502 Bulimia nervosa: Secondary | ICD-10-CM | POA: Diagnosis not present

## 2022-12-19 DIAGNOSIS — F411 Generalized anxiety disorder: Secondary | ICD-10-CM | POA: Diagnosis not present

## 2022-12-19 MED ORDER — SERTRALINE HCL 50 MG PO TABS: ORAL_TABLET | ORAL | 1 refills | Status: DC

## 2022-12-19 NOTE — Patient Instructions (Signed)
Thank you for attending your appointment today.  -- START Zoloft 25 mg daily for 1 week then INCREASE to 50 mg daily -- STOP Prozac at this time -- Continue other medications as prescribed.  Please do not make any changes to medications without first discussing with your provider. If you are experiencing a psychiatric emergency, please call 911 or present to your nearest emergency department. Additional crisis, medication management, and therapy resources are included below.  [facility]  [address] [place], [place] [address] [phone] WALK-IN URGENT CARE 24/7 FOR ANYONE [address] [place], [place]  [phone] Fax: [phone] [email or web address] *Interpreters available *Accepts all insurance and uninsured for Urgent Care needs *Accepts Medicaid and uninsured for outpatient treatment (below)      ONLY FOR [facility]  Below:    Outpatient New Patient Assessment/Therapy Walk-ins:        Monday -Thursday 8am until slots are full.        Every Friday 1pm-4pm  (first come, first served)                   New Patient Psychiatry/Medication Management        Monday-Friday 8am-11am (first come, first served)               For all walk-ins we ask that you arrive by 7:15am, because patients will be seen in the order of arrival.

## 2022-12-22 ENCOUNTER — Other Ambulatory Visit (HOSPITAL_COMMUNITY): Payer: Self-pay | Admitting: Psychiatry

## 2022-12-22 ENCOUNTER — Other Ambulatory Visit (HOSPITAL_COMMUNITY): Payer: Medicaid Other

## 2022-12-22 NOTE — Progress Notes (Signed)
PT PRESENTED TO THE OFFICE FOR LABS TODAY , DRAWN BY Durant Scibilia WITH NO ISSUE OR COMPLAINT .

## 2022-12-23 LAB — COMPREHENSIVE METABOLIC PANEL
ALT: 32 IU/L (ref 0–32)
AST: 28 IU/L (ref 0–40)
Albumin/Globulin Ratio: 1.7 (ref 1.2–2.2)
Albumin: 4.3 g/dL (ref 3.9–4.9)
Alkaline Phosphatase: 76 IU/L (ref 44–121)
BUN/Creatinine Ratio: 15 (ref 9–23)
BUN: 14 mg/dL (ref 6–20)
Bilirubin Total: 0.5 mg/dL (ref 0.0–1.2)
CO2: 21 mmol/L (ref 20–29)
Calcium: 9.3 mg/dL (ref 8.7–10.2)
Chloride: 105 mmol/L (ref 96–106)
Creatinine, Ser: 0.91 mg/dL (ref 0.57–1.00)
Globulin, Total: 2.6 g/dL (ref 1.5–4.5)
Glucose: 75 mg/dL (ref 70–99)
Potassium: 4.8 mmol/L (ref 3.5–5.2)
Sodium: 142 mmol/L (ref 134–144)
Total Protein: 6.9 g/dL (ref 6.0–8.5)
eGFR: 86 mL/min/{1.73_m2} (ref >59)

## 2022-12-23 LAB — CBC WITH DIFFERENTIAL/PLATELET
Basophils Absolute: 0 10*3/uL (ref 0.0–0.2)
Basos: 1 %
EOS (ABSOLUTE): 0.2 10*3/uL (ref 0.0–0.4)
Eos: 5 %
Hematocrit: 45.3 % (ref 34.0–46.6)
Hemoglobin: 14.4 g/dL (ref 11.1–15.9)
Immature Grans (Abs): 0 10*3/uL (ref 0.0–0.1)
Immature Granulocytes: 0 %
Lymphocytes Absolute: 1.6 10*3/uL (ref 0.7–3.1)
Lymphs: 39 %
MCH: 29.8 pg (ref 26.6–33.0)
MCHC: 31.8 g/dL (ref 31.5–35.7)
MCV: 94 fL (ref 79–97)
Monocytes Absolute: 0.4 10*3/uL (ref 0.1–0.9)
Monocytes: 9 %
Neutrophils Absolute: 1.9 10*3/uL (ref 1.4–7.0)
Neutrophils: 46 %
Platelets: 268 10*3/uL (ref 150–450)
RBC: 4.84 x10E6/uL (ref 3.77–5.28)
RDW: 12.3 % (ref 11.7–15.4)
WBC: 4.1 10*3/uL (ref 3.4–10.8)

## 2022-12-23 LAB — VITAMIN D 25 HYDROXY (VIT D DEFICIENCY, FRACTURES): Vit D, 25-Hydroxy: 57.8 ng/mL (ref 30.0–100.0)

## 2022-12-23 LAB — PHOSPHORUS: Phosphorus: 3 mg/dL (ref 3.0–4.3)

## 2022-12-23 LAB — TSH: TSH: 0.907 u[IU]/mL (ref 0.450–4.500)

## 2022-12-23 LAB — MAGNESIUM: Magnesium: 2 mg/dL (ref 1.6–2.3)

## 2022-12-23 LAB — SPECIMEN STATUS REPORT

## 2022-12-24 ENCOUNTER — Ambulatory Visit (HOSPITAL_COMMUNITY): Payer: Medicaid Other | Admitting: Psychiatry
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
## 2023-01-26 ENCOUNTER — Encounter (HOSPITAL_COMMUNITY): Payer: Self-pay | Admitting: Psychiatry

## 2023-01-26 ENCOUNTER — Telehealth (INDEPENDENT_AMBULATORY_CARE_PROVIDER_SITE_OTHER): Payer: Medicaid Other | Admitting: Psychiatry

## 2023-01-26 DIAGNOSIS — F502 Bulimia nervosa: Secondary | ICD-10-CM

## 2023-01-26 DIAGNOSIS — F411 Generalized anxiety disorder: Secondary | ICD-10-CM

## 2023-01-26 MED ORDER — SERTRALINE HCL 100 MG PO TABS
100.0000 mg | ORAL_TABLET | Freq: Every day | ORAL | 2 refills | Status: DC
Start: 2023-01-26 — End: 2023-02-11

## 2023-01-26 MED ORDER — VITAMIN D (CHOLECALCIFEROL) 50 MCG (2000 UT) PO CAPS: 2000.0000 [IU] | ORAL_CAPSULE | Freq: Every morning | ORAL | 5 refills | Status: AC

## 2023-01-26 NOTE — Patient Instructions (Signed)
Thank you for attending your appointment today.  -- INCREASE Zoloft to 100 mg daily -- Continue other medications as prescribed.  Please do not make any changes to medications without first discussing with your provider. If you are experiencing a psychiatric emergency, please call 911 or present to your nearest emergency department. Additional crisis, medication management, and therapy resources are included below.  [facility]  [address], [place], [place] [address] [phone] WALK-IN URGENT CARE 24/7 FOR ANYONE [address], [place], [place]  [phone] Fax: [phone] [email or web address] *Interpreters available *Accepts all insurance and uninsured for Urgent Care needs *Accepts Medicaid and uninsured for outpatient treatment (below)      ONLY FOR [place] Residents  Below:    Outpatient New Patient Assessment/Therapy Walk-ins:        Monday -Thursday 8am until slots are full.        Every Friday 1pm-4pm  (first come, first served)                   New Patient Psychiatry/Medication Management        Monday-Friday 8am-11am (first come, first served)               For all walk-ins we ask that you arrive by 7:15am, because patients will be seen in the order of arrival.   

## 2023-01-30 ENCOUNTER — Ambulatory Visit (HOSPITAL_COMMUNITY): Payer: Medicaid Other | Admitting: Mental Health

## 2023-02-09 ENCOUNTER — Telehealth (HOSPITAL_COMMUNITY): Payer: Self-pay | Admitting: Psychiatry

## 2023-02-11 ENCOUNTER — Telehealth (HOSPITAL_COMMUNITY): Payer: Self-pay | Admitting: Psychiatry

## 2023-02-11 MED ORDER — FLUOXETINE HCL 20 MG PO CAPS: ORAL_CAPSULE | ORAL | 2 refills | Status: DC

## 2023-02-11 NOTE — Telephone Encounter (Signed)
This Clinical research associate called patient after receiving message from front desk regarding medication issue. Spoke with patient for approx. 10 min phone call: She reports she thought Zoloft was initially working great however lately has been making her more tired and irritated. Reports worsening of binge/purging behaviors as well since being on Zoloft. Denies SI or self harm thoughts. Stopped taking Zoloft about 3 days ago and restarted Prozac 40 mg daily from old script. Feels that Prozac had been better fit for her as she felt more energized on this medication and bulimia symptoms were well controlled.   Discussed medication options including transition back to Prozac vs trial of alternative agent. Patient would like to retrial Prozac at this time.  Plan: -- Patient has already stopped Zoloft 100 mg daily -- START Prozac 20 mg daily for 1 week then increase to 40 mg daily thereafter  Daine Gip, MD 02/11/23

## 2023-03-09 ENCOUNTER — Ambulatory Visit (HOSPITAL_COMMUNITY): Payer: Medicaid Other | Admitting: Mental Health

## 2023-03-09 ENCOUNTER — Encounter (HOSPITAL_COMMUNITY): Payer: Self-pay

## 2023-03-09 ENCOUNTER — Telehealth (HOSPITAL_COMMUNITY): Payer: Self-pay | Admitting: Mental Health

## 2023-03-09 NOTE — Telephone Encounter (Signed)
Therapist sent link for tele-therapy session x 2. No response. Contacted pt via telephone who stated was driving and forgot about appointment although states to have received reminder messages/calls for appointment. Request to reschedule and then inquired if OPT is mandatory. Therapist explained of services to be voluntary. Pt denied desire to be rescheduled and request medication management services only.
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
## 2023-03-23 ENCOUNTER — Encounter (HOSPITAL_COMMUNITY): Payer: Self-pay | Admitting: Psychiatry

## 2023-03-23 ENCOUNTER — Telehealth (INDEPENDENT_AMBULATORY_CARE_PROVIDER_SITE_OTHER): Payer: Medicaid Other | Admitting: Psychiatry

## 2023-03-23 DIAGNOSIS — F502 Bulimia nervosa: Secondary | ICD-10-CM | POA: Diagnosis not present

## 2023-03-23 DIAGNOSIS — F411 Generalized anxiety disorder: Secondary | ICD-10-CM | POA: Diagnosis not present

## 2023-03-23 DIAGNOSIS — F53 Postpartum depression: Secondary | ICD-10-CM

## 2023-03-23 MED ORDER — FLUOXETINE HCL 40 MG PO CAPS
40.0000 mg | ORAL_CAPSULE | Freq: Every day | ORAL | 2 refills | Status: AC
Start: 2023-03-23 — End: 2023-06-21

## 2023-03-23 NOTE — Patient Instructions (Signed)
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
## 2023-05-22 ENCOUNTER — Telehealth (INDEPENDENT_AMBULATORY_CARE_PROVIDER_SITE_OTHER): Payer: Medicaid Other | Admitting: Psychiatry

## 2023-05-22 ENCOUNTER — Encounter (HOSPITAL_COMMUNITY): Payer: Self-pay | Admitting: Psychiatry

## 2023-05-22 DIAGNOSIS — F502 Bulimia nervosa: Secondary | ICD-10-CM

## 2023-05-22 DIAGNOSIS — F411 Generalized anxiety disorder: Secondary | ICD-10-CM | POA: Diagnosis not present

## 2023-05-22 MED ORDER — FLUOXETINE HCL 40 MG PO CAPS: 40.0000 mg | ORAL_CAPSULE | Freq: Every day | ORAL | 3 refills | Status: DC

## 2023-05-22 NOTE — Patient Instructions (Signed)
Thank you for attending your appointment today.  -- We did not make any medication changes today. Please continue medications as prescribed.  Please do not make any changes to medications without first discussing with your provider. If you are experiencing a psychiatric emergency, please call 911 or present to your nearest emergency department. Additional crisis, medication management, and therapy resources are included below.  [facility]  [address], [place], [place] [address] [phone] WALK-IN URGENT CARE 24/7 FOR ANYONE [address], [place], [place]  [phone] Fax: [phone] [email or web address] *Interpreters available *Accepts all insurance and uninsured for Urgent Care needs *Accepts Medicaid and uninsured for outpatient treatment (below)      ONLY FOR [place] Residents  Below:    Outpatient New Patient Assessment/Therapy Walk-ins:        Monday -Thursday 8am until slots are full.        Every Friday 1pm-4pm  (first come, first served)                   New Patient Psychiatry/Medication Management        Monday-Friday 8am-11am (first come, first served)               For all walk-ins we ask that you arrive by 7:15am, because patients will be seen in the order of arrival.   

## 2023-08-14 ENCOUNTER — Telehealth (HOSPITAL_COMMUNITY): Payer: Medicaid Other | Admitting: Psychiatry
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
## 2023-08-28 ENCOUNTER — Telehealth (HOSPITAL_COMMUNITY): Payer: Medicaid Other | Admitting: Psychiatry

## 2023-08-28 ENCOUNTER — Encounter (HOSPITAL_COMMUNITY): Payer: Self-pay | Admitting: Psychiatry

## 2023-08-28 DIAGNOSIS — F411 Generalized anxiety disorder: Secondary | ICD-10-CM | POA: Diagnosis not present

## 2023-08-28 DIAGNOSIS — F502 Bulimia nervosa, unspecified: Secondary | ICD-10-CM

## 2023-08-28 DIAGNOSIS — Z8659 Personal history of other mental and behavioral disorders: Secondary | ICD-10-CM

## 2023-08-28 MED ORDER — FLUOXETINE HCL 40 MG PO CAPS: 40.0000 mg | ORAL_CAPSULE | Freq: Every day | ORAL | 3 refills | Status: DC

## 2023-08-28 NOTE — Patient Instructions (Signed)

## 2023-10-15 ENCOUNTER — Emergency Department (HOSPITAL_COMMUNITY)
Admission: EM | Admit: 2023-10-15 | Discharge: 2023-10-16 | Disposition: A | Discharge status: Home / Self Care | Payer: Medicaid Other

## 2023-10-15 ENCOUNTER — Encounter (HOSPITAL_COMMUNITY): Payer: Self-pay | Admitting: Emergency Medicine

## 2023-10-15 DIAGNOSIS — M79605 Pain in left leg: Secondary | ICD-10-CM | POA: Diagnosis not present

## 2023-10-15 DIAGNOSIS — M79604 Pain in right leg: Secondary | ICD-10-CM | POA: Diagnosis not present

## 2023-10-15 DIAGNOSIS — R1030 Lower abdominal pain, unspecified: Secondary | ICD-10-CM | POA: Diagnosis present

## 2023-10-15 DIAGNOSIS — N939 Abnormal uterine and vaginal bleeding, unspecified: Secondary | ICD-10-CM | POA: Diagnosis not present

## 2023-10-15 LAB — COMPREHENSIVE METABOLIC PANEL
ALT: 24 U/L (ref 0–44)
AST: 24 U/L (ref 15–41)
Albumin: 4 g/dL (ref 3.5–5.0)
Alkaline Phosphatase: 58 U/L (ref 38–126)
Anion gap: 8 (ref 5–15)
BUN: 14 mg/dL (ref 6–20)
CO2: 24 mmol/L (ref 22–32)
Calcium: 9.4 mg/dL (ref 8.9–10.3)
Chloride: 106 mmol/L (ref 98–111)
Creatinine, Ser: 0.66 mg/dL (ref 0.44–1.00)
GFR, Estimated: >60 mL/min (ref >60)
Glucose, Bld: 93 mg/dL (ref 70–99)
Potassium: 4 mmol/L (ref 3.5–5.1)
Sodium: 138 mmol/L (ref 135–145)
Total Bilirubin: 0.6 mg/dL (ref 0.0–1.2)
Total Protein: 7.4 g/dL (ref 6.5–8.1)

## 2023-10-15 LAB — URINALYSIS, ROUTINE W REFLEX MICROSCOPIC
Bilirubin Urine: NEGATIVE
Glucose, UA: NEGATIVE mg/dL
Hgb urine dipstick: NEGATIVE
Ketones, ur: NEGATIVE mg/dL
Leukocytes,Ua: NEGATIVE
Nitrite: NEGATIVE
Protein, ur: NEGATIVE mg/dL
Specific Gravity, Urine: 1.006 (ref 1.005–1.030)
pH: 7 (ref 5.0–8.0)

## 2023-10-15 LAB — CBC
HCT: 42.4 % (ref 36.0–46.0)
Hemoglobin: 13.9 g/dL (ref 12.0–15.0)
MCH: 30 pg (ref 26.0–34.0)
MCHC: 32.8 g/dL (ref 30.0–36.0)
MCV: 91.4 fL (ref 80.0–100.0)
Platelets: 275 10*3/uL (ref 150–400)
RBC: 4.64 MIL/uL (ref 3.87–5.11)
RDW: 12.5 % (ref 11.5–15.5)
WBC: 5 10*3/uL (ref 4.0–10.5)
nRBC: 0 % (ref 0.0–0.2)

## 2023-10-15 LAB — HCG, SERUM, QUALITATIVE: Preg, Serum: NEGATIVE

## 2023-10-15 MED ORDER — IBUPROFEN 600 MG PO TABS: 600.0000 mg | ORAL_TABLET | Freq: Four times a day (QID) | ORAL | 0 refills | Status: AC | PRN

## 2023-10-15 MED ORDER — CYCLOBENZAPRINE HCL 5 MG PO TABS: 5.0000 mg | ORAL_TABLET | Freq: Two times a day (BID) | ORAL | 0 refills | Status: AC | PRN

## 2023-10-15 NOTE — ED Provider Notes (Signed)
[place] EMERGENCY DEPARTMENT AT [facility] Provider Note   CSN: [id number] Arrival date & time: 10/15/23  1233     History  Chief Complaint  Patient presents with   Vaginal Bleeding   Leg Pain    Carrie [name] is a 33 y.o. female.  The history is provided by the patient and medical records. No language interpreter was used.  Vaginal Bleeding Leg Pain    33 year old female history of anxiety, depression, presenting with multiple complaints.  Patient report gradual onset of lower abdominal pain that radiates down to both legs since yesterday.  Pain is moderate in severity, worse when she lays down and improves when she takes an Epsom salt bath.  She denies any associated fever or chills no nausea vomiting or diarrhea no constipation no chest pain or shortness of breath no urinary discomfort.  She did notice some vaginal spotting which is new.  She is normally very regular with her menstruation.  She has history of ectopic pregnancy in the past.  She denies any heavy lifting or strenuous activities.  No recent medication changes.  No new sexual partner and no prior history of STI.  Denies any pain with sexual activities.  Home Medications Prior to Admission medications   Medication Sig Start Date End Date Taking? Authorizing Provider  Cholecalciferol (VITAMIN D3) 50 MCG (2000 UT) CAPS Take 1 capsule (2,000 Units total) by mouth in the morning. 01/26/23   Bahraini, Sarah A  FLUoxetine (PROZAC) 40 MG capsule Take 1 capsule (40 mg total) by mouth daily. 08/28/23 12/26/23  Bahraini, Sarah A  Prenatal Vit-Fe Fumarate-FA (PRENATAL VITAMINS PO) Take 3 tablets by mouth daily.    [provider]      Allergies    Codeine    Review of Systems   Review of Systems  Genitourinary:  Positive for vaginal bleeding.  All other systems reviewed and are negative.   Physical Exam Updated Vital Signs BP 109/80 (BP Location: Right Arm)   Pulse 71   Temp 98.5 F (36.9 C) (Oral)    Resp 15   Ht 5\' 5"  (1.651 m)   Wt 79.4 kg   LMP 10/08/2023 (Approximate)   SpO2 99%   BMI 29.12 kg/m  Physical Exam Vitals and nursing note reviewed.  Constitutional:      General: She is not in acute distress.    Appearance: She is well-developed.  HENT:     Head: Atraumatic.  Eyes:     Conjunctiva/sclera: Conjunctivae normal.  Cardiovascular:     Rate and Rhythm: Normal rate and regular rhythm.     Pulses: Normal pulses.     Heart sounds: Normal heart sounds.  Pulmonary:     Effort: Pulmonary effort is normal.  Abdominal:     Palpations: Abdomen is soft.     Tenderness: There is abdominal tenderness (Mild suprapubic tenderness no guarding no rebound tenderness). There is no right CVA tenderness or left CVA tenderness.  Musculoskeletal:     Cervical back: Neck supple.  Skin:    General: Skin is warm.     Capillary Refill: Capillary refill takes less than 2 seconds.     Findings: No rash.  Neurological:     Mental Status: She is alert.  Psychiatric:        Mood and Affect: Mood normal.     ED Results / Procedures / Treatments   Labs (all labs ordered are listed, but only abnormal results are displayed) Labs Reviewed  URINALYSIS, ROUTINE W REFLEX MICROSCOPIC - Abnormal; Notable for the following components:      Result Value   APPearance HAZY (*)    All other components within normal limits  HCG, SERUM, QUALITATIVE  CBC  COMPREHENSIVE METABOLIC PANEL  You have any  EKG None  Radiology No results found.  Procedures Procedures    Medications Ordered in ED Medications - No data to display  ED Course/ Medical Decision Making/ A&P                                 Medical Decision Making Amount and/or Complexity of Data Reviewed Labs: ordered.   BP 109/80 (BP Location: Right Arm)   Pulse 71   Temp 98.5 F (36.9 C) (Oral)   Resp 15   Ht 5\' 5"  (1.651 m)   Wt 79.4 kg   LMP 10/08/2023 (Approximate)   SpO2 99%   BMI 29.12 kg/m   51:65  PM 33 year old female history of anxiety, depression, presenting with multiple complaints.  Patient report gradual onset of lower abdominal pain that radiates down to both legs since yesterday.  Pain is moderate in severity, worse when she lays down and improves when she takes an Epsom salt bath.  She denies any associated fever or chills no nausea vomiting or diarrhea no constipation no chest pain or shortness of breath no urinary discomfort.  She did notice some vaginal spotting which is new.  She is normally very regular with her menstruation.  She has history of ectopic pregnancy in the past.  She denies any heavy lifting or strenuous activities.  No recent medication changes.  No new sexual partner and no prior history of STI.  Denies any pain with sexual activities.  On exam this is a well-appearing female appears to be in no acute discomfort.  She is sitting up in bed resting comfortably.  She has very minimal suprapubic tenderness without any guarding rebound tenderness.  She does not have any CVA tenderness.  She has full range of motion about her legs and she is neurovascularly intact.  -Labs ordered, independently viewed and interpreted by me.  Labs remarkable for reassuring labs.  -The patient was maintained on a cardiac monitor.  I personally viewed and interpreted the cardiac monitored which showed an underlying rhythm of: NSR -Imaging including abd/pelvis CT or transvaginal US were considered and offered but through shared decision making we did not performed due to low suspicion of acute emergent condition -This patient presents to the ED for concern of abd pain, this involves an extensive number of treatment options, and is a complaint that carries with it a high risk of complications and morbidity.  The differential diagnosis includes ectopic pregnancy, TOA, ovarian torsion, vaginal tear, PID, appendicitis, colitis, caudal equina, UTI, kidney stone, MSK -Co morbidities that complicate the  patient evaluation includes anxiety -Treatment includes reassurance -Reevaluation of the patient after these medicines showed that the patient stayed the same -PCP office notes or outside notes reviewed -Escalation to admission/observation considered: patients feels much better, is comfortable with discharge, and will follow up with PCP -Prescription medication considered, patient comfortable with ibuprofen, flexeril -Social Determinant of Health considered          Final Clinical Impression(s) / ED Diagnoses Final diagnoses:  Abnormal vaginal bleeding  Lower abdominal pain    Rx / DC Orders ED Discharge Orders  Ordered    ibuprofen (ADVIL) 600 MG tablet  Every 6 hours PRN        10/15/23 1614    cyclobenzaprine (FLEXERIL) 5 MG tablet  2 times daily PRN        10/15/23 1614              Fayrene Helper, PA-C 10/15/23 1615    Durwin Glaze, MD 10/15/23 [phone]

## 2023-10-15 NOTE — ED Triage Notes (Signed)
Pt here from home with c/o bil leg pain starts at the hips and radiates down to her feet , also started having some vag bleeding , spotting no clots

## 2023-10-15 NOTE — Discharge Instructions (Signed)
You have been evaluated for your symptoms.  Fortunately your blood work today did not show any concerning finding.  If you develop worsening vaginal bleeding, fever, worsening abdominal pain, or if you have any other concern please return to the ER for reassessment.
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
## 2023-11-04 ENCOUNTER — Encounter (HOSPITAL_COMMUNITY): Payer: Self-pay | Admitting: Psychiatry

## 2023-11-04 ENCOUNTER — Telehealth (INDEPENDENT_AMBULATORY_CARE_PROVIDER_SITE_OTHER): Payer: Medicaid Other | Admitting: Psychiatry

## 2023-11-04 DIAGNOSIS — Z8759 Personal history of other complications of pregnancy, childbirth and the puerperium: Secondary | ICD-10-CM

## 2023-11-04 DIAGNOSIS — Z8659 Personal history of other mental and behavioral disorders: Secondary | ICD-10-CM | POA: Diagnosis not present

## 2023-11-04 DIAGNOSIS — F411 Generalized anxiety disorder: Secondary | ICD-10-CM | POA: Diagnosis not present

## 2023-11-04 DIAGNOSIS — F502 Bulimia nervosa, unspecified: Secondary | ICD-10-CM

## 2023-11-04 MED ORDER — FLUOXETINE HCL 20 MG PO CAPS: 20.0000 mg | ORAL_CAPSULE | Freq: Every day | ORAL | 3 refills | Status: DC

## 2023-11-04 MED ORDER — FLUOXETINE HCL 40 MG PO CAPS
40.0000 mg | ORAL_CAPSULE | Freq: Every day | ORAL | 3 refills | Status: DC
Start: 2023-11-04 — End: 2024-02-24

## 2023-11-04 NOTE — Patient Instructions (Addendum)
 Thank you for attending your appointment today.  -- We did not make any medication changes today. Please continue medications as prescribed. Continue taking Prozac 60 mg daily.  Please do not make any changes to medications without first discussing with your provider. If you are experiencing a psychiatric emergency, please call 911 or present to your nearest emergency department. Additional crisis, medication management, and therapy resources are included below.  [facility]  [address], [place], [place] [address] [phone] WALK-IN URGENT CARE 24/7 FOR ANYONE [address], [place], [place]  [phone] Fax: [phone] [email or web address] *Interpreters available *Accepts all insurance and uninsured for Urgent Care needs *Accepts Medicaid and uninsured for outpatient treatment (below)      ONLY FOR [facility]  Below:    Outpatient New Patient Assessment/Therapy Walk-ins:        Monday, Wednesday, and Thursday 8am until slots are full (first come, first served)                   New Patient Psychiatry/Medication Management        Monday-Friday 8am-11am (first come, first served)               For all walk-ins we ask that you arrive by 7:15am, because patients will be seen in the order of arrival.
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
## 2024-02-03 ENCOUNTER — Telehealth (HOSPITAL_COMMUNITY): Admitting: Psychiatry

## 2024-02-18 NOTE — Progress Notes (Signed)
 Patient did not connect for virtual psychiatric medication management appointment on 02/22/24 at 11:30AM. Sent secure video link with no response. Left VM with callback number to reschedule.  LAURAINE DELENA PUMMEL, MD 02/22/24

## 2024-02-22 ENCOUNTER — Encounter (HOSPITAL_COMMUNITY): Admitting: Psychiatry

## 2024-02-22 ENCOUNTER — Encounter (HOSPITAL_COMMUNITY): Payer: Self-pay

## 2024-02-24 ENCOUNTER — Encounter (HOSPITAL_COMMUNITY): Payer: Self-pay | Admitting: Psychiatry

## 2024-02-24 ENCOUNTER — Telehealth (INDEPENDENT_AMBULATORY_CARE_PROVIDER_SITE_OTHER): Admitting: Psychiatry

## 2024-02-24 DIAGNOSIS — F5021 Bulimia nervosa, mild: Secondary | ICD-10-CM

## 2024-02-24 DIAGNOSIS — Z8759 Personal history of other complications of pregnancy, childbirth and the puerperium: Secondary | ICD-10-CM | POA: Diagnosis not present

## 2024-02-24 DIAGNOSIS — F411 Generalized anxiety disorder: Secondary | ICD-10-CM

## 2024-02-24 DIAGNOSIS — Z8659 Personal history of other mental and behavioral disorders: Secondary | ICD-10-CM | POA: Diagnosis not present

## 2024-02-24 MED ORDER — FLUOXETINE HCL 40 MG PO CAPS: 40.0000 mg | ORAL_CAPSULE | Freq: Every day | ORAL | 1 refills | Status: DC

## 2024-02-24 MED ORDER — FLUOXETINE HCL 20 MG PO CAPS: 20.0000 mg | ORAL_CAPSULE | Freq: Every day | ORAL | 1 refills | Status: DC
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
## 2024-02-24 NOTE — Patient Instructions (Addendum)
 Thank you for attending your appointment today.  -- INCREASE Prozac  back to 60 mg daily -- Continue other medications as prescribed. -- Here is the psychologytoday.com page for therapists specializing in eating disorders who accept Medicaid:  [email or web address]/[place] [email or web address]  -- Please contact your medicaid case manager by calling the number on your card to obtain a list of in-network programs.   Please do not make any changes to medications without first discussing with your provider. If you are experiencing a psychiatric emergency, please call 911 or present to your nearest emergency department. Additional crisis, medication management, and therapy resources are included below.  [facility]  [address], [place], [place] [address] [phone] WALK-IN URGENT CARE 24/7 FOR ANYONE [address] [place], [place]  [phone] Fax: [phone] [email or web address] *Interpreters available *Accepts all insurance and uninsured for Urgent Care needs *Accepts Medicaid and uninsured for outpatient treatment (below)      ONLY FOR [facility]  Below:    Outpatient New Patient Assessment/Therapy Walk-ins:        Monday, Wednesday, and Thursday 8am until slots are full (first come, first served)                   New Patient Psychiatry/Medication Management        Monday-Friday 8am-11am (first come, first served)               For all walk-ins we ask that you arrive by 7:15am, because patients will be seen in the order of arrival.
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
## 2024-03-30 ENCOUNTER — Encounter (HOSPITAL_COMMUNITY): Payer: Self-pay | Admitting: Psychiatry

## 2024-03-30 ENCOUNTER — Telehealth (HOSPITAL_COMMUNITY): Admitting: Psychiatry

## 2024-03-30 DIAGNOSIS — F411 Generalized anxiety disorder: Secondary | ICD-10-CM | POA: Diagnosis not present

## 2024-03-30 DIAGNOSIS — F5021 Bulimia nervosa, mild: Secondary | ICD-10-CM | POA: Diagnosis not present

## 2024-03-30 DIAGNOSIS — Z8659 Personal history of other mental and behavioral disorders: Secondary | ICD-10-CM

## 2024-03-30 DIAGNOSIS — Z8759 Personal history of other complications of pregnancy, childbirth and the puerperium: Secondary | ICD-10-CM | POA: Diagnosis not present

## 2024-03-30 MED ORDER — FLUOXETINE HCL 20 MG PO CAPS: 20.0000 mg | ORAL_CAPSULE | Freq: Every day | ORAL | 2 refills | Status: DC

## 2024-03-30 MED ORDER — FLUOXETINE HCL 40 MG PO CAPS: 40.0000 mg | ORAL_CAPSULE | Freq: Every day | ORAL | 2 refills | Status: DC

## 2024-03-30 MED ORDER — PROPRANOLOL HCL 10 MG PO TABS: 10.0000 mg | ORAL_TABLET | Freq: Every day | ORAL | 1 refills | Status: DC | PRN

## 2024-03-30 NOTE — Patient Instructions (Signed)
 Thank you for attending your appointment today.  -- START propranolol  10 mg daily as needed for acute anxiety -- Continue other medications as prescribed.  Please do not make any changes to medications without first discussing with your provider. If you are experiencing a psychiatric emergency, please call 911 or present to your nearest emergency department. Additional crisis, medication management, and therapy resources are included below.  [facility]  [address], [place], [place] [address] [phone] WALK-IN URGENT CARE 24/7 FOR ANYONE [address] [place], [place]  [phone] Fax: [phone] [email or web address] *Interpreters available *Accepts all insurance and uninsured for Urgent Care needs *Accepts Medicaid and uninsured for outpatient treatment (below)      ONLY FOR [facility]  Below:    Outpatient New Patient Assessment/Therapy Walk-ins:        Monday, Wednesday, and Thursday 8am until slots are full (first come, first served)                   New Patient Psychiatry/Medication Management        Monday-Friday 8am-11am (first come, first served)               For all walk-ins we ask that you arrive by 7:15am, because patients will be seen in the order of arrival.
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
## 2024-06-06 ENCOUNTER — Telehealth (HOSPITAL_COMMUNITY): Admitting: Psychiatry

## 2024-06-06 ENCOUNTER — Encounter (HOSPITAL_COMMUNITY): Payer: Self-pay | Admitting: Psychiatry

## 2024-06-06 DIAGNOSIS — F411 Generalized anxiety disorder: Secondary | ICD-10-CM | POA: Diagnosis not present

## 2024-06-06 DIAGNOSIS — Z8759 Personal history of other complications of pregnancy, childbirth and the puerperium: Secondary | ICD-10-CM | POA: Diagnosis not present

## 2024-06-06 DIAGNOSIS — Z8659 Personal history of other mental and behavioral disorders: Secondary | ICD-10-CM

## 2024-06-06 DIAGNOSIS — F5021 Bulimia nervosa, mild: Secondary | ICD-10-CM | POA: Diagnosis not present

## 2024-06-06 DIAGNOSIS — Z9189 Other specified personal risk factors, not elsewhere classified: Secondary | ICD-10-CM

## 2024-06-06 MED ORDER — FLUOXETINE HCL 40 MG PO CAPS: 40.0000 mg | ORAL_CAPSULE | Freq: Every day | ORAL | 3 refills | Status: DC

## 2024-06-06 MED ORDER — FLUOXETINE HCL 20 MG PO CAPS: 20.0000 mg | ORAL_CAPSULE | Freq: Every day | ORAL | 3 refills | Status: DC

## 2024-06-06 NOTE — Patient Instructions (Signed)

## 2024-07-07 ENCOUNTER — Institutional Professional Consult (permissible substitution): Admitting: Neurology

## 2024-08-08 ENCOUNTER — Telehealth (INDEPENDENT_AMBULATORY_CARE_PROVIDER_SITE_OTHER): Admitting: Psychiatry

## 2024-08-08 ENCOUNTER — Encounter (HOSPITAL_COMMUNITY): Payer: Self-pay | Admitting: Psychiatry

## 2024-08-08 DIAGNOSIS — F5021 Bulimia nervosa, mild: Secondary | ICD-10-CM

## 2024-08-08 DIAGNOSIS — F411 Generalized anxiety disorder: Secondary | ICD-10-CM

## 2024-08-08 MED ORDER — FLUOXETINE HCL 40 MG PO CAPS: 40.0000 mg | ORAL_CAPSULE | Freq: Every day | ORAL | 3 refills | Status: AC

## 2024-08-08 MED ORDER — PROPRANOLOL HCL 10 MG PO TABS: 10.0000 mg | ORAL_TABLET | Freq: Every day | ORAL | 1 refills | Status: AC | PRN

## 2024-08-08 MED ORDER — FLUOXETINE HCL 20 MG PO CAPS: 20.0000 mg | ORAL_CAPSULE | Freq: Every day | ORAL | 3 refills | Status: AC
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
## 2024-08-12 ENCOUNTER — Telehealth (HOSPITAL_COMMUNITY): Admitting: Psychiatry

## 2024-10-12 ENCOUNTER — Telehealth (HOSPITAL_COMMUNITY): Admitting: Psychiatry
